# Patient Record
Sex: Male | Born: 1945 | Race: Black or African American | Hispanic: No
Health system: Southern US, Community
[De-identification: ages and names within clinical notes are randomized; demographics above are authoritative.]

## PROBLEM LIST (undated history)

## (undated) DIAGNOSIS — I10 Essential (primary) hypertension: Secondary | ICD-10-CM

## (undated) DIAGNOSIS — E119 Type 2 diabetes mellitus without complications: Secondary | ICD-10-CM

## (undated) DIAGNOSIS — E785 Hyperlipidemia, unspecified: Secondary | ICD-10-CM

## (undated) DIAGNOSIS — K219 Gastro-esophageal reflux disease without esophagitis: Secondary | ICD-10-CM

## (undated) DIAGNOSIS — M1990 Unspecified osteoarthritis, unspecified site: Secondary | ICD-10-CM

## (undated) HISTORY — PX: REPLACEMENT TOTAL KNEE BILATERAL: SUR1225

## (undated) HISTORY — PX: HERNIA REPAIR: SHX51

## (undated) HISTORY — PX: TOTAL KNEE ARTHROPLASTY: SHX125

---

## 1979-08-19 HISTORY — PX: BACK SURGERY: SHX140

## 1999-03-04 ENCOUNTER — Ambulatory Visit (HOSPITAL_BASED_OUTPATIENT_CLINIC_OR_DEPARTMENT_OTHER): Admission: RE | Admit: 1999-03-04 | Discharge: 1999-03-04 | Payer: Self-pay | Admitting: *Deleted

## 2001-08-28 ENCOUNTER — Encounter: Admission: RE | Admit: 2001-08-28 | Discharge: 2001-11-26 | Payer: Self-pay | Admitting: Family Medicine

## 2002-12-24 ENCOUNTER — Encounter: Payer: Self-pay | Admitting: Orthopedic Surgery

## 2002-12-30 ENCOUNTER — Inpatient Hospital Stay (HOSPITAL_COMMUNITY): Admission: RE | Admit: 2002-12-30 | Discharge: 2003-01-05 | Payer: Self-pay | Admitting: Orthopedic Surgery

## 2002-12-30 ENCOUNTER — Encounter: Payer: Self-pay | Admitting: Orthopedic Surgery

## 2010-12-14 ENCOUNTER — Ambulatory Visit (HOSPITAL_COMMUNITY)
Admission: RE | Admit: 2010-12-14 | Discharge: 2010-12-14 | Payer: Self-pay | Source: Home / Self Care | Attending: Family Medicine | Admitting: Family Medicine

## 2010-12-14 ENCOUNTER — Ambulatory Visit (HOSPITAL_COMMUNITY): Admission: RE | Admit: 2010-12-14 | Payer: Self-pay | Source: Home / Self Care | Admitting: Family Medicine

## 2011-01-17 ENCOUNTER — Encounter
Admission: RE | Admit: 2011-01-17 | Discharge: 2011-01-17 | Payer: Self-pay | Source: Home / Self Care | Attending: Family Medicine | Admitting: Family Medicine

## 2011-02-24 ENCOUNTER — Ambulatory Visit (HOSPITAL_COMMUNITY)
Admission: RE | Admit: 2011-02-24 | Discharge: 2011-02-24 | Disposition: A | Discharge status: Home / Self Care | Payer: Medicare Other | Source: Ambulatory Visit | Attending: Orthopedic Surgery | Admitting: Orthopedic Surgery

## 2011-02-24 ENCOUNTER — Other Ambulatory Visit: Payer: Self-pay | Admitting: Orthopedic Surgery

## 2011-02-24 ENCOUNTER — Encounter (HOSPITAL_COMMUNITY)
Admission: RE | Admit: 2011-02-24 | Discharge: 2011-02-24 | Disposition: A | Discharge status: Home / Self Care | Payer: Medicare Other | Source: Ambulatory Visit | Attending: Orthopedic Surgery | Admitting: Orthopedic Surgery

## 2011-02-24 DIAGNOSIS — M1712 Unilateral primary osteoarthritis, left knee: Secondary | ICD-10-CM

## 2011-02-24 DIAGNOSIS — Z01812 Encounter for preprocedural laboratory examination: Secondary | ICD-10-CM | POA: Insufficient documentation

## 2011-02-24 DIAGNOSIS — Z01818 Encounter for other preprocedural examination: Secondary | ICD-10-CM | POA: Insufficient documentation

## 2011-02-24 DIAGNOSIS — I10 Essential (primary) hypertension: Secondary | ICD-10-CM | POA: Insufficient documentation

## 2011-02-24 DIAGNOSIS — M1710 Unilateral primary osteoarthritis, unspecified knee: Secondary | ICD-10-CM | POA: Insufficient documentation

## 2011-02-24 DIAGNOSIS — IMO0002 Reserved for concepts with insufficient information to code with codable children: Secondary | ICD-10-CM | POA: Insufficient documentation

## 2011-02-24 LAB — URINALYSIS, ROUTINE W REFLEX MICROSCOPIC
Glucose, UA: 250 mg/dL — AB
Ketones, ur: NEGATIVE mg/dL
Nitrite: NEGATIVE
Specific Gravity, Urine: 1.014 (ref 1.005–1.030)
pH: 5.5 (ref 5.0–8.0)

## 2011-02-24 LAB — SURGICAL PCR SCREEN
MRSA, PCR: NEGATIVE
Staphylococcus aureus: NEGATIVE

## 2011-02-24 LAB — DIFFERENTIAL
Basophils Absolute: 0.1 10*3/uL (ref 0.0–0.1)
Basophils Relative: 1 % (ref 0–1)
Eosinophils Absolute: 0.4 10*3/uL (ref 0.0–0.7)
Eosinophils Relative: 6 % — ABNORMAL HIGH (ref 0–5)
Lymphocytes Relative: 24 % (ref 12–46)
Lymphs Abs: 1.6 10*3/uL (ref 0.7–4.0)
Monocytes Absolute: 0.5 10*3/uL (ref 0.1–1.0)
Monocytes Relative: 8 % (ref 3–12)
Neutro Abs: 4.1 10*3/uL (ref 1.7–7.7)
Neutrophils Relative %: 61 % (ref 43–77)

## 2011-02-24 LAB — TYPE AND SCREEN
ABO/RH(D): O POS
Antibody Screen: NEGATIVE

## 2011-02-24 LAB — BASIC METABOLIC PANEL
BUN: 14 mg/dL (ref 6–23)
Chloride: 99 mEq/L (ref 96–112)
GFR calc non Af Amer: >60 mL/min (ref >60)
Potassium: 3.6 mEq/L (ref 3.5–5.1)
Sodium: 134 mEq/L — ABNORMAL LOW (ref 135–145)

## 2011-02-24 LAB — CBC
HCT: 45 % (ref 39.0–52.0)
Hemoglobin: 15.6 g/dL (ref 13.0–17.0)
MCHC: 34.7 g/dL (ref 30.0–36.0)
MCV: 88.6 fL (ref 78.0–100.0)
RDW: 13.3 % (ref 11.5–15.5)

## 2011-02-25 LAB — URINE CULTURE: Culture: NO GROWTH

## 2011-02-28 ENCOUNTER — Inpatient Hospital Stay (HOSPITAL_COMMUNITY)
Admission: RE | Admit: 2011-02-28 | Discharge: 2011-03-06 | DRG: 470 | Disposition: A | Discharge status: Home Health | Payer: Medicare Other | Source: Ambulatory Visit | Attending: Orthopedic Surgery | Admitting: Orthopedic Surgery

## 2011-02-28 ENCOUNTER — Inpatient Hospital Stay (HOSPITAL_COMMUNITY): Payer: Medicare Other

## 2011-02-28 DIAGNOSIS — E669 Obesity, unspecified: Secondary | ICD-10-CM | POA: Diagnosis present

## 2011-02-28 DIAGNOSIS — E119 Type 2 diabetes mellitus without complications: Secondary | ICD-10-CM | POA: Diagnosis present

## 2011-02-28 DIAGNOSIS — I10 Essential (primary) hypertension: Secondary | ICD-10-CM | POA: Diagnosis present

## 2011-02-28 DIAGNOSIS — M1710 Unilateral primary osteoarthritis, unspecified knee: Principal | ICD-10-CM | POA: Diagnosis present

## 2011-02-28 DIAGNOSIS — F17200 Nicotine dependence, unspecified, uncomplicated: Secondary | ICD-10-CM | POA: Diagnosis present

## 2011-02-28 LAB — GLUCOSE, CAPILLARY
Glucose-Capillary: 196 mg/dL — ABNORMAL HIGH (ref 70–99)
Glucose-Capillary: 291 mg/dL — ABNORMAL HIGH (ref 70–99)
Glucose-Capillary: 239 mg/dL — ABNORMAL HIGH (ref 70–99)
Glucose-Capillary: 235 mg/dL — ABNORMAL HIGH (ref 70–99)

## 2011-03-01 LAB — BASIC METABOLIC PANEL
BUN: 10 mg/dL (ref 6–23)
CO2: 26 mEq/L (ref 19–32)
Chloride: 100 mEq/L (ref 96–112)
Creatinine, Ser: 0.86 mg/dL (ref 0.4–1.5)
Potassium: 3.9 mEq/L (ref 3.5–5.1)

## 2011-03-01 LAB — CBC
HCT: 36.2 % — ABNORMAL LOW (ref 39.0–52.0)
Hemoglobin: 12.5 g/dL — ABNORMAL LOW (ref 13.0–17.0)
MCH: 31.3 pg (ref 26.0–34.0)
MCV: 90.7 fL (ref 78.0–100.0)
Platelets: 216 10*3/uL (ref 150–400)
RBC: 3.99 MIL/uL — ABNORMAL LOW (ref 4.22–5.81)
WBC: 8.7 10*3/uL (ref 4.0–10.5)

## 2011-03-01 LAB — GLUCOSE, CAPILLARY
Glucose-Capillary: 240 mg/dL — ABNORMAL HIGH (ref 70–99)
Glucose-Capillary: 254 mg/dL — ABNORMAL HIGH (ref 70–99)
Glucose-Capillary: 222 mg/dL — ABNORMAL HIGH (ref 70–99)

## 2011-03-01 LAB — PROTIME-INR: Prothrombin Time: 14.2 seconds (ref 11.6–15.2)

## 2011-03-02 LAB — CBC
MCH: 31.1 pg (ref 26.0–34.0)
MCHC: 34.1 g/dL (ref 30.0–36.0)
Platelets: 199 10*3/uL (ref 150–400)
RBC: 3.8 MIL/uL — ABNORMAL LOW (ref 4.22–5.81)

## 2011-03-02 LAB — BASIC METABOLIC PANEL
CO2: 28 mEq/L (ref 19–32)
Calcium: 8.5 mg/dL (ref 8.4–10.5)
Creatinine, Ser: 0.75 mg/dL (ref 0.4–1.5)
GFR calc Af Amer: >60 mL/min (ref >60)
GFR calc non Af Amer: >60 mL/min (ref >60)
Glucose, Bld: 171 mg/dL — ABNORMAL HIGH (ref 70–99)

## 2011-03-02 LAB — GLUCOSE, CAPILLARY: Glucose-Capillary: 147 mg/dL — ABNORMAL HIGH (ref 70–99)

## 2011-03-02 LAB — PROTIME-INR
INR: 1.32 (ref 0.00–1.49)
Prothrombin Time: 16.6 seconds — ABNORMAL HIGH (ref 11.6–15.2)

## 2011-03-03 LAB — PROTIME-INR: INR: 1.94 — ABNORMAL HIGH (ref 0.00–1.49)

## 2011-03-03 LAB — GLUCOSE, CAPILLARY
Glucose-Capillary: 98 mg/dL (ref 70–99)
Glucose-Capillary: 87 mg/dL (ref 70–99)

## 2011-03-03 LAB — CBC
HCT: 32.1 % — ABNORMAL LOW (ref 39.0–52.0)
MCHC: 34.9 g/dL (ref 30.0–36.0)
Platelets: 200 10*3/uL (ref 150–400)
RDW: 13.5 % (ref 11.5–15.5)

## 2011-03-04 LAB — GLUCOSE, CAPILLARY
Glucose-Capillary: 124 mg/dL — ABNORMAL HIGH (ref 70–99)
Glucose-Capillary: 112 mg/dL — ABNORMAL HIGH (ref 70–99)
Glucose-Capillary: 108 mg/dL — ABNORMAL HIGH (ref 70–99)

## 2011-03-05 LAB — GLUCOSE, CAPILLARY: Glucose-Capillary: 123 mg/dL — ABNORMAL HIGH (ref 70–99)

## 2011-03-05 LAB — PROTIME-INR: Prothrombin Time: 25.6 seconds — ABNORMAL HIGH (ref 11.6–15.2)

## 2011-03-06 LAB — PROTIME-INR
INR: 2.25 — ABNORMAL HIGH (ref 0.00–1.49)
Prothrombin Time: 25 seconds — ABNORMAL HIGH (ref 11.6–15.2)

## 2011-03-16 NOTE — Op Note (Signed)
NAME:  Bobby Maxwell, Bobby Maxwell NO.:  [id number]  MEDICAL RECORD NO.:  [id number]           PATIENT TYPE:  I  LOCATION:  5016                         FACILITY:  [facility]  PHYSICIAN:  Burnard Bunting, M.D.    DATE OF BIRTH:  1946-04-14  DATE OF PROCEDURE:  02/28/2011 DATE OF DISCHARGE:                              OPERATIVE REPORT   PREOPERATIVE DIAGNOSIS:  Left knee arthritis.  POSTOPERATIVE DIAGNOSIS:  Left knee arthritis.  PROCEDURE:  Left total knee replacement.  SURGEON:  Burnard Bunting, MD  ASSISTANT:  Wende Neighbors, PA  ANESTHESIA:  General endotracheal.  ESTIMATED BLOOD LOSS:  100.  TOURNIQUET TIME:  One hour and 57 minutes at 300 mmHg.  INDICATIONS:  Bobby Maxwell is a 65 year old patient with left knee pain, presents for operative management after failure of conservative management and explanation of risks and benefits.  COMPONENTS UTILIZED:  DePuy computer-assisted PCL sacrificing 5 tibia, 4narrow femur, 12.5 poly, 38 patella.  PROCEDURE IN DETAIL:  The patient brought to operating room where general endotracheal anesthesia was induced.  Preoperative antibiotics were administered.  Time-out was called.  Left knee area was prescrubbed with alcohol and Betadine, allowed to air dry, then prepped with DuraPrep solution, draped in a sterile manner.  [name] was used to cover the operative field.  Leg was elevated and examined with the Esmarch. Tourniquet was inflated to 300 mmHg.  Anterior approach to the knee was used.  Skin and subcutaneous tissues were sharply divided.  Median parapatellar approach was made.  Precise location was marked with #1 Vicryl suture.  Patella was everted with some difficulty due to tightness in the lateral retinacular structures.  The lateral patellofemoral ligament was released.  Fat pad was partially excised. The superficial MCL sleeve of tissue on the proximal medial tibia was released because of the patient's preop varus  alignment.  Soft tissue from the anterior distal femur while preserving the periosteum was also resected.  ACL and PCL were released.  Osteophytes were removed.  Two pins were placed in the distal medial femur and proximal medial tibia. Registration points were achieved including the hips in rotation, bimalleolar axis in multiple points around the knee.  The patient had preoperatively about 8 degrees of varus and a 6-degree flexion contracture.  With the collaterals and posterior structures protected, the tibial cut was made perpendicular on the mechanical axis. Tensioning device was placed in both flexion and extension.  Distal femoral cut was then made along with the chamfer cuts and box cuts. Tibia was keel punched.  Patella was then prepared freehand.  The patella was 24 mm thick, 10-mm resection was made, and a 38 patella button was placed.  With the trial components in position, the patient had excellent stability and excellent alignment.  With the 12.5 spacer, he achieved about 100 degree and half of extension with neutral alignment.  Patellar tracking was off because of the tight lateral retinacular structures.  This required a lateral release.  With the lateral release, the patellar tracked well with no thumbs technique. Trial components were thus removed.  Irrigation with 3 L of irrigating  solution was performed.  True components were then cemented into position, 5 tibia, 4 narrow femur, 12.5 poly, 38 patella.  Stability and range of motion parameters were maintained.  Excess cement was removed. Tourniquet was released.  Bleeding points were encountered and controlled with electrocautery.  Hemovac drain was placed.  Thorough irrigation was again performed.  Median parapatellar approach was then closed using #1 Vicryl suture.  Initial closure was performed over a bolster with the precise reapproximation of the extensor mechanism. Skin was closed using interrupted inverted 0  Vicryl sutures and skin staples.  The nylons were used to close the incisions for the pins.  At this time, a solution of Marcaine, morphine, clonidine was injected into the knee.  The patient was placed into a knee immobilizer after bulky wrap was placed.  The patient had good perfusion of the foot following surgery.  The patient tolerated the procedure well without immediate complication.  [name]'s assistance was required at all times during the case for opening, closing, retraction of neurovascular structures, sawing, excess cement removal.  Her assistance was a medical necessity throughout the case.     Burnard Bunting, M.D.     GSD/MEDQ  D:  02/28/2011  T:  03/01/2011  Job:  [id number]  Electronically Signed by Reece Agar.  [name] M.D. on 03/16/2011 08:58:09 AM

## 2011-04-25 NOTE — Discharge Summary (Signed)
NAME:  Bobby Maxwell, Bobby Maxwell NO.:  [id number]  MEDICAL RECORD NO.:  [id number]           PATIENT TYPE:  I  LOCATION:  5016                         FACILITY:  [facility]  PHYSICIAN:  Burnard Bunting, M.D.    DATE OF BIRTH:  Oct 18, 1946  DATE OF ADMISSION:  02/28/2011 DATE OF DISCHARGE:  03/06/2011                              DISCHARGE SUMMARY   ADMISSION DIAGNOSES: 1. Left knee osteoarthritis. 2. Diabetes mellitus. 3. Hypertension.  DISCHARGE DIAGNOSES: 1. Left knee osteoarthritis. 2. Diabetes mellitus. 3. Hypertension.  PROCEDURE:  On February 28, 2011, the patient underwent left total knee replacement performed by Dr. August Saucer, assisted by Maud Deed, PA-C, under general anesthesia.  CONSULTATIONS:  None.  BRIEF HISTORY:  The patient is a 65 year old male with left knee pain, which has failed conservative management.  It was felt he would benefit from surgical intervention and was admitted for the procedure as stated above.  BRIEF HOSPITAL COURSE:  The patient tolerated the procedure under general anesthesia without complications.  Postoperatively, neurovascular motor function of the lower extremities was noted to be intact.  The patient's dressing change was done daily and his wound was healing without signs of infection.  The patient was slow to progress with his physical therapy, however, did not feel he desired inpatient rehabilitation.  Physical Therapy continued to work with him until he reached independent state and could be discharged to his home.  The patient had-had had previous surgery and was able to be discharged to his home.  The patient was placed on Coumadin for DVT prophylaxis. Adjustments in Coumadin made according to daily pro-times.  The patient did have several episodes of nausea and vomiting, which were relieved with Zofran.  Pain was controlled with p.o. analgesics following IV analgesics initially.  On March 06, 2011, the patient was stable  for discharge to his home.  At that time, he was ambulating with physical therapy as much as 100 feet.  He had participated with occupational therapy and was independent with ADLs.  PERTINENT LABORATORY VALUES:  Admission labs show hemoglobin 12.5, hematocrit 36.2, hemoglobin and hematocrit dropped to 11.2 and 32.1. INR at discharge 2.25.  Chemistry studies on admission with sodium 133, glucose 244.  Repeat on March 02, 2011, sodium 133, glucose 171.  PLAN:  The patient was discharged to his home.  Arrangements were made for durable medical equipment.  [facility] for home health physical therapy and Coumadin management.  The patient will continue on home medications as taken prior to admission and medication reconciliation form was given to the patient.  Prescriptions include Percocet 10/325 one every 3-4 hours as needed for pain, Robaxin 500 mg one every 8 hours as needed for spasm, Coumadin 5 mg daily, senna one p.o. b.i.d. p.r.n. constipation.  The patient was encouraged to remain on a diabetic diet.  He will continue ambulating, weightbearing as tolerated.  He will keep his wound dry and covered.  He will follow up in the office on March 29 with Dr. August Saucer.  The patient will call the office if there are questions or concerns prior to his return office  visit.  CONDITION ON DISCHARGE:  Stable.     Wende Neighbors, P.A.   ______________________________ Reece Agar. Dorene Grebe, M.D.    SMV/MEDQ  D:  04/04/2011  T:  04/05/2011  Job:  [id number]  Electronically Signed by Maud Deed P.A. on 04/05/2011 09:41:39 AM Electronically Signed by Reece Agar.  DEAN M.D. on 04/25/2011 01:28:04 PM

## 2011-05-05 NOTE — Op Note (Signed)
NAME:  FIELDS, OROS NO.:  [id number]   MEDICAL RECORD NO.:  [id number]                   PATIENT TYPE:  INP   LOCATION:  5034                                 FACILITY:  [facility]   PHYSICIAN:  Burnard Bunting, M.D.                 DATE OF BIRTH:  1946/11/18   DATE OF PROCEDURE:  12/30/2001  DATE OF DISCHARGE:                                 OPERATIVE REPORT   PREOPERATIVE DIAGNOSIS:  Right knee arthritis.   POSTOPERATIVE DIAGNOSIS:  Right knee arthritis.   PROCEDURE:  Right total knee arthroplasty.   SURGEON:  Eric L. August Saucer, M.D.   ANESTHESIA:  General endotracheal plus preoperative femoral nerve block.   ESTIMATED BLOOD LOSS:  150 cubic centimeters.   DRAINS:  Hemovac x1.   COMPONENTS:  Cemented 9 femur, 9 tibia, 10 mm insert, size 7 patella,  resurfaced.   TOURNIQUET TIME:  Approximately two hours at 300 mmHg.   DESCRIPTION OF PROCEDURE:  The patient was brought to the operating room,  where general endotracheal anesthesia was induced, preoperative IV  antibiotics were administered.  The right leg was prepped with Duraprep  solution and draped in a sterile manner.  [name] was used to cover the  operative field.  The leg was elevated and exsanguinated with the Esmarch  wrap.  The tourniquet was inflated.  With the knee slightly flexed under a  bolster, anterior incision was made.  The skin and subcutaneous tissue were  sharply divided.  A median parapatellar arthrotomy was made and a 0 Vicryl  suture was used to mark the anatomic location of the split of the extensor  mechanism slightly superior and medial to the patella.  The lateral  patellofemoral ligament was released, osteophytes were released, medial  collateral ligament was superficially released around to the semimembranosus  bursa.  Medial osteophytes were removed, the patella was everted, and the  knee was flexed.  ACL and PCL were released at this time.  The rongeur was  used to  find the entry point of the femoral canal.  This was followed by a  drill and placement of the distal femoral cutting block.  A 12 mm resection  was made.  It was made with the oscillating saw with the collateral  ligaments well-protected.  The sizing guide was then placed and the best  size for the components was a size 9.  Rotational alignment was made by  rotating the implant parallel to the transepicondylar axis.  Size 9 chamfer  cutting guide was then placed and with the collateral ligaments protected,  the anterior, posterior, and chamfer cuts were performed.  At this time a  leveling cut on the tibia was performed, a size 9 base plate was found to  fit well over the tibia.  Intramedullary alignment was utilized.  An  appropriate cut was made resecting 4 mm from the most affected medial  side.  The 10 mm resection was made from the least-affected lateral side.  Flexion  and extension gaps were checked at this time and found to be symmetric.  At  this time the box cut on the femur was made.  Trial components were placed,  and the stability was found to be excellent in extension and mid-flexion and  9 degrees of flexion.  Patellar tracking was excellent with no-thumbs  technique.  Collateral ligaments were stable.  No lift-off was noted.  Flex  inserts were utilized.  At this time the keel punch was performed on the  tibia.  Osteophytes were carefully removed from the posterior medial and  posterolateral femoral condyles.  The patella was then free-hand cut.  Caliper measurement was 24 mm.  A 10 mm resection was made.  Size 7 trial  fit nicely with good bony support circumferentially.  At this time trial  reduction was again performed, and the leg was found to come out to full  extension, full flexion, with no lift-off and good patellar tracking with no-  thumbs technique.  At this time trial components were removed.  The incision  was irrigated thoroughly using pulsatile lavage.   Components were then  cemented into position, a size 9 femur, size 9 tibia, with a 10 mm spacer  and a size 7 patella.  Excess cement was removed.  Cement was allowed to  harden.  The knee was again taken through a range of motion and found to  have full extension, full flexion, with good patellar tracking and stable  ligaments in extension, mid-flexion, and full flexion.  The tourniquet was  released at this time.  Bleeding points encountered were controlled using  electrocautery.  A Hemovac drain was placed.  The arthrotomy was closed  using a combination of 0 Vicryl and #1 Vicryl sutures.  Skin was closed  using interrupted, inverted 2-0 Vicryl and skin staples.  The knee was then  injected with 1.1 cubic centimeters of Duracon, 8 mg of morphine, and 20 mg  of 0.25% Marcaine without epinephrine.  A bulky dressing and knee  immobilizer was placed.  The patient tolerated the procedure well without  any immediate complications.                                                Burnard Bunting, M.D.    GSD/MEDQ  D:  01/01/2003  T:  01/01/2003  Job:  [id number]

## 2011-05-05 NOTE — Discharge Summary (Signed)
   NAME:  Bobby Maxwell, Bobby Maxwell NO.:  [id number]   MEDICAL RECORD NO.:  [id number]                   PATIENT TYPE:  INP   LOCATION:  1610                                 FACILITY:  [facility]   PHYSICIAN:  Burnard Bunting, M.D.                 DATE OF BIRTH:  11-05-1946   DATE OF ADMISSION:  12/30/2002  DATE OF DISCHARGE:  01/05/2003                                 DISCHARGE SUMMARY   DISCHARGE DIAGNOSIS:  Right knee arthritis.   OPERATIONS AND PROCEDURES:  Right total knee replacement.   HISTORY OF PRESENT ILLNESS:  Mr. Bobby Maxwell is a 65 year old white male  with right knee arthritis who presents for right total knee arthroplasty  after failing conservative measures.   HOSPITAL COURSE:  The patient was admitted to the orthopedic service on  12/30/02.  He underwent right total knee arthroplasty at that time.  The  patient tolerated the procedure well without any complications, he was  transferred to the recovery room in stable condition.  His right foot was  sensate, perfused and mobile on postoperative day number one.  Hematocrit  was 33 on postoperative day number two.  The patient was started on Coumadin  for DVT prophylaxis and started on CPM and physical therapy for range of  motion and strengthening.  His incision was intact on postoperative day  number three.  He was mobilizing well at the time of discharge.  He was  afebrile with an intact incision on 01/05/03.  At that time he was  discharged.   DISCHARGE MEDICATIONS:  Coumadin, Percocet and Robaxin for pain as well as  his previous medications.   FOLLOW UP:  He will follow up with me in one week to check on the incision.                                               Burnard Bunting, M.D.    GSD/MEDQ  D:  02/17/2003  T:  02/18/2003  Job:  [id number]

## 2014-06-14 ENCOUNTER — Encounter (HOSPITAL_COMMUNITY): Payer: Self-pay | Admitting: Emergency Medicine

## 2014-06-14 ENCOUNTER — Emergency Department (HOSPITAL_COMMUNITY)
Admission: EM | Admit: 2014-06-14 | Discharge: 2014-06-14 | Disposition: A | Discharge status: Home / Self Care | Payer: Worker's Compensation | Attending: Emergency Medicine | Admitting: Emergency Medicine

## 2014-06-14 ENCOUNTER — Emergency Department (HOSPITAL_COMMUNITY): Payer: Worker's Compensation

## 2014-06-14 DIAGNOSIS — H538 Other visual disturbances: Secondary | ICD-10-CM | POA: Diagnosis not present

## 2014-06-14 DIAGNOSIS — Z23 Encounter for immunization: Secondary | ICD-10-CM | POA: Insufficient documentation

## 2014-06-14 DIAGNOSIS — Y929 Unspecified place or not applicable: Secondary | ICD-10-CM | POA: Insufficient documentation

## 2014-06-14 DIAGNOSIS — Z79899 Other long term (current) drug therapy: Secondary | ICD-10-CM | POA: Insufficient documentation

## 2014-06-14 DIAGNOSIS — S199XXA Unspecified injury of neck, initial encounter: Secondary | ICD-10-CM | POA: Diagnosis present

## 2014-06-14 DIAGNOSIS — Z7982 Long term (current) use of aspirin: Secondary | ICD-10-CM | POA: Insufficient documentation

## 2014-06-14 DIAGNOSIS — IMO0002 Reserved for concepts with insufficient information to code with codable children: Secondary | ICD-10-CM | POA: Insufficient documentation

## 2014-06-14 DIAGNOSIS — E119 Type 2 diabetes mellitus without complications: Secondary | ICD-10-CM | POA: Diagnosis not present

## 2014-06-14 DIAGNOSIS — Y9389 Activity, other specified: Secondary | ICD-10-CM | POA: Insufficient documentation

## 2014-06-14 DIAGNOSIS — S0990XA Unspecified injury of head, initial encounter: Secondary | ICD-10-CM

## 2014-06-14 DIAGNOSIS — S0993XA Unspecified injury of face, initial encounter: Secondary | ICD-10-CM | POA: Diagnosis present

## 2014-06-14 HISTORY — DX: Type 2 diabetes mellitus without complications: E11.9

## 2014-06-14 LAB — COMPREHENSIVE METABOLIC PANEL
ALT: 28 U/L (ref 0–53)
AST: 23 U/L (ref 0–37)
Albumin: 3.3 g/dL — ABNORMAL LOW (ref 3.5–5.2)
Alkaline Phosphatase: 59 U/L (ref 39–117)
BUN: 14 mg/dL (ref 6–23)
CALCIUM: 9.2 mg/dL (ref 8.4–10.5)
CO2: 24 meq/L (ref 19–32)
Chloride: 99 mEq/L (ref 96–112)
Creatinine, Ser: 0.76 mg/dL (ref 0.50–1.35)
GLUCOSE: 182 mg/dL — AB (ref 70–99)
Potassium: 3.5 mEq/L — ABNORMAL LOW (ref 3.7–5.3)
Sodium: 136 mEq/L — ABNORMAL LOW (ref 137–147)
TOTAL PROTEIN: 6.3 g/dL (ref 6.0–8.3)
Total Bilirubin: <0.2 mg/dL — ABNORMAL LOW (ref 0.3–1.2)

## 2014-06-14 LAB — CBC WITH DIFFERENTIAL/PLATELET
Basophils Absolute: 0 10*3/uL (ref 0.0–0.1)
Basophils Relative: 1 % (ref 0–1)
EOS ABS: 0.4 10*3/uL (ref 0.0–0.7)
EOS PCT: 6 % — AB (ref 0–5)
HEMATOCRIT: 40.1 % (ref 39.0–52.0)
HEMOGLOBIN: 14 g/dL (ref 13.0–17.0)
LYMPHS ABS: 1.8 10*3/uL (ref 0.7–4.0)
LYMPHS PCT: 27 % (ref 12–46)
MCH: 32 pg (ref 26.0–34.0)
MCHC: 34.9 g/dL (ref 30.0–36.0)
MCV: 91.6 fL (ref 78.0–100.0)
MONO ABS: 0.5 10*3/uL (ref 0.1–1.0)
MONOS PCT: 8 % (ref 3–12)
Neutro Abs: 3.8 10*3/uL (ref 1.7–7.7)
Neutrophils Relative %: 58 % (ref 43–77)
Platelets: 193 10*3/uL (ref 150–400)
RBC: 4.38 MIL/uL (ref 4.22–5.81)
RDW: 13.4 % (ref 11.5–15.5)
WBC: 6.5 10*3/uL (ref 4.0–10.5)

## 2014-06-14 LAB — PROTIME-INR
INR: 1.01 (ref 0.00–1.49)
PROTHROMBIN TIME: 13.3 s (ref 11.6–15.2)

## 2014-06-14 LAB — URINALYSIS, ROUTINE W REFLEX MICROSCOPIC
Bilirubin Urine: NEGATIVE
GLUCOSE, UA: NEGATIVE mg/dL
Hgb urine dipstick: NEGATIVE
KETONES UR: NEGATIVE mg/dL
Leukocytes, UA: NEGATIVE
NITRITE: NEGATIVE
PROTEIN: NEGATIVE mg/dL
Specific Gravity, Urine: 1.017 (ref 1.005–1.030)
Urobilinogen, UA: 0.2 mg/dL (ref 0.0–1.0)
pH: 5.5 (ref 5.0–8.0)

## 2014-06-14 LAB — I-STAT TROPONIN, ED: Troponin i, poc: 0.03 ng/mL (ref 0.00–0.08)

## 2014-06-14 LAB — APTT: APTT: 27 s (ref 24–37)

## 2014-06-14 MED ORDER — TETANUS-DIPHTH-ACELL PERTUSSIS 5-2.5-18.5 LF-MCG/0.5 IM SUSP
0.5000 mL | Freq: Once | INTRAMUSCULAR | Status: AC
  Administered 2014-06-14: 0.5 mL via INTRAMUSCULAR
  Filled 2014-06-14: qty 0.5
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
## 2014-06-14 NOTE — ED Notes (Signed)
Per nurse she will draw labs

## 2014-06-14 NOTE — ED Provider Notes (Signed)
CSN: [id number]     Arrival date & time 06/14/14  1826 History   First MD Initiated Contact with Patient 06/14/14 1935     Chief Complaint  Patient presents with  . Facial Injury  . Blurred Vision     (Consider location/radiation/quality/duration/timing/severity/associated sxs/prior Treatment) The history is provided by the patient and medical records.   This is a 68 year old male with PMH significant for DM, presenting to the ED for headache and blurred vision of right eye x4 days.  Pt states at work on Thursday working on a 2 ton machine when he accidentally got hit in the head with a handle of the machine on his right forehead.  No LOC.  Pt states he was evaluated at the medical clinic afterwards, had fluorescein stain of his right eye and sent home.  Pt states hematoma on right forehead has significantly decreased in size, however he continues to have an intermittent headache along the right side of his head and bruising around his right eye has gotten worse.  He states he occasionally has blurred vision in his right eye with some photophobia.  Pt wears reading glasses PRN.  Did have some episodes of lightheadedness yesterday associated with "feeling off balance" but this has since resolved.  He denies any falls secondary to this.  Pt has no prior hx of TIA or stroke.  Not currently on any anti-coagulants.  Headache rated at 3/10 on arrival, VS stable.  Past Medical History  Diagnosis Date  . Diabetes mellitus without complication    Past Surgical History  Procedure Laterality Date  . Total knee arthroplasty     History reviewed. No pertinent family history. History  Substance Use Topics  . Smoking status: Never Smoker   . Smokeless tobacco: Current User  . Alcohol Use: Yes    Review of Systems  Neurological: Positive for headaches.  All other systems reviewed and are negative.     Allergies  Ivp dye  Home Medications   Prior to Admission medications   Medication Sig  Start Date End Date Taking? Authorizing Provider  acetaminophen (TYLENOL) 650 MG CR tablet Take 650 mg by mouth every 8 (eight) hours as needed for pain.   Yes Historical Provider, MD  aspirin EC 81 MG tablet Take 81 mg by mouth daily.   Yes Historical Provider, MD  atorvastatin (LIPITOR) 10 MG tablet Take 10 mg by mouth daily.   Yes Historical Provider, MD  B Complex-C (B-COMPLEX WITH VITAMIN C) tablet Take 1 tablet by mouth daily.   Yes Historical Provider, MD  glipiZIDE (GLUCOTROL XL) 10 MG 24 hr tablet Take 10 mg by mouth daily with breakfast.   Yes Historical Provider, MD  lisinopril-hydrochlorothiazide (PRINZIDE,ZESTORETIC) 20-25 MG per tablet Take 1 tablet by mouth daily.   Yes Historical Provider, MD  loratadine (CLARITIN) 10 MG tablet Take 10 mg by mouth daily as needed for allergies.   Yes Historical Provider, MD  meloxicam (MOBIC) 7.5 MG tablet Take 7.5 mg by mouth daily as needed for pain.   Yes Historical Provider, MD  metFORMIN (GLUCOPHAGE) 500 MG tablet Take 1,000 mg by mouth 2 (two) times daily with a meal.   Yes Historical Provider, MD  Multiple Vitamin (MULTIVITAMIN WITH MINERALS) TABS tablet Take 1 tablet by mouth daily.   Yes Historical Provider, MD  Potassium 99 MG TABS Take 1 tablet by mouth daily.   Yes Historical Provider, MD   BP 139/89  Pulse 76  Temp(Src) 98.3 F (36.8 C) (  Oral)  Resp 20  SpO2 98%  Physical Exam  Nursing note and vitals reviewed. Constitutional: He is oriented to person, place, and time. He appears well-developed and well-nourished. No distress.  HENT:  Head: Normocephalic and atraumatic.  Mouth/Throat: Oropharynx is clear and moist.  Small abrasion to right forehead; locally TTP without bruising or deformity; no significant hematoma noted  Eyes: Conjunctivae, EOM and lids are normal. Pupils are equal, round, and reactive to light. Right conjunctiva is not injected. Right conjunctiva has no hemorrhage.  No noted lid edema; some tenderness of  right superior orbital rim without bony deformities; bruising surrounding right eye; EOM intact; no hemorrhage or conjunctiva injection; EOM fully intact  Neck: Normal range of motion. Neck supple.  Cardiovascular: Normal rate, regular rhythm and normal heart sounds.   Pulmonary/Chest: Effort normal and breath sounds normal. No respiratory distress. He has no wheezes.  Abdominal: Soft. Bowel sounds are normal. There is no tenderness. There is no guarding.  Musculoskeletal: Normal range of motion.  Neurological: He is alert and oriented to person, place, and time. He has normal strength. He displays no tremor. No cranial nerve deficit or sensory deficit. He displays no seizure activity.  AAOx3, answering questions and following commands appropriately; equal strength UE and LE bilaterally; CN grossly intact; moves all extremities appropriately without ataxia; normal coordination; no dysmetria with finger to nose bilaterally; normal rapid alternating movements bilaterally; no focal neuro deficits or facial asymmetry appreciated; normal gait without ataxia  Skin: Skin is warm and dry. He is not diaphoretic.  Psychiatric: He has a normal mood and affect.    ED Course  Procedures (including critical care time) Labs Review Labs Reviewed  CBC WITH DIFFERENTIAL - Abnormal; Notable for the following:    Eosinophils Relative 6 (*)    All other components within normal limits  COMPREHENSIVE METABOLIC PANEL - Abnormal; Notable for the following:    Sodium 136 (*)    Potassium 3.5 (*)    Glucose, Bld 182 (*)    Albumin 3.3 (*)    Total Bilirubin <0.2 (*)    All other components within normal limits  PROTIME-INR  APTT  URINALYSIS, ROUTINE W REFLEX MICROSCOPIC  I-STAT TROPOININ, ED    Imaging Review Ct Head Wo Contrast  06/14/2014   CLINICAL DATA:  Head injury 3 days ago.  EXAM: CT HEAD WITHOUT CONTRAST  CT MAXILLOFACIAL WITHOUT CONTRAST  TECHNIQUE: Multidetector CT imaging of the head and  maxillofacial structures were performed using the standard protocol without intravenous contrast. Multiplanar CT image reconstructions of the maxillofacial structures were also generated.  COMPARISON:  None.  FINDINGS: CT HEAD FINDINGS  No acute cortical infarct, hemorrhage, or mass lesion ispresent. Ventricles are of normal size. No significant extra-axial fluid collection is present. The paranasal sinuses andmastoid air cells are clear. The osseous skull is intact.  CT MAXILLOFACIAL FINDINGS  Mild pan sinus mucosal thickening noted. No fluid levels identified. The mastoid air cells are also clear. The mandible appears located. The orbits are intact. No blow-out fracture.  IMPRESSION: 1. No acute intracranial abnormalities. 2. No evidence for orbital blowout fracture.   Electronically Signed   By: Kerby Moors M.D.   On: 06/14/2014 21:23   Ct Maxillofacial Wo Cm  06/14/2014   CLINICAL DATA:  Head injury 3 days ago.  EXAM: CT HEAD WITHOUT CONTRAST  CT MAXILLOFACIAL WITHOUT CONTRAST  TECHNIQUE: Multidetector CT imaging of the head and maxillofacial structures were performed using the standard protocol without intravenous contrast.  Multiplanar CT image reconstructions of the maxillofacial structures were also generated.  COMPARISON:  None.  FINDINGS: CT HEAD FINDINGS  No acute cortical infarct, hemorrhage, or mass lesion ispresent. Ventricles are of normal size. No significant extra-axial fluid collection is present. The paranasal sinuses andmastoid air cells are clear. The osseous skull is intact.  CT MAXILLOFACIAL FINDINGS  Mild pan sinus mucosal thickening noted. No fluid levels identified. The mastoid air cells are also clear. The mandible appears located. The orbits are intact. No blow-out fracture.  IMPRESSION: 1. No acute intracranial abnormalities. 2. No evidence for orbital blowout fracture.   Electronically Signed   By: Kerby Moors M.D.   On: 06/14/2014 21:23     EKG Interpretation None       MDM   Final diagnoses:  Head injury, initial encounter   68 year old male with head injury at work 5 days ago. No loss of consciousness reported.  Here with intermittent headache and episode of lightheadedness yesterday.  On exam, pt is alert and baseline oriented.  Neuro exam intact without focal deficits.  Visual acuity 20/40 bilaterally.  Will plan for CT head and max/face, labs, u/a.  Pt requests tetanus booster which was given.  CT head and max/face negative for acute findings.  Labs reassuring.  Patient remains baseline oriented without focal deficits.  He has ambulated in the ED without evidence of ataxia.  Low suspicion for TIA, stroke, ICH, SAH, or other acute intracranial injury at this time.  He will be discharged home with close PCP FU.  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  Discussed plan with patient, he/she acknowledged understanding and agreed with plan of care.  Return precautions given for new or worsening symptoms.  Discussed with Dr. Venora Maples who personally evaluated pt and agrees with assessment and plan of care.  Larene Pickett, PA-C 06/15/14 0020

## 2014-06-14 NOTE — ED Notes (Signed)
EKG given to EDP,Jacobuwitz,MD. For review.

## 2014-06-15 NOTE — ED Provider Notes (Signed)
Medical screening examination/treatment/procedure(s) were conducted as a shared visit with non-physician practitioner(s) and myself.  I personally evaluated the patient during the encounter.   EKG Interpretation None      Concussion-like symptoms.  Head CT another imaging negative today.  Close followup with primary care physician.  I've asked patient to followup with his employment health office regarding return to work safely timeline.   Ct Head Wo Contrast  06/14/2014   CLINICAL DATA:  Head injury 3 days ago.  EXAM: CT HEAD WITHOUT CONTRAST  CT MAXILLOFACIAL WITHOUT CONTRAST  TECHNIQUE: Multidetector CT imaging of the head and maxillofacial structures were performed using the standard protocol without intravenous contrast. Multiplanar CT image reconstructions of the maxillofacial structures were also generated.  COMPARISON:  None.  FINDINGS: CT HEAD FINDINGS  No acute cortical infarct, hemorrhage, or mass lesion ispresent. Ventricles are of normal size. No significant extra-axial fluid collection is present. The paranasal sinuses andmastoid air cells are clear. The osseous skull is intact.  CT MAXILLOFACIAL FINDINGS  Mild pan sinus mucosal thickening noted. No fluid levels identified. The mastoid air cells are also clear. The mandible appears located. The orbits are intact. No blow-out fracture.  IMPRESSION: 1. No acute intracranial abnormalities. 2. No evidence for orbital blowout fracture.   Electronically Signed   By: Kerby Moors M.D.   On: 06/14/2014 21:23   Ct Maxillofacial Wo Cm  06/14/2014   CLINICAL DATA:  Head injury 3 days ago.  EXAM: CT HEAD WITHOUT CONTRAST  CT MAXILLOFACIAL WITHOUT CONTRAST  TECHNIQUE: Multidetector CT imaging of the head and maxillofacial structures were performed using the standard protocol without intravenous contrast. Multiplanar CT image reconstructions of the maxillofacial structures were also generated.  COMPARISON:  None.  FINDINGS: CT HEAD FINDINGS  No acute  cortical infarct, hemorrhage, or mass lesion ispresent. Ventricles are of normal size. No significant extra-axial fluid collection is present. The paranasal sinuses andmastoid air cells are clear. The osseous skull is intact.  CT MAXILLOFACIAL FINDINGS  Mild pan sinus mucosal thickening noted. No fluid levels identified. The mastoid air cells are also clear. The mandible appears located. The orbits are intact. No blow-out fracture.  IMPRESSION: 1. No acute intracranial abnormalities. 2. No evidence for orbital blowout fracture.   Electronically Signed   By: Kerby Moors M.D.   On: 06/14/2014 21:23  I personally reviewed the imaging tests through PACS system I reviewed available ER/hospitalization records through the [facility], MD 06/15/14 [phone]

## 2014-06-19 ENCOUNTER — Encounter (HOSPITAL_COMMUNITY): Payer: Self-pay | Admitting: Pharmacy Technician

## 2014-06-29 ENCOUNTER — Encounter (HOSPITAL_COMMUNITY): Payer: Self-pay | Admitting: *Deleted

## 2014-07-03 ENCOUNTER — Other Ambulatory Visit: Payer: Self-pay | Admitting: Gastroenterology

## 2014-07-06 NOTE — Addendum Note (Signed)
Addended by: Wilford Corner on: 07/06/2014 06:43 AM   Modules accepted: Orders

## 2014-07-09 ENCOUNTER — Encounter (HOSPITAL_COMMUNITY)
Admission: RE | Disposition: A | Discharge status: Home / Self Care | Payer: Self-pay | Source: Ambulatory Visit | Attending: Gastroenterology

## 2014-07-09 ENCOUNTER — Encounter (HOSPITAL_COMMUNITY): Payer: Self-pay | Admitting: Anesthesiology

## 2014-07-09 ENCOUNTER — Ambulatory Visit (HOSPITAL_COMMUNITY)
Admission: RE | Admit: 2014-07-09 | Discharge: 2014-07-09 | Disposition: A | Discharge status: Home / Self Care | Payer: Medicare Other | Source: Ambulatory Visit | Attending: Gastroenterology | Admitting: Gastroenterology

## 2014-07-09 ENCOUNTER — Encounter (HOSPITAL_COMMUNITY): Payer: Medicare Other | Admitting: Anesthesiology

## 2014-07-09 ENCOUNTER — Ambulatory Visit (HOSPITAL_COMMUNITY): Payer: Medicare Other | Admitting: Anesthesiology

## 2014-07-09 DIAGNOSIS — K5730 Diverticulosis of large intestine without perforation or abscess without bleeding: Secondary | ICD-10-CM | POA: Diagnosis not present

## 2014-07-09 DIAGNOSIS — K648 Other hemorrhoids: Secondary | ICD-10-CM | POA: Insufficient documentation

## 2014-07-09 DIAGNOSIS — D126 Benign neoplasm of colon, unspecified: Secondary | ICD-10-CM | POA: Insufficient documentation

## 2014-07-09 DIAGNOSIS — I10 Essential (primary) hypertension: Secondary | ICD-10-CM | POA: Insufficient documentation

## 2014-07-09 DIAGNOSIS — K620 Anal polyp: Secondary | ICD-10-CM | POA: Insufficient documentation

## 2014-07-09 DIAGNOSIS — K621 Rectal polyp: Secondary | ICD-10-CM

## 2014-07-09 DIAGNOSIS — E119 Type 2 diabetes mellitus without complications: Secondary | ICD-10-CM | POA: Insufficient documentation

## 2014-07-09 HISTORY — PX: HOT HEMOSTASIS: SHX5433

## 2014-07-09 HISTORY — DX: Essential (primary) hypertension: I10

## 2014-07-09 HISTORY — PX: COLONOSCOPY: SHX5424

## 2014-07-09 LAB — GLUCOSE, CAPILLARY: Glucose-Capillary: 142 mg/dL — ABNORMAL HIGH (ref 70–99)

## 2014-07-09 SURGERY — COLONOSCOPY
Anesthesia: Monitor Anesthesia Care

## 2014-07-09 MED ORDER — MIDAZOLAM HCL 5 MG/5ML IJ SOLN
INTRAMUSCULAR | Status: DC | PRN
  Administered 2014-07-09 (×2): 1 mg via INTRAVENOUS

## 2014-07-09 MED ORDER — PROPOFOL INFUSION 10 MG/ML OPTIME
INTRAVENOUS | Status: DC | PRN
  Administered 2014-07-09: 140 ug/kg/min via INTRAVENOUS

## 2014-07-09 MED ORDER — PROPOFOL 10 MG/ML IV BOLUS
INTRAVENOUS | Status: AC
  Filled 2014-07-09: qty 20

## 2014-07-09 MED ORDER — LIDOCAINE HCL (CARDIAC) 20 MG/ML IV SOLN
INTRAVENOUS | Status: DC | PRN
  Administered 2014-07-09: 100 mg via INTRAVENOUS

## 2014-07-09 MED ORDER — LACTATED RINGERS IV SOLN
INTRAVENOUS | Status: DC | PRN
  Administered 2014-07-09: 08:00:00 via INTRAVENOUS

## 2014-07-09 MED ORDER — ONDANSETRON HCL 4 MG/2ML IJ SOLN
INTRAMUSCULAR | Status: AC
  Filled 2014-07-09: qty 2

## 2014-07-09 MED ORDER — LIDOCAINE HCL (CARDIAC) 20 MG/ML IV SOLN
INTRAVENOUS | Status: AC
  Filled 2014-07-09: qty 5

## 2014-07-09 MED ORDER — MIDAZOLAM HCL 2 MG/2ML IJ SOLN
INTRAMUSCULAR | Status: AC
  Filled 2014-07-09: qty 2

## 2014-07-09 MED ORDER — SODIUM CHLORIDE 0.9 % IV SOLN: INTRAVENOUS | Status: DC

## 2014-07-09 MED ORDER — ONDANSETRON HCL 4 MG/2ML IJ SOLN
INTRAMUSCULAR | Status: DC | PRN
  Administered 2014-07-09: 4 mg via INTRAVENOUS

## 2014-07-09 MED ORDER — KETAMINE HCL 10 MG/ML IJ SOLN
INTRAMUSCULAR | Status: DC | PRN
  Administered 2014-07-09: 20 mg via INTRAVENOUS

## 2014-07-09 NOTE — Discharge Instructions (Signed)
No aspirin products for 2 weeks. Colonoscopy A colonoscopy is an exam to look at your colon. This exam can help find lumps (tumors), growths (polyps), bleeding, and redness and puffiness (inflammation) in your colon.  BEFORE THE PROCEDURE  Ask your doctor about changing or stopping your regular medicines.  You may need to drink a large amount of a special liquid (oral bowel prep). You start drinking this the day before your procedure. It will cause you to have watery poop (stool). This cleans out your colon.  Do not eat or drink anything else once you have started the bowel prep, unless your doctor tells you it is safe to do so.  Make plans for someone to drive you home after the procedure. PROCEDURE  You will be given medicine to help you relax (sedative).  You will lie on your side with your knees bent.  A tube with a camera on the end is put in the opening of your butt (anus) and into your colon. Pictures are sent to a computer screen. Your doctor will look for anything that is not normal.  Your doctor may take a tissue sample (biopsy) from your colon to be looked at more closely.  The exam is finished when your doctor has viewed all of the colon. AFTER THE PROCEDURE  Do not drive for 24 hours after the exam.  You may have a small amount of blood in your poop. This is normal.  You may pass gas and have belly (abdominal) cramps. This is normal.  Ask when your test results will be ready. Make sure you get your test results. Document Released: 01/06/2011 Document Revised: 12/09/2013 Document Reviewed: 08/11/2013 [facility] Patient Information 2015 [place], [place]. This information is not intended to replace advice given to you by your health care provider. Make sure you discuss any questions you have with your health care provider.

## 2014-07-09 NOTE — Op Note (Signed)
[facility] [place], [address]   COLONOSCOPY PROCEDURE REPORT  PATIENT: Bobby, Maxwell  MR#: [id number] BIRTHDATE: 1946/08/09 , 34  yrs. old GENDER: Male ENDOSCOPIST: Wilford Corner, MD REFERRED BY: PROCEDURE DATE:  07/09/2014 PROCEDURE:   Colonoscopy with snare polypectomy ASA CLASS:   Class III INDICATIONS:history of adenomatous colon polyps. MEDICATIONS: See Anesthesia Report.  DESCRIPTION OF PROCEDURE:   After the risks benefits and alternatives of the procedure were thoroughly explained, informed consent was obtained.  The Pentax Ped Colon [id number]  endoscope was introduced through the anus and advanced to the cecum, which was identified by both the appendix and ileocecal valve , limited by No adverse events experienced.   The quality of the prep was fair. . The instrument was then slowly withdrawn as the colon was fully examined.     FINDINGS:  Rectal exam unremarkable.  Pediatric colonoscope inserted into the colon and advanced to the cecum, where the appendiceal orifice and ileocecal valve were identified. During insertion the previous tattooed site was seen and a 1 cm residual polyp was noted.   On careful withdrawal of the colonoscope the residual polyp was again seen and the polyp was removed with snare cautery. 4 sessile polyps (5-65mm) were removed in the sigmoid colon and 2 could not be retrieved following snare cautery. Sigmoid diverticulosis was noted. A 6 mm rectal polyp was removed with snare cautery.   Retroflexion revealed small internal hemorrhoids.  COMPLICATIONS: None  IMPRESSION:     Colon polyps X 6 -see above Sigmoid diverticulosis Internal hemorrhoids  RECOMMENDATIONS: F/U on path; No aspirin products for 2 weeks; High fiber diet    ______________________________ eSignedWilford Corner, MD 07/09/2014 9:45 AM   CC:

## 2014-07-09 NOTE — H&P (Signed)
  Date of Initial H&P: 07/03/14  History reviewed, patient examined, no change in status, stable for surgery.

## 2014-07-09 NOTE — Interval H&P Note (Signed)
History and Physical Interval Note:  07/09/2014 8:44 AM  Bobby Maxwell  has presented today for surgery, with the diagnosis of colon polyp  The various methods of treatment have been discussed with the patient and family. After consideration of risks, benefits and other options for treatment, the patient has consented to  Procedure(s): COLONOSCOPY (N/A) HOT HEMOSTASIS (ARGON PLASMA COAGULATION/BICAP) (N/A) as a surgical intervention .  The patient's history has been reviewed, patient examined, no change in status, stable for surgery.  I have reviewed the patient's chart and labs.  Questions were answered to the patient's satisfaction.     Kimberly C.

## 2014-07-09 NOTE — Transfer of Care (Signed)
Immediate Anesthesia Transfer of Care Note  Patient: Bobby Maxwell  Procedure(s) Performed: Procedure(s): COLONOSCOPY (N/A) HOT HEMOSTASIS (ARGON PLASMA COAGULATION/BICAP) (N/A)  Patient Location: PACU and Endoscopy Unit  Anesthesia Type:MAC  Level of Consciousness: awake, alert  and oriented  Airway & Oxygen Therapy: Patient Spontanous Breathing and Patient connected to face mask oxygen  Post-op Assessment: Report given to PACU RN and Post -op Vital signs reviewed and stable  Post vital signs: Reviewed and stable  Complications: No apparent anesthesia complications

## 2014-07-09 NOTE — Anesthesia Preprocedure Evaluation (Signed)
Anesthesia Evaluation  Patient identified by MRN, date of birth, ID band Patient awake    Reviewed: Allergy & Precautions, H&P , NPO status , Patient's Chart, lab work & pertinent test results  Airway Mallampati: II TM Distance: >3 FB Neck ROM: Full    Dental no notable dental hx.    Pulmonary neg pulmonary ROS,  breath sounds clear to auscultation  Pulmonary exam normal       Cardiovascular hypertension, Pt. on medications Rhythm:Regular Rate:Normal     Neuro/Psych negative neurological ROS  negative psych ROS   GI/Hepatic negative GI ROS, Neg liver ROS,   Endo/Other  diabetes, Type 2, Oral Hypoglycemic Agents  Renal/GU negative Renal ROS  negative genitourinary   Musculoskeletal negative musculoskeletal ROS (+)   Abdominal   Peds negative pediatric ROS (+)  Hematology negative hematology ROS (+)   Anesthesia Other Findings   Reproductive/Obstetrics negative OB ROS                           Anesthesia Physical Anesthesia Plan  ASA: III  Anesthesia Plan: MAC   Post-op Pain Management:    Induction: Intravenous  Airway Management Planned:   Additional Equipment:   Intra-op Plan:   Post-operative Plan:   Informed Consent: I have reviewed the patients History and Physical, chart, labs and discussed the procedure including the risks, benefits and alternatives for the proposed anesthesia with the patient or authorized representative who has indicated his/her understanding and acceptance.   Dental advisory given  Plan Discussed with: CRNA  Anesthesia Plan Comments:         Anesthesia Quick Evaluation

## 2014-07-09 NOTE — Anesthesia Postprocedure Evaluation (Signed)
  Anesthesia Post-op Note  Patient: Bobby Maxwell  Procedure(s) Performed: Procedure(s) (LRB): COLONOSCOPY (N/A) HOT HEMOSTASIS (ARGON PLASMA COAGULATION/BICAP) (N/A)  Patient Location: PACU  Anesthesia Type: MAC  Level of Consciousness: awake and alert   Airway and Oxygen Therapy: Patient Spontanous Breathing  Post-op Pain: mild  Post-op Assessment: Post-op Vital signs reviewed, Patient's Cardiovascular Status Stable, Respiratory Function Stable, Patent Airway and No signs of Nausea or vomiting  Last Vitals:  Filed Vitals:   07/09/14 1000  BP: 126/72  Pulse: 60  Temp:   Resp: 16    Post-op Vital Signs: stable   Complications: No apparent anesthesia complications

## 2014-07-10 ENCOUNTER — Encounter (HOSPITAL_COMMUNITY): Payer: Self-pay | Admitting: Gastroenterology

## 2014-09-08 ENCOUNTER — Other Ambulatory Visit (HOSPITAL_COMMUNITY): Payer: Self-pay | Admitting: Orthopedic Surgery

## 2014-09-08 DIAGNOSIS — M25569 Pain in unspecified knee: Secondary | ICD-10-CM

## 2014-09-15 ENCOUNTER — Ambulatory Visit (HOSPITAL_COMMUNITY)
Admission: RE | Admit: 2014-09-15 | Discharge: 2014-09-15 | Disposition: A | Discharge status: Home / Self Care | Payer: Medicare Other | Source: Ambulatory Visit | Attending: Orthopedic Surgery | Admitting: Orthopedic Surgery

## 2014-09-15 ENCOUNTER — Encounter (HOSPITAL_COMMUNITY)
Admission: RE | Admit: 2014-09-15 | Discharge: 2014-09-15 | Disposition: A | Discharge status: Home / Self Care | Payer: Medicare Other | Source: Ambulatory Visit | Attending: Orthopedic Surgery | Admitting: Orthopedic Surgery

## 2014-09-15 DIAGNOSIS — M25569 Pain in unspecified knee: Secondary | ICD-10-CM | POA: Diagnosis not present

## 2014-09-15 DIAGNOSIS — Z96659 Presence of unspecified artificial knee joint: Secondary | ICD-10-CM | POA: Insufficient documentation

## 2014-09-15 MED ORDER — TECHNETIUM TC 99M MEDRONATE IV KIT
26.5000 | PACK | Freq: Once | INTRAVENOUS | Status: AC | PRN
  Administered 2014-09-15: 26.5 via INTRAVENOUS

## 2015-01-01 DIAGNOSIS — M71572 Other bursitis, not elsewhere classified, left ankle and foot: Secondary | ICD-10-CM | POA: Diagnosis not present

## 2015-01-01 DIAGNOSIS — M76822 Posterior tibial tendinitis, left leg: Secondary | ICD-10-CM | POA: Diagnosis not present

## 2015-01-01 DIAGNOSIS — M722 Plantar fascial fibromatosis: Secondary | ICD-10-CM | POA: Diagnosis not present

## 2015-01-01 DIAGNOSIS — M7732 Calcaneal spur, left foot: Secondary | ICD-10-CM | POA: Diagnosis not present

## 2015-04-19 DIAGNOSIS — M79673 Pain in unspecified foot: Secondary | ICD-10-CM | POA: Diagnosis not present

## 2015-05-06 DIAGNOSIS — M7731 Calcaneal spur, right foot: Secondary | ICD-10-CM | POA: Diagnosis not present

## 2015-05-06 DIAGNOSIS — H6121 Impacted cerumen, right ear: Secondary | ICD-10-CM | POA: Diagnosis not present

## 2015-05-06 DIAGNOSIS — M71571 Other bursitis, not elsewhere classified, right ankle and foot: Secondary | ICD-10-CM | POA: Diagnosis not present

## 2015-05-06 DIAGNOSIS — M722 Plantar fascial fibromatosis: Secondary | ICD-10-CM | POA: Diagnosis not present

## 2015-05-18 DIAGNOSIS — M722 Plantar fascial fibromatosis: Secondary | ICD-10-CM | POA: Diagnosis not present

## 2015-05-18 DIAGNOSIS — M71571 Other bursitis, not elsewhere classified, right ankle and foot: Secondary | ICD-10-CM | POA: Diagnosis not present

## 2015-05-19 DIAGNOSIS — Z0000 Encounter for general adult medical examination without abnormal findings: Secondary | ICD-10-CM | POA: Diagnosis not present

## 2015-05-19 DIAGNOSIS — E780 Pure hypercholesterolemia: Secondary | ICD-10-CM | POA: Diagnosis not present

## 2015-05-19 DIAGNOSIS — E119 Type 2 diabetes mellitus without complications: Secondary | ICD-10-CM | POA: Diagnosis not present

## 2015-05-19 DIAGNOSIS — I10 Essential (primary) hypertension: Secondary | ICD-10-CM | POA: Diagnosis not present

## 2015-05-26 DIAGNOSIS — M71571 Other bursitis, not elsewhere classified, right ankle and foot: Secondary | ICD-10-CM | POA: Diagnosis not present

## 2015-05-26 DIAGNOSIS — M722 Plantar fascial fibromatosis: Secondary | ICD-10-CM | POA: Diagnosis not present

## 2015-07-08 DIAGNOSIS — G603 Idiopathic progressive neuropathy: Secondary | ICD-10-CM | POA: Diagnosis not present

## 2015-07-08 DIAGNOSIS — E559 Vitamin D deficiency, unspecified: Secondary | ICD-10-CM | POA: Diagnosis not present

## 2015-07-08 DIAGNOSIS — G5601 Carpal tunnel syndrome, right upper limb: Secondary | ICD-10-CM | POA: Diagnosis not present

## 2015-07-08 DIAGNOSIS — M5417 Radiculopathy, lumbosacral region: Secondary | ICD-10-CM | POA: Diagnosis not present

## 2015-07-08 DIAGNOSIS — G609 Hereditary and idiopathic neuropathy, unspecified: Secondary | ICD-10-CM | POA: Diagnosis not present

## 2015-07-08 DIAGNOSIS — R202 Paresthesia of skin: Secondary | ICD-10-CM | POA: Diagnosis not present

## 2015-07-08 DIAGNOSIS — R634 Abnormal weight loss: Secondary | ICD-10-CM | POA: Diagnosis not present

## 2015-07-16 ENCOUNTER — Ambulatory Visit (INDEPENDENT_AMBULATORY_CARE_PROVIDER_SITE_OTHER): Payer: Self-pay | Admitting: Physician Assistant

## 2015-07-16 VITALS — BP 122/83 | HR 102 | Temp 98.7°F | Resp 16 | Ht 67.0 in | Wt 220.0 lb

## 2015-07-16 DIAGNOSIS — Z021 Encounter for pre-employment examination: Secondary | ICD-10-CM

## 2015-07-16 DIAGNOSIS — Z0289 Encounter for other administrative examinations: Secondary | ICD-10-CM

## 2015-07-16 NOTE — Progress Notes (Signed)
Commercial Driver Medical Examination   Bobby Maxwell is a 69 y.o. male who presents today for a commercial driver fitness determination physical exam. The patient reports no problems. The following portions of the patient's history were reviewed and updated as appropriate: allergies, current medications, past family history, past medical history, past social history, past surgical history and problem list.  DM type 2 - on glipizide and metformin. No hypoglycemic episodes. Checks FGB 3x a week and ranges 110-140. No paresthesias. PCP is Dr. Justin Mend - sees her 1-2x a year. Last A1C 6.5.  HTN stable on meds.  HLD stable on meds.  No history OSA, daytime somnolence, MI, CVA.  Review of Systems Pertinent items are noted in HPI.   Objective:    Vision/hearing:  Visual Acuity Screening   Right eye Left eye Both eyes  Without correction: 20/30 20/25 20/25   With correction:     Comments: Color: 6/6 Peripheral: 5 bilateral  Hearing Screening Comments: [place]; 9 ft  Applicant can recognize and distinguish among traffic control signals and devices showing standard red, green, and amber colors.  Corrective lenses required: No  Monocular Vision?: No  Hearing aid requirement: No  Physical Exam  Constitutional: He is oriented to person, place, and time. He appears well-developed and well-nourished.  HENT:  Head: Normocephalic and atraumatic.  Right Ear: Hearing, tympanic membrane, external ear and ear canal normal.  Left Ear: Hearing, tympanic membrane, external ear and ear canal normal.  Nose: Nose normal.  Mouth/Throat: Uvula is midline, oropharynx is clear and moist and mucous membranes are normal.  Eyes: Conjunctivae, EOM and lids are normal. Pupils are equal, round, and reactive to light. Right eye exhibits no discharge. Left eye exhibits no discharge. No scleral icterus.  Neck: Trachea normal and normal range of motion. Neck supple. Carotid bruit is not present.  Cardiovascular:  Normal rate, regular rhythm, normal heart sounds, intact distal pulses and normal pulses.   No murmur heard. Pulmonary/Chest: Effort normal and breath sounds normal. No respiratory distress. He has no wheezes. He has no rhonchi. He has no rales.  Abdominal: Soft. Normal appearance. There is no tenderness. No hernia.  Obese   Musculoskeletal: Normal range of motion. He exhibits no edema or tenderness.  Surgical scars over bilateral knees. FROM.  Lymphadenopathy:       Head (right side): No submental, no submandibular, no tonsillar, no preauricular, no posterior auricular and no occipital adenopathy present.       Head (left side): No submental, no submandibular, no tonsillar, no preauricular, no posterior auricular and no occipital adenopathy present.    He has no cervical adenopathy.  Neurological: He is alert and oriented to person, place, and time. He has normal strength and normal reflexes. No cranial nerve deficit or sensory deficit. Coordination and gait normal.  Sensation intact to bilateral feet with monofilament testing.  Skin: Skin is warm, dry and intact. No lesion and no rash noted.  Psychiatric: He has a normal mood and affect. His speech is normal and behavior is normal. Judgment and thought content normal.   BP 122/83 mmHg  Pulse 102  Temp(Src) 98.7 F (37.1 C)  Resp 16  Ht 5\' 7"  (1.702 m)  Wt 220 lb (99.791 kg)  BMI 34.45 kg/m2  Labs:   SP GR 1.030 BLOOD neg PRO neg  GLU neg  Assessment:    Healthy male exam.  Meets standards, but periodic monitoring required due to HTN, DM type 2.  Financial planner only for 1  year.    Plan:    Medical examiners certificate completed and printed. Return as needed.  All conditions stable with meds. Follow up with PCP, Dr. Justin Mend.

## 2015-08-07 IMAGING — CT CT HEAD W/O CM
2 of 3 series · 16 of 30 positions shown, 19 images · non-contrast
Comparison: None.

CLINICAL DATA: Head injury 3 days ago.

EXAM:
CT HEAD WITHOUT CONTRAST
CT MAXILLOFACIAL WITHOUT CONTRAST
TECHNIQUE: Multidetector CT imaging of the head and maxillofacial structures
were performed using the standard protocol without intravenous
contrast. Multiplanar CT image reconstructions of the maxillofacial
structures were also generated.

[Series 3: facial st · axial · 0.31mm/px · z∈[-53,+77]mm · 12 of 77 slices shown, 15 images]
[im 6/77  brain]
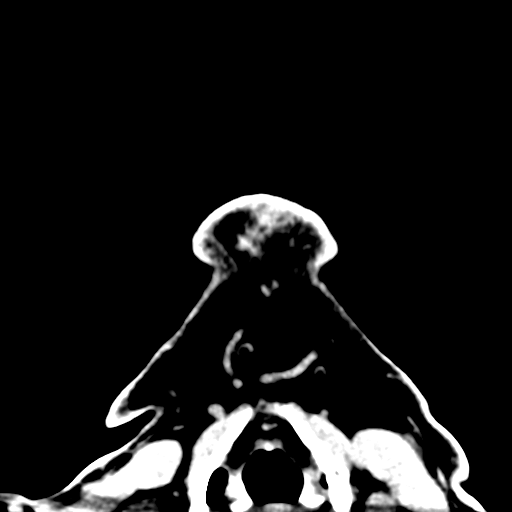
[im 6/77  bone]
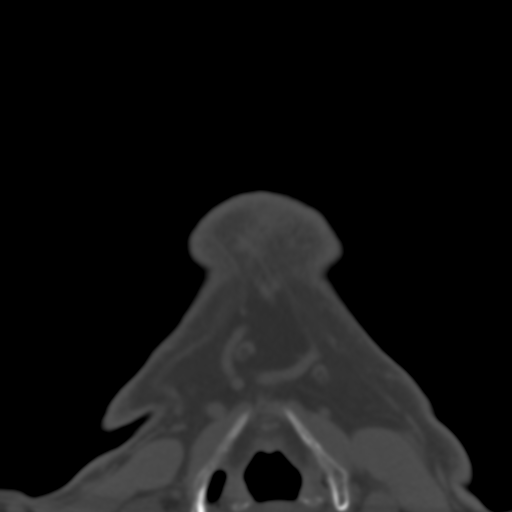
[im 12/77  brain]
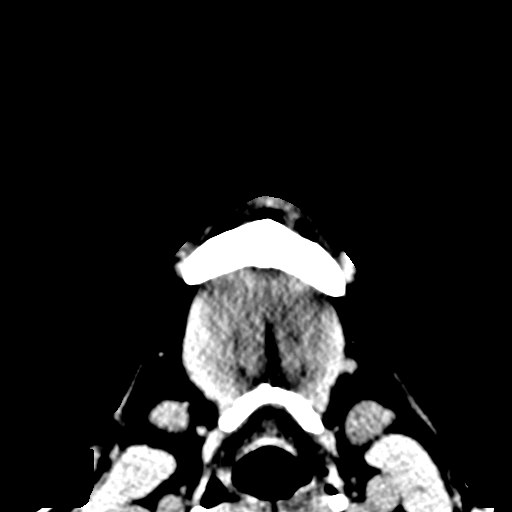
[im 18/77  brain]
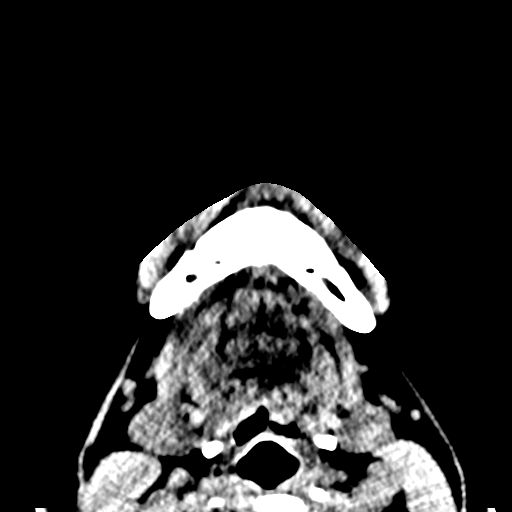
[im 24/77  brain]
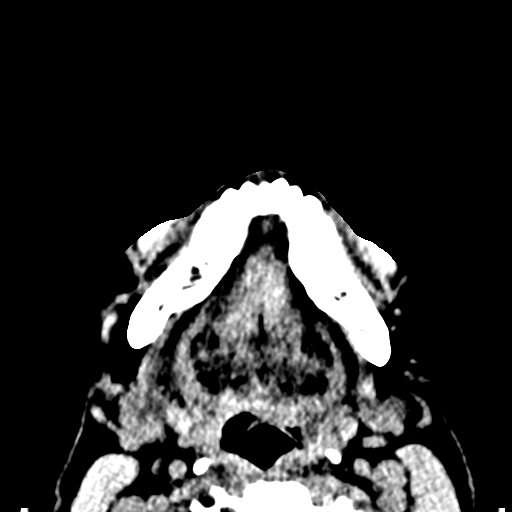
[im 30/77  brain]
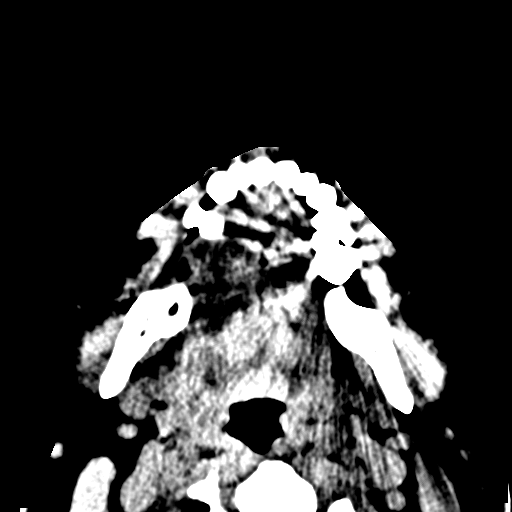
[im 30/77  bone]
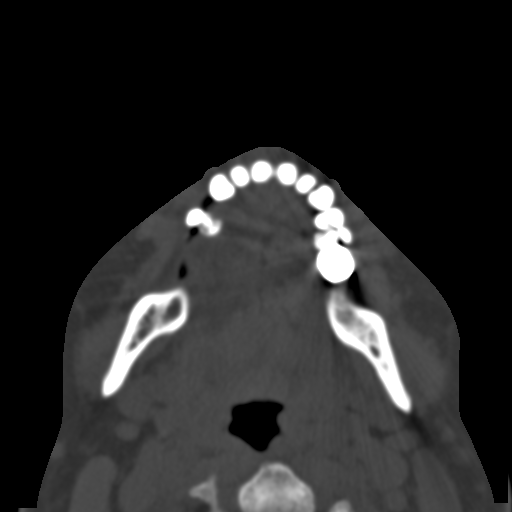
[im 36/77  brain]
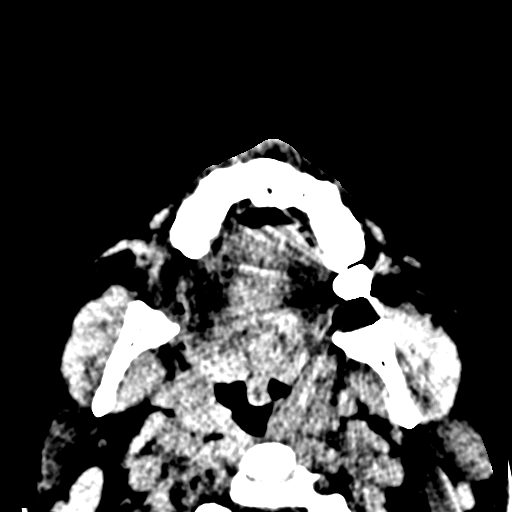
[im 41/77  brain]
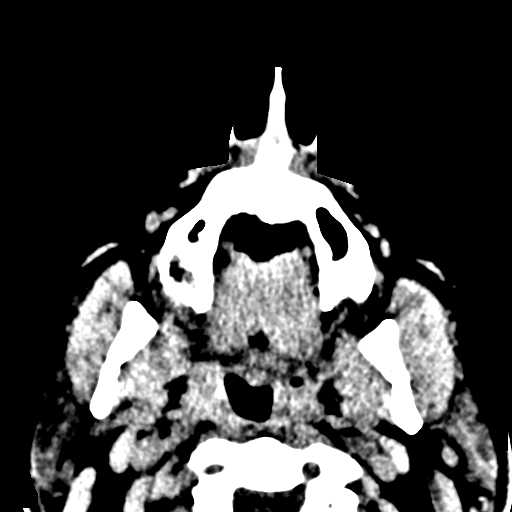
[im 47/77  brain]
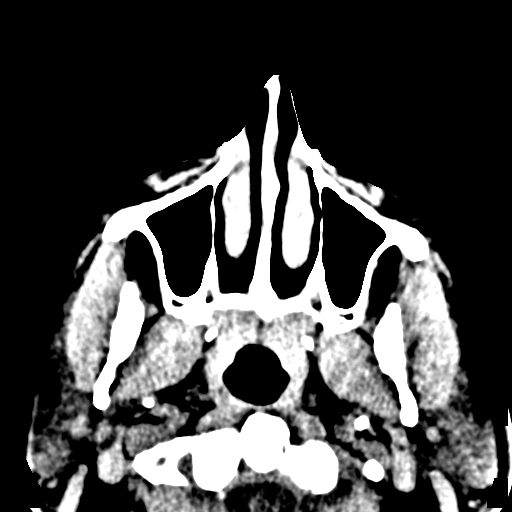
[im 53/77  brain]
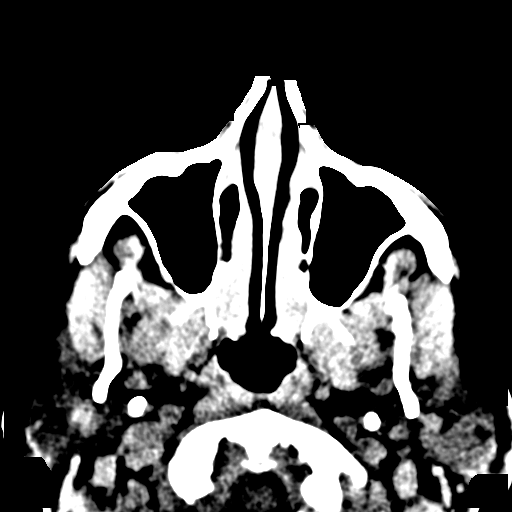
[im 53/77  bone]
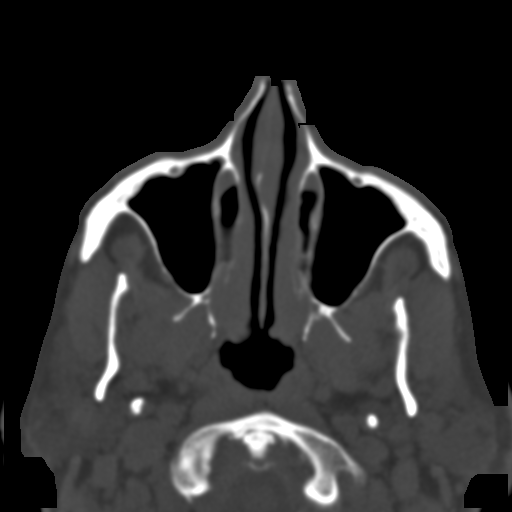
[im 59/77  brain]
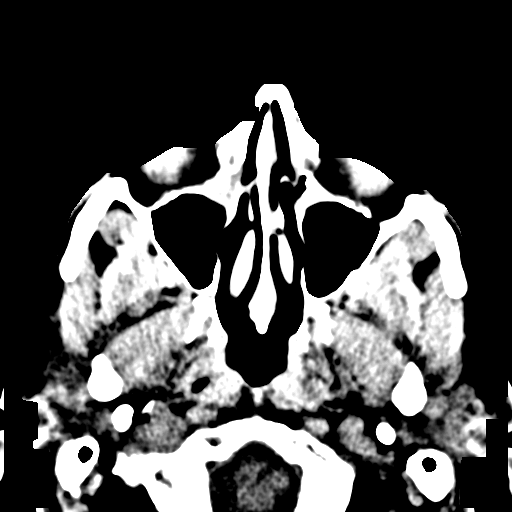
[im 65/77  brain]
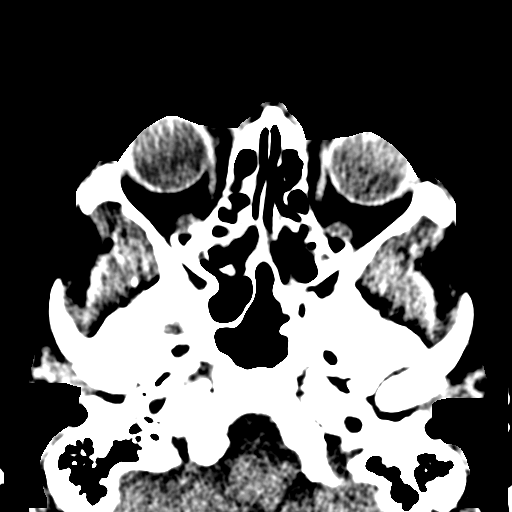
[im 71/77  brain]
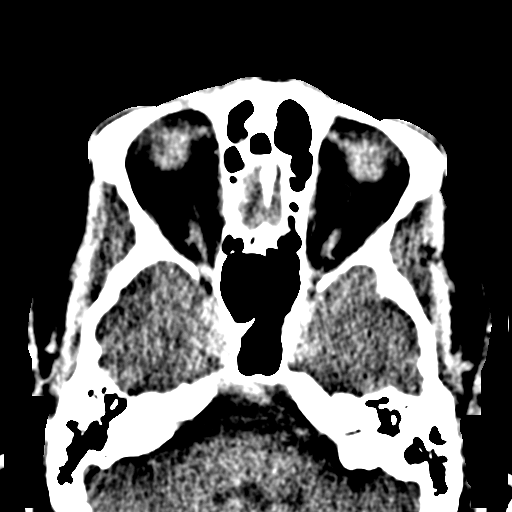

[Series 11: head w/o · axial · non-contrast · 0.43mm/px · z∈[+79,+169]mm · 4 of 30 slices shown]
[im 6/30  brain]
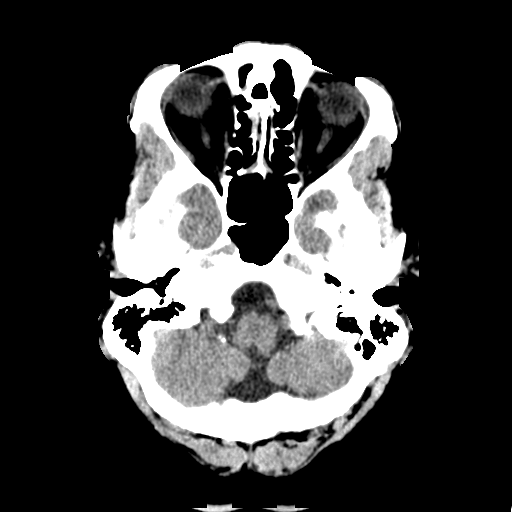
[im 12/30  brain]
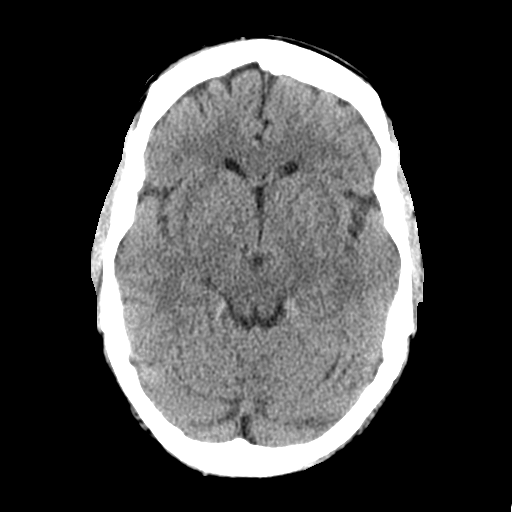
[im 18/30  brain]
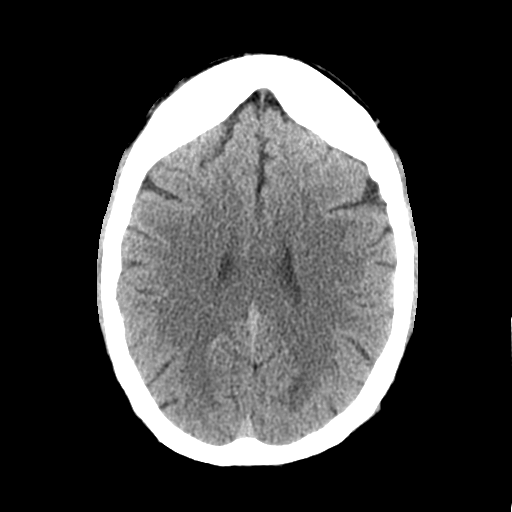
[im 24/30  brain]
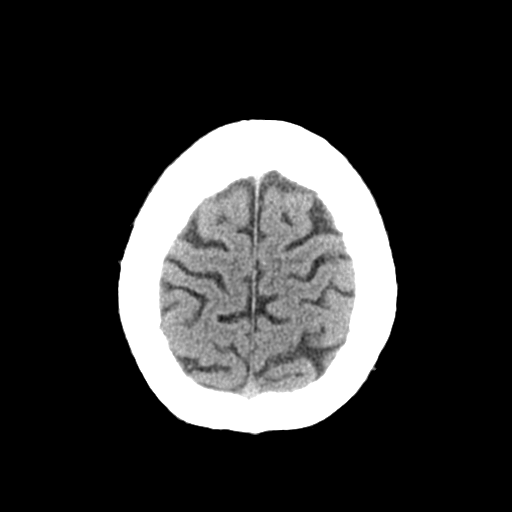

[16 of 30 positions shown; findings below may reference images not displayed]

FINDINGS: CT HEAD FINDINGS

No acute cortical infarct, hemorrhage, or mass lesion ispresent.
Ventricles are of normal size. No significant extra-axial fluid
collection is present. The paranasal sinuses andmastoid air cells
are clear. The osseous skull is intact.

CT MAXILLOFACIAL FINDINGS

Mild pan sinus mucosal thickening noted. No fluid levels identified.
The mastoid air cells are also clear. The mandible appears located.
The orbits are intact. No blow-out fracture.
IMPRESSION: 1. No acute intracranial abnormalities.
2. No evidence for orbital blowout fracture.

## 2015-08-13 DIAGNOSIS — E119 Type 2 diabetes mellitus without complications: Secondary | ICD-10-CM | POA: Diagnosis not present

## 2016-02-01 DIAGNOSIS — H04322 Acute dacryocystitis of left lacrimal passage: Secondary | ICD-10-CM | POA: Diagnosis not present

## 2016-02-18 DIAGNOSIS — H04321 Acute dacryocystitis of right lacrimal passage: Secondary | ICD-10-CM | POA: Diagnosis not present

## 2016-02-18 DIAGNOSIS — H04222 Epiphora due to insufficient drainage, left lacrimal gland: Secondary | ICD-10-CM | POA: Diagnosis not present

## 2016-02-18 DIAGNOSIS — H04221 Epiphora due to insufficient drainage, right lacrimal gland: Secondary | ICD-10-CM | POA: Diagnosis not present

## 2016-02-18 DIAGNOSIS — H04223 Epiphora due to insufficient drainage, bilateral lacrimal glands: Secondary | ICD-10-CM | POA: Diagnosis not present

## 2016-02-18 DIAGNOSIS — H04322 Acute dacryocystitis of left lacrimal passage: Secondary | ICD-10-CM | POA: Diagnosis not present

## 2016-05-23 DIAGNOSIS — G8929 Other chronic pain: Secondary | ICD-10-CM | POA: Diagnosis not present

## 2016-05-23 DIAGNOSIS — M25511 Pain in right shoulder: Secondary | ICD-10-CM | POA: Diagnosis not present

## 2016-06-09 DIAGNOSIS — D225 Melanocytic nevi of trunk: Secondary | ICD-10-CM | POA: Diagnosis not present

## 2016-06-09 DIAGNOSIS — L821 Other seborrheic keratosis: Secondary | ICD-10-CM | POA: Diagnosis not present

## 2016-06-09 DIAGNOSIS — L814 Other melanin hyperpigmentation: Secondary | ICD-10-CM | POA: Diagnosis not present

## 2016-06-09 DIAGNOSIS — D1801 Hemangioma of skin and subcutaneous tissue: Secondary | ICD-10-CM | POA: Diagnosis not present

## 2016-06-28 DIAGNOSIS — M25512 Pain in left shoulder: Secondary | ICD-10-CM | POA: Diagnosis not present

## 2016-06-28 DIAGNOSIS — M25511 Pain in right shoulder: Secondary | ICD-10-CM | POA: Diagnosis not present

## 2016-06-30 DIAGNOSIS — Z1211 Encounter for screening for malignant neoplasm of colon: Secondary | ICD-10-CM | POA: Diagnosis not present

## 2016-06-30 DIAGNOSIS — Z0000 Encounter for general adult medical examination without abnormal findings: Secondary | ICD-10-CM | POA: Diagnosis not present

## 2016-06-30 DIAGNOSIS — E119 Type 2 diabetes mellitus without complications: Secondary | ICD-10-CM | POA: Diagnosis not present

## 2016-06-30 DIAGNOSIS — E7800 Pure hypercholesterolemia, unspecified: Secondary | ICD-10-CM | POA: Diagnosis not present

## 2016-06-30 DIAGNOSIS — Z7984 Long term (current) use of oral hypoglycemic drugs: Secondary | ICD-10-CM | POA: Diagnosis not present

## 2016-08-25 DIAGNOSIS — E119 Type 2 diabetes mellitus without complications: Secondary | ICD-10-CM | POA: Diagnosis not present

## 2016-08-25 DIAGNOSIS — H2513 Age-related nuclear cataract, bilateral: Secondary | ICD-10-CM | POA: Diagnosis not present

## 2016-10-06 DIAGNOSIS — Z23 Encounter for immunization: Secondary | ICD-10-CM | POA: Diagnosis not present

## 2016-10-06 DIAGNOSIS — E119 Type 2 diabetes mellitus without complications: Secondary | ICD-10-CM | POA: Diagnosis not present

## 2016-10-11 ENCOUNTER — Other Ambulatory Visit (INDEPENDENT_AMBULATORY_CARE_PROVIDER_SITE_OTHER): Payer: Self-pay | Admitting: Radiology

## 2016-10-11 DIAGNOSIS — M25511 Pain in right shoulder: Principal | ICD-10-CM

## 2016-10-11 DIAGNOSIS — G8929 Other chronic pain: Secondary | ICD-10-CM

## 2016-10-11 MED ORDER — TRAMADOL HCL 50 MG PO TABS: 50.0000 mg | ORAL_TABLET | Freq: Every day | ORAL | 0 refills | Status: AC

## 2016-10-11 NOTE — Telephone Encounter (Signed)
Dr. Marlou Sa authorized this rx refill by signing the paper fax that was received.

## 2017-01-23 ENCOUNTER — Encounter (INDEPENDENT_AMBULATORY_CARE_PROVIDER_SITE_OTHER): Payer: Self-pay | Admitting: Orthopedic Surgery

## 2017-01-23 ENCOUNTER — Ambulatory Visit (INDEPENDENT_AMBULATORY_CARE_PROVIDER_SITE_OTHER): Payer: Worker's Compensation | Admitting: Orthopedic Surgery

## 2017-01-23 DIAGNOSIS — M533 Sacrococcygeal disorders, not elsewhere classified: Secondary | ICD-10-CM

## 2017-01-23 DIAGNOSIS — M25531 Pain in right wrist: Secondary | ICD-10-CM | POA: Diagnosis not present

## 2017-01-23 MED ORDER — TRAMADOL HCL 50 MG PO TABS: 50.0000 mg | ORAL_TABLET | Freq: Four times a day (QID) | ORAL | 0 refills | Status: DC | PRN

## 2017-01-23 MED ORDER — METHOCARBAMOL 500 MG PO TABS
500.0000 mg | ORAL_TABLET | Freq: Three times a day (TID) | ORAL | 0 refills | Status: DC | PRN
Start: 2017-01-23 — End: 2018-10-15

## 2017-01-23 NOTE — Progress Notes (Signed)
Office Visit Note   Patient: Bobby Maxwell           Date of Birth: 1946/06/05           MRN: [id number] Visit Date: 01/23/2017 Requested by: Bobby Austin, MD [place], [place] [address] PCP: Marjorie Smolder, MD  Subjective: Chief Complaint  Patient presents with  . Lower Back - Pain, Injury  . Right Wrist - Pain, Injury    HPI Bobby Maxwell is a 71 year old patient who injured his wrist and low back 01/05/2017 while he was at work.  He started his truck to warm up and then resume walking and fell on his buttocks and right wrist.  He is right-hand-dominant.  He can't sit for long.  He does have a history of back surgery "a long time ago".  Right wrist is been splinted.  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  Wrist had no definite fracture but some radiolucent lines at the base of the thumb which may be an ossicle and was also a radiolucent line near the radial styloid.  No significant displaced fracture was noted.  Patient's been out of work.              Review of Systems All systems reviewed are negative as they relate to the chief complaint within the history of present illness.  Patient denies  fevers or chills.    Assessment & Plan: Visit Diagnoses:  1. Coccyodynia   2. Pain in right wrist     Plan: Impression is right wrist pain after fall on outstretched hand.  He's having pain there the base of the thumb and there is a radiographic abnormality in that area.  I can't say for sure if it's of fracture.  Does not have snuffbox tenderness.  Does not look like a scaphoid fracture.  I like to get a CT scan just to rule out occult fracture there on the radial side of the wrist and the base of the thumb.  He can be out of work until we get that scan done.  In regards to the  back of think is aggravated some existing arthritis and that should improve.  No real nerve retention signs today.  His hand actually has been hurting him worse over the past 3 days and also like to rule out occult fracture  in order to clear him for work  Follow-Up Instructions: No Follow-up on file.   Orders:  No orders of the defined types were placed in this encounter.  No orders of the defined types were placed in this encounter.     Procedures: No procedures performed   Clinical Data: No additional findings.  Objective: Vital Signs: There were no vitals taken for this visit.  Physical Exam   Constitutional: Patient appears well-developed HEENT:  Head: Normocephalic Eyes:EOM are normal Neck: Normal range of motion Cardiovascular: Normal rate Pulmonary/chest: Effort normal Neurologic: Patient is alert Skin: Skin is warm Psychiatric: Patient has normal mood and affect    Ortho Exam orthopedic exam demonstrates a little bit of pain with wrist flexion but not with wrist extension.  Does have pain at the base of the thumb and a little bit of course grinding at the [facility] joint which is not present on the left-hand side.  EPL FPL interosseous strength is intact.  Radial pulses intact but is tender in this area.  FCR tendon is palpable and functional.  Elbow range of motion is full.  Examination of his back demonstrates well-healed surgical incision with some pain around at L5-S1 area.  Does have pain with forward lateral bending.  No bruising noted in the back or wrist area.  His coccyx is nontender to palpation.  Most of his pain is much more proximal.  No nerve root tension signs and no real motor sensory dysfunction in the legs.  Specialty Comments:  No specialty comments available.  Imaging: No results found.   PMFS History: Patient Active Problem List   Diagnosis Date Noted  . 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 Pain in right wrist 01/23/2017  . Benign neoplasm of colon  07/09/2014   Past Medical History:  Diagnosis Date  . Diabetes mellitus without complication ([place])   . Hypertension     Family History  Problem Relation Age of Onset  . Diabetes Mother   . Cancer Brother     Past Surgical History:  Procedure Laterality Date  . BACK SURGERY  1980's   lower  . COLONOSCOPY N/A 07/09/2014   Procedure: COLONOSCOPY;  Surgeon: Lear Ng, MD;  Location: [facility];  Service: Endoscopy;  Laterality: N/A;  . HERNIA REPAIR  40 yrs ago  . HOT HEMOSTASIS N/A 07/09/2014   Procedure: HOT HEMOSTASIS (ARGON PLASMA COAGULATION/BICAP);  Surgeon: Lear Ng, MD;  Location: [facility];  Service: Endoscopy;  Laterality: N/A;  . REPLACEMENT TOTAL KNEE BILATERAL    . TOTAL KNEE ARTHROPLASTY Bilateral    Social History   Occupational History  . Not on file.   Social History Main Topics  . Smoking status: Never Smoker  . Smokeless tobacco: Current User    Types: Snuff  . Alcohol use Yes     Comment: rare use  . Drug use: No  . Sexual activity: Yes

## 2017-01-24 ENCOUNTER — Telehealth (INDEPENDENT_AMBULATORY_CARE_PROVIDER_SITE_OTHER): Payer: Self-pay | Admitting: Orthopedic Surgery

## 2017-01-24 NOTE — Telephone Encounter (Signed)
Can you send pls?

## 2017-01-24 NOTE — Telephone Encounter (Signed)
Barachel from [facility] called and requested that the [facility] order be faxed to fax#[phone].  Her Cb#[phone].  Thank you

## 2017-01-24 NOTE — Telephone Encounter (Signed)
faxed

## 2017-02-02 ENCOUNTER — Ambulatory Visit (INDEPENDENT_AMBULATORY_CARE_PROVIDER_SITE_OTHER): Payer: Self-pay | Admitting: Orthopedic Surgery

## 2017-02-07 DIAGNOSIS — J209 Acute bronchitis, unspecified: Secondary | ICD-10-CM | POA: Diagnosis not present

## 2017-02-09 ENCOUNTER — Ambulatory Visit (INDEPENDENT_AMBULATORY_CARE_PROVIDER_SITE_OTHER): Payer: Worker's Compensation | Admitting: Orthopedic Surgery

## 2017-02-09 ENCOUNTER — Telehealth (INDEPENDENT_AMBULATORY_CARE_PROVIDER_SITE_OTHER): Payer: Self-pay | Admitting: Radiology

## 2017-02-09 ENCOUNTER — Encounter (INDEPENDENT_AMBULATORY_CARE_PROVIDER_SITE_OTHER): Payer: Self-pay | Admitting: Orthopedic Surgery

## 2017-02-09 DIAGNOSIS — M533 Sacrococcygeal disorders, not elsewhere classified: Secondary | ICD-10-CM | POA: Diagnosis not present

## 2017-02-09 DIAGNOSIS — M25531 Pain in right wrist: Secondary | ICD-10-CM | POA: Diagnosis not present

## 2017-02-09 NOTE — Telephone Encounter (Signed)
Faxed todays office note and work note to adj [employer] [phone]

## 2017-02-09 NOTE — Progress Notes (Signed)
Office Visit Note   Patient: Bobby Maxwell           Date of Birth: [birth year]           MRN: [id number] Visit Date: 02/09/2017 Requested by: Darcus Austin, MD [address] [place], [place] [address] PCP: Marjorie Smolder, MD  Subjective: Chief Complaint  Patient presents with  . Lower Back - Pain  . Right Wrist - Pain    HPI [name] is a 71 year old patient who injured himself about a month ago.  He's been having some sacral type pain as well as right wrist pain.  Since of sandals had a CT scan of the right wrist.  That scan is reviewed and it shows significant CMC arthritis but no fracture.  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  In general he's doing well but better with the wrist and the back.  Takes tramadol with slight relief.  Has not been back to work it.  At does have to pull himself up into the truck as well as the very vigorous opening of the doors.              Review of Systems All systems reviewed are negative as they relate to the chief complaint within the history of present illness.  Patient denies  fevers or chills.    Assessment & Plan: Visit Diagnoses:  1. Coccyodynia   2. Pain in right wrist     Plan: Impression is slow improvement in right wrist and sacral pain.  Plan I think he needs about 2 more weeks before he goes back to work doing very physical type of work that he is doing.  His wrist is improving.  Scaphoid is intact.  There is no fracture of the wrist.  He's been actually start working next week in terms of getting up to and into his calves.  We will let him return to work 2 weeks from this Monday.  Should be able to resume regular duty at that time.  If not I instructed him to come back so we can reevaluate him.  Continue his tramadol during the day.  Follow-Up Instructions: Return if symptoms worsen or fail to improve.   Orders:  No orders of the defined types were  placed in this encounter.  No orders of the defined types were placed in this encounter.     Procedures: No procedures performed   Clinical Data: No additional findings.  Objective: Vital Signs: There were no vitals taken for this visit.  Physical Exam   Constitutional: Patient appears well-developed HEENT:  Head: Normocephalic Eyes:EOM are normal Neck: Normal range of motion Cardiovascular: Normal rate Pulmonary/chest: Effort normal Neurologic: Patient is alert Skin: Skin is warm Psychiatric: Patient has normal mood and affect    Ortho Exam orthopedic exam demonstrates no nerve retention signs pretty normal gait alignment does have some pain with forward bending.  Right wrist demonstrates improving grip strength positive grind test good range of motion no scaphoid snuffbox tenderness palpable radial pulses  Specialty Comments:  No specialty comments available.  Imaging: No results found.   PMFS History: Patient Active Problem List   Diagnosis Date Noted  . 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 Pain in right wrist 01/23/2017  . 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 Diabetes mellitus without complication ([place])   . Hypertension     Family History  Problem Relation Age of Onset  . Diabetes Mother   . Cancer Brother     Past Surgical History:  Procedure Laterality Date  . BACK SURGERY  1980's   lower  . COLONOSCOPY N/A 07/09/2014   Procedure: COLONOSCOPY;  Surgeon: Lear Ng, MD;  Location: [facility];  Service: Endoscopy;  Laterality: N/A;  . HERNIA REPAIR  40 yrs ago  . HOT HEMOSTASIS N/A 07/09/2014   Procedure: HOT HEMOSTASIS (ARGON PLASMA COAGULATION/BICAP);  Surgeon: Lear Ng, MD;  Location: [facility];  Service: Endoscopy;  Laterality: N/A;  . REPLACEMENT TOTAL KNEE BILATERAL    . TOTAL KNEE ARTHROPLASTY Bilateral    Social History   Occupational History  . Not on file.   Social History  Main Topics  . Smoking status: Never Smoker  . Smokeless tobacco: Current User    Types: Snuff  . Alcohol use Yes     Comment: rare use  . Drug use: No  . Sexual activity: Yes

## 2017-02-09 NOTE — Telephone Encounter (Signed)
Patient would like out of work note faxed to [employer]. Can you please take care of this? I could not find the info in the patient's chart. Thanks.

## 2017-02-20 ENCOUNTER — Encounter (INDEPENDENT_AMBULATORY_CARE_PROVIDER_SITE_OTHER): Payer: Self-pay | Admitting: Radiology

## 2017-02-20 ENCOUNTER — Telehealth (INDEPENDENT_AMBULATORY_CARE_PROVIDER_SITE_OTHER): Payer: Self-pay | Admitting: *Deleted

## 2017-02-20 NOTE — Telephone Encounter (Signed)
Pt needs letter stating he can return back to work on 3/12. [place]

## 2017-02-20 NOTE — Telephone Encounter (Signed)
IC patient and advised letter done, LMVM that he can pickup copy at front desk.

## 2017-03-07 ENCOUNTER — Ambulatory Visit (INDEPENDENT_AMBULATORY_CARE_PROVIDER_SITE_OTHER): Payer: Self-pay | Admitting: Orthopedic Surgery

## 2017-06-06 DIAGNOSIS — R110 Nausea: Secondary | ICD-10-CM | POA: Diagnosis not present

## 2017-06-06 DIAGNOSIS — G8929 Other chronic pain: Secondary | ICD-10-CM | POA: Diagnosis not present

## 2017-06-06 DIAGNOSIS — M25511 Pain in right shoulder: Secondary | ICD-10-CM | POA: Diagnosis not present

## 2017-06-06 DIAGNOSIS — M25512 Pain in left shoulder: Secondary | ICD-10-CM | POA: Diagnosis not present

## 2017-06-06 DIAGNOSIS — Z720 Tobacco use: Secondary | ICD-10-CM | POA: Diagnosis not present

## 2017-06-06 DIAGNOSIS — H6123 Impacted cerumen, bilateral: Secondary | ICD-10-CM | POA: Diagnosis not present

## 2017-06-21 DIAGNOSIS — D239 Other benign neoplasm of skin, unspecified: Secondary | ICD-10-CM | POA: Diagnosis not present

## 2017-06-21 DIAGNOSIS — D1801 Hemangioma of skin and subcutaneous tissue: Secondary | ICD-10-CM | POA: Diagnosis not present

## 2017-06-21 DIAGNOSIS — L821 Other seborrheic keratosis: Secondary | ICD-10-CM | POA: Diagnosis not present

## 2017-06-21 DIAGNOSIS — L814 Other melanin hyperpigmentation: Secondary | ICD-10-CM | POA: Diagnosis not present

## 2017-07-20 ENCOUNTER — Ambulatory Visit: Payer: Self-pay | Admitting: Family Medicine

## 2017-08-31 DIAGNOSIS — E119 Type 2 diabetes mellitus without complications: Secondary | ICD-10-CM | POA: Diagnosis not present

## 2017-09-14 DIAGNOSIS — R1032 Left lower quadrant pain: Secondary | ICD-10-CM | POA: Diagnosis not present

## 2017-09-14 DIAGNOSIS — R300 Dysuria: Secondary | ICD-10-CM | POA: Diagnosis not present

## 2017-09-16 DIAGNOSIS — R399 Unspecified symptoms and signs involving the genitourinary system: Secondary | ICD-10-CM | POA: Diagnosis not present

## 2017-09-16 DIAGNOSIS — N390 Urinary tract infection, site not specified: Secondary | ICD-10-CM | POA: Diagnosis not present

## 2017-09-16 DIAGNOSIS — R319 Hematuria, unspecified: Secondary | ICD-10-CM | POA: Diagnosis not present

## 2017-09-16 DIAGNOSIS — R369 Urethral discharge, unspecified: Secondary | ICD-10-CM | POA: Diagnosis not present

## 2017-09-24 DIAGNOSIS — Z125 Encounter for screening for malignant neoplasm of prostate: Secondary | ICD-10-CM | POA: Diagnosis not present

## 2017-09-24 DIAGNOSIS — Z0000 Encounter for general adult medical examination without abnormal findings: Secondary | ICD-10-CM | POA: Diagnosis not present

## 2017-09-24 DIAGNOSIS — N3001 Acute cystitis with hematuria: Secondary | ICD-10-CM | POA: Diagnosis not present

## 2017-09-24 DIAGNOSIS — R3914 Feeling of incomplete bladder emptying: Secondary | ICD-10-CM | POA: Diagnosis not present

## 2017-09-24 DIAGNOSIS — Z23 Encounter for immunization: Secondary | ICD-10-CM | POA: Diagnosis not present

## 2017-09-24 DIAGNOSIS — I10 Essential (primary) hypertension: Secondary | ICD-10-CM | POA: Diagnosis not present

## 2017-09-24 DIAGNOSIS — E7800 Pure hypercholesterolemia, unspecified: Secondary | ICD-10-CM | POA: Diagnosis not present

## 2017-09-24 DIAGNOSIS — N401 Enlarged prostate with lower urinary tract symptoms: Secondary | ICD-10-CM | POA: Diagnosis not present

## 2017-09-24 DIAGNOSIS — E119 Type 2 diabetes mellitus without complications: Secondary | ICD-10-CM | POA: Diagnosis not present

## 2017-12-05 DIAGNOSIS — J329 Chronic sinusitis, unspecified: Secondary | ICD-10-CM | POA: Diagnosis not present

## 2017-12-28 DIAGNOSIS — J309 Allergic rhinitis, unspecified: Secondary | ICD-10-CM | POA: Diagnosis not present

## 2017-12-28 DIAGNOSIS — N400 Enlarged prostate without lower urinary tract symptoms: Secondary | ICD-10-CM | POA: Diagnosis not present

## 2017-12-28 DIAGNOSIS — H6121 Impacted cerumen, right ear: Secondary | ICD-10-CM | POA: Diagnosis not present

## 2018-01-18 ENCOUNTER — Encounter (INDEPENDENT_AMBULATORY_CARE_PROVIDER_SITE_OTHER): Payer: Self-pay | Admitting: Orthopedic Surgery

## 2018-01-18 ENCOUNTER — Ambulatory Visit (INDEPENDENT_AMBULATORY_CARE_PROVIDER_SITE_OTHER): Payer: Medicare HMO | Admitting: Orthopedic Surgery

## 2018-01-18 ENCOUNTER — Ambulatory Visit (INDEPENDENT_AMBULATORY_CARE_PROVIDER_SITE_OTHER): Payer: Medicare HMO

## 2018-01-18 DIAGNOSIS — M79642 Pain in left hand: Secondary | ICD-10-CM

## 2018-01-18 NOTE — Progress Notes (Signed)
 Office Visit Note   Patient: Bobby Maxwell           Date of Birth: 04/27/1946           MRN: [id number] Visit Date: 01/18/2018 Requested by: Gates, Donna, MD [address] [place], [place] [address] PCP: Gates, Donna, MD  Subjective: Chief Complaint  Patient presents with  . Left Hand - Pain    HPI: Bobby Maxwell is a patient with left hand and thumb pain.  He states his been hurting him for 2 weeks.  Denies any history of injury.  It developed a little bit of redness 2 weeks ago as well.  He denies any fevers or chills.  No family or personal history of gout or pseudogout.  He is right-hand dominant.  He is still functional with the left hand              ROS: All systems reviewed are negative as they relate to the chief complaint within the history of present illness.  Patient denies  fevers or chills.   Assessment & Plan: Visit Diagnoses:  1. Pain in left hand     Plan: Impression is severe CMC arthritis with new erythema and swelling around that CMC joint.  This does not really look like a closed space infection.  No history of trauma or injury.  Could be gout or pseudogout.  We need to do some lab work CBC differential sed rate C-reactive protein and uric acid as well as MRI with contrast to evaluate possible left thumb infection.  Again the erythema does disappear when we elevate his hand above his heart.  It has been there for 2 weeks which I would expect to have been more fulminant if this were an infection.  Follow-Up Instructions: Return for after MRI.   Orders:  Orders Placed This Encounter  Procedures  . XR Hand Complete Left  . MR HAND LEFT W CONTRAST  . CBC with Differential  . Sed Rate (ESR)  . CRP High sensitivity  . Uric acid   No orders of the defined types were placed in this encounter.     Procedures: No procedures performed   Clinical Data: No additional findings.  Objective: Vital Signs: There were no vitals taken for this  visit.  Physical Exam:   Constitutional: Patient appears well-developed HEENT:  Head: Normocephalic Eyes:EOM are normal Neck: Normal range of motion Cardiovascular: Normal rate Pulmonary/chest: Effort normal Neurologic: Patient is alert Skin: Skin is warm Psychiatric: Patient has normal mood and affect    Ortho Exam: Orthopedic exam demonstrates some erythema around that CMC joint extending about 3 cm.  No discrete areas of fluctuance around the thumb region.  EPL FPL intact.  Grind test shows mild pain but not severe.  No tenderness over the first dorsal compartment.  Abductor pollicis brevis strength is intact and there is no significant tenderness to palpation in the thenar eminence  Specialty Comments:  No specialty comments available.  Imaging: Xr Hand Complete Left  Result Date: 01/18/2018 AP lateral oblique left hand reviewed.  Severe CMC arthritis is present.  Degenerative changes also present around the carpal and MCP joints.  No fracture in the wrist.    PMFS History: Patient Active Problem List   Diagnosis Date Noted  . 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 Pain in right wrist 01/23/2017  . 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 Diabetes mellitus without complication ([facility])   .   Hypertension     Family History  Problem Relation Age of Onset  . Diabetes Mother   . Cancer Brother     Past Surgical History:  Procedure Laterality Date  . BACK SURGERY  1980's   lower  . COLONOSCOPY N/A 07/09/2014   Procedure: COLONOSCOPY;  Surgeon: Vincent C. Schooler, MD;  Location: [facility];  Service: Endoscopy;  Laterality: N/A;  . HERNIA REPAIR  40 yrs ago  . HOT HEMOSTASIS N/A 07/09/2014   Procedure: HOT HEMOSTASIS (ARGON PLASMA COAGULATION/BICAP);  Surgeon: Vincent C. Schooler, MD;  Location: [facility];  Service: Endoscopy;  Laterality: N/A;  . REPLACEMENT TOTAL KNEE BILATERAL    . TOTAL KNEE ARTHROPLASTY Bilateral    Social History    Occupational History  . Not on file  Tobacco Use  . Smoking status: Never Smoker  . Smokeless tobacco: Current User    Types: Snuff  Substance and Sexual Activity  . Alcohol use: Yes    Comment: rare use  . Drug use: No  . Sexual activity: Yes       

## 2018-01-20 ENCOUNTER — Ambulatory Visit
Admission: RE | Admit: 2018-01-20 | Discharge: 2018-01-20 | Disposition: A | Discharge status: Home / Self Care | Payer: Medicare HMO | Source: Ambulatory Visit | Attending: Orthopedic Surgery | Admitting: Orthopedic Surgery

## 2018-01-20 DIAGNOSIS — M79642 Pain in left hand: Secondary | ICD-10-CM

## 2018-01-20 DIAGNOSIS — M189 Osteoarthritis of first carpometacarpal joint, unspecified: Secondary | ICD-10-CM | POA: Diagnosis not present

## 2018-01-20 MED ORDER — GADOBENATE DIMEGLUMINE 529 MG/ML IV SOLN
19.0000 mL | Freq: Once | INTRAVENOUS | Status: AC | PRN
  Administered 2018-01-20: 19 mL via INTRAVENOUS

## 2018-01-21 LAB — CBC WITH DIFFERENTIAL/PLATELET
BASOS PCT: 0.9 %
Basophils Absolute: 59 cells/uL (ref 0–200)
Eosinophils Absolute: 271 cells/uL (ref 15–500)
Eosinophils Relative: 4.1 %
HCT: 41.7 % (ref 38.5–50.0)
Hemoglobin: 15 g/dL (ref 13.2–17.1)
LYMPHS ABS: 1346 {cells}/uL (ref 850–3900)
MCH: 31.7 pg (ref 27.0–33.0)
MCHC: 36 g/dL (ref 32.0–36.0)
MCV: 88.2 fL (ref 80.0–100.0)
MPV: 10.9 fL (ref 7.5–12.5)
Monocytes Relative: 9.1 %
Neutro Abs: 4323 cells/uL (ref 1500–7800)
Neutrophils Relative %: 65.5 %
PLATELETS: 271 10*3/uL (ref 140–400)
RBC: 4.73 10*6/uL (ref 4.20–5.80)
RDW: 13 % (ref 11.0–15.0)
Total Lymphocyte: 20.4 %
WBC: 6.6 10*3/uL (ref 3.8–10.8)
WBCMIX: 601 {cells}/uL (ref 200–950)

## 2018-01-21 LAB — URIC ACID: URIC ACID, SERUM: 5.5 mg/dL (ref 4.0–8.0)

## 2018-01-21 LAB — SEDIMENTATION RATE: SED RATE: 28 mm/h — AB (ref 0–20)

## 2018-01-21 LAB — HIGH SENSITIVITY CRP: hs-CRP: 12.5 mg/L — ABNORMAL HIGH

## 2018-01-22 ENCOUNTER — Telehealth (INDEPENDENT_AMBULATORY_CARE_PROVIDER_SITE_OTHER): Payer: Self-pay | Admitting: Orthopedic Surgery

## 2018-01-22 ENCOUNTER — Other Ambulatory Visit (INDEPENDENT_AMBULATORY_CARE_PROVIDER_SITE_OTHER): Payer: Self-pay

## 2018-01-22 ENCOUNTER — Telehealth (INDEPENDENT_AMBULATORY_CARE_PROVIDER_SITE_OTHER): Payer: Self-pay

## 2018-01-22 MED ORDER — CEPHALEXIN 500 MG PO CAPS: 500.0000 mg | ORAL_CAPSULE | Freq: Three times a day (TID) | ORAL | 0 refills | Status: DC

## 2018-01-22 NOTE — Telephone Encounter (Signed)
Per Dr Marlou Sa patient needs Keflex 500mg  x 10 days

## 2018-01-22 NOTE — Telephone Encounter (Signed)
Patient called stating pharmacy had not received his prescription for keflex. I resubmitted to pharmacy.

## 2018-01-22 NOTE — Telephone Encounter (Signed)
Dr Marlou Sa is in surgery. I have sent him a message to advise. Will call patient once I know something from Dr Marlou Sa.

## 2018-01-22 NOTE — Telephone Encounter (Signed)
Rx submitted to pharmacy for patient to take TID per Dr Marlou Sa. IC discussed results with patient per Dr Marlou Sa. He verbalized understanding. Also, scheduled him to come in and see Dr Marlou Sa on Thursday morning per Dr Forbes Cellar request.

## 2018-01-22 NOTE — Telephone Encounter (Signed)
Patient called trying to schedule MRI Review with Dr. Marlou Sa, told him we did not have anything available until next Wednesday. He was unhappy about this, so I told him I would see if we could work him in sooner and let him know. Please advise [phone]

## 2018-01-24 ENCOUNTER — Encounter (INDEPENDENT_AMBULATORY_CARE_PROVIDER_SITE_OTHER): Payer: Self-pay | Admitting: Orthopedic Surgery

## 2018-01-24 ENCOUNTER — Ambulatory Visit (INDEPENDENT_AMBULATORY_CARE_PROVIDER_SITE_OTHER): Payer: Medicare HMO | Admitting: Orthopedic Surgery

## 2018-01-24 DIAGNOSIS — M79642 Pain in left hand: Secondary | ICD-10-CM

## 2018-01-26 ENCOUNTER — Encounter (INDEPENDENT_AMBULATORY_CARE_PROVIDER_SITE_OTHER): Payer: Self-pay | Admitting: Orthopedic Surgery

## 2018-01-26 NOTE — Progress Notes (Signed)
Office Visit Note   Patient: Bobby Maxwell           Date of Birth: 1946-09-01           MRN: [id number] Visit Date: 01/24/2018 Requested by: Bobby Austin, MD [place],  [address] PCP: Bobby Austin, MD  Subjective: Chief Complaint  Patient presents with  . Left Hand - Follow-up    HPI: Bobby Maxwell presents for follow-up of his left thumb.  Since I have seen him he has had an MRI scan.  He started Keflex yesterday.  In general he states he is feeling better.  His movement is better.  MRI scan shows significant arthritis in the [facility] joint and also cellulitis.              ROS: All systems reviewed are negative as they relate to the chief complaint within the history of present illness.  Patient denies  fevers or chills.   Assessment & Plan: Visit Diagnoses:  1. Pain in left hand     Plan: Impression is nontraumatic cellulitis in that left thumb.  He is better on Keflex.  Finished that 10-day course and I will see him back in 2 weeks.  That will be 4 days off antibiotics.  He has made a very good recovery with just several days of antibiotics orally.  Follow-Up Instructions: Return in about 2 weeks (around 02/07/2018).   Orders:  No orders of the defined types were placed in this encounter.  No orders of the defined types were placed in this encounter.     Procedures: No procedures performed   Clinical Data: No additional findings.  Objective: Vital Signs: There were no vitals taken for this visit.  Physical Exam:   Constitutional: Patient appears well-developed HEENT:  Head: Normocephalic Eyes:EOM are normal Neck: Normal range of motion Cardiovascular: Normal rate Pulmonary/chest: Effort normal Neurologic: Patient is alert Skin: Skin is warm Psychiatric: Patient has normal mood and affect    Ortho Exam: Orthopedic exam demonstrates less redness and erythema around the base of the thumb.  His movement is better with flexion  and extension.  No canal pulse signs present.  Radial pulses intact.  Specialty Comments:  No specialty comments available.  Imaging: No results found.   PMFS History: Patient Active Problem List   Diagnosis Date Noted  . 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 Pain in right wrist 01/23/2017  . 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 Diabetes mellitus without complication ([place])   . Hypertension     Family History  Problem Relation Age of Onset  . Diabetes Mother   . Cancer Brother     Past Surgical History:  Procedure Laterality Date  . BACK SURGERY  1980's   lower  . COLONOSCOPY N/A 07/09/2014   Procedure: COLONOSCOPY;  Surgeon: Lear Ng, MD;  Location: [facility];  Service: Endoscopy;  Laterality: N/A;  . HERNIA REPAIR  40 yrs ago  . HOT HEMOSTASIS N/A 07/09/2014   Procedure: HOT HEMOSTASIS (ARGON PLASMA COAGULATION/BICAP);  Surgeon: Lear Ng, MD;  Location: [facility];  Service: Endoscopy;  Laterality: N/A;  . REPLACEMENT TOTAL KNEE BILATERAL    . TOTAL KNEE ARTHROPLASTY Bilateral    Social History   Occupational History  . Not on file  Tobacco Use  . Smoking status: Never Smoker  . Smokeless tobacco: Current User    Types: Snuff  Substance and Sexual Activity  .  Alcohol use: Yes    Comment: rare use  . Drug use: No  . Sexual activity: Yes

## 2018-02-08 ENCOUNTER — Encounter (INDEPENDENT_AMBULATORY_CARE_PROVIDER_SITE_OTHER): Payer: Self-pay | Admitting: Orthopedic Surgery

## 2018-02-08 ENCOUNTER — Ambulatory Visit (INDEPENDENT_AMBULATORY_CARE_PROVIDER_SITE_OTHER): Payer: Medicare HMO | Admitting: Orthopedic Surgery

## 2018-02-08 DIAGNOSIS — M79642 Pain in left hand: Secondary | ICD-10-CM

## 2018-02-08 NOTE — Progress Notes (Signed)
   Office Visit Note   Patient: Bobby Maxwell           Date of Birth: [birth year]           MRN: [id number] Visit Date: 02/08/2018 Requested by: Bobby Austin, MD [place], [place] [address] PCP: Bobby Austin, MD  Subjective: Chief Complaint  Patient presents with  . follow up left thumb    HPI: Bobby Maxwell is a patient with left thumb cellulitis.  He is doing better.  Finished antibiotics 7 days ago.  Describes decreased pain swelling and redness.              ROS: All systems reviewed are negative as they relate to the chief complaint within the history of present illness.  Patient denies  fevers or chills.   Assessment & Plan: Visit Diagnoses:  1. Pain in left hand     Plan: Impression is improvement in left thumb region cellulitis but with persistence of left thumb CMC arthritis.  I would not do an injection into that [facility] joint with his history of cellulitis.  Follow-up as needed  Follow-Up Instructions: Return if symptoms worsen or fail to improve.   Orders:  No orders of the defined types were placed in this encounter.  No orders of the defined types were placed in this encounter.     Procedures: No procedures performed   Clinical Data: No additional findings.  Objective: Vital Signs: There were no vitals taken for this visit.  Physical Exam:   Constitutional: Patient appears well-developed HEENT:  Head: Normocephalic Eyes:EOM are normal Neck: Normal range of motion Cardiovascular: Normal rate Pulmonary/chest: Effort normal Neurologic: Patient is alert Skin: Skin is warm Psychiatric: Patient has normal mood and affect    Ortho Exam: Orthopedic exam demonstrates CMC arthritis in the left thumb.  No redness warmth erythema noted in the thumb region.  EPL FPL function intact.  Grind test positive.  Specialty Comments:  No specialty comments available.  Imaging: No results found.   PMFS History: Patient Active Problem  List   Diagnosis Date Noted  . 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 Pain in right wrist 01/23/2017  . 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 Diabetes mellitus without complication ([place])   . Hypertension     Family History  Problem Relation Age of Onset  . Diabetes Mother   . Cancer Brother     Past Surgical History:  Procedure Laterality Date  . BACK SURGERY  1980's   lower  . COLONOSCOPY N/A 07/09/2014   Procedure: COLONOSCOPY;  Surgeon: Bobby Ng, MD;  Location: [facility];  Service: Endoscopy;  Laterality: N/A;  . HERNIA REPAIR  40 yrs ago  . HOT HEMOSTASIS N/A 07/09/2014   Procedure: HOT HEMOSTASIS (ARGON PLASMA COAGULATION/BICAP);  Surgeon: Bobby Ng, MD;  Location: [facility];  Service: Endoscopy;  Laterality: N/A;  . REPLACEMENT TOTAL KNEE BILATERAL    . TOTAL KNEE ARTHROPLASTY Bilateral    Social History   Occupational History  . Not on file  Tobacco Use  . Smoking status: Never Smoker  . Smokeless tobacco: Current User    Types: Snuff  Substance and Sexual Activity  . Alcohol use: Yes    Comment: rare use  . Drug use: No  . Sexual activity: Yes

## 2018-02-25 DIAGNOSIS — G8929 Other chronic pain: Secondary | ICD-10-CM | POA: Diagnosis not present

## 2018-02-25 DIAGNOSIS — J309 Allergic rhinitis, unspecified: Secondary | ICD-10-CM | POA: Diagnosis not present

## 2018-02-25 DIAGNOSIS — R32 Unspecified urinary incontinence: Secondary | ICD-10-CM | POA: Diagnosis not present

## 2018-02-25 DIAGNOSIS — I10 Essential (primary) hypertension: Secondary | ICD-10-CM | POA: Diagnosis not present

## 2018-02-25 DIAGNOSIS — E119 Type 2 diabetes mellitus without complications: Secondary | ICD-10-CM | POA: Diagnosis not present

## 2018-02-25 DIAGNOSIS — N400 Enlarged prostate without lower urinary tract symptoms: Secondary | ICD-10-CM | POA: Diagnosis not present

## 2018-02-25 DIAGNOSIS — R69 Illness, unspecified: Secondary | ICD-10-CM | POA: Diagnosis not present

## 2018-02-25 DIAGNOSIS — E559 Vitamin D deficiency, unspecified: Secondary | ICD-10-CM | POA: Diagnosis not present

## 2018-02-25 DIAGNOSIS — E669 Obesity, unspecified: Secondary | ICD-10-CM | POA: Diagnosis not present

## 2018-02-25 DIAGNOSIS — Z6834 Body mass index (BMI) 34.0-34.9, adult: Secondary | ICD-10-CM | POA: Diagnosis not present

## 2018-05-10 DIAGNOSIS — D225 Melanocytic nevi of trunk: Secondary | ICD-10-CM | POA: Diagnosis not present

## 2018-05-10 DIAGNOSIS — D1801 Hemangioma of skin and subcutaneous tissue: Secondary | ICD-10-CM | POA: Diagnosis not present

## 2018-05-10 DIAGNOSIS — L814 Other melanin hyperpigmentation: Secondary | ICD-10-CM | POA: Diagnosis not present

## 2018-05-10 DIAGNOSIS — B351 Tinea unguium: Secondary | ICD-10-CM | POA: Diagnosis not present

## 2018-05-10 DIAGNOSIS — L821 Other seborrheic keratosis: Secondary | ICD-10-CM | POA: Diagnosis not present

## 2018-05-10 DIAGNOSIS — L820 Inflamed seborrheic keratosis: Secondary | ICD-10-CM | POA: Diagnosis not present

## 2018-07-31 ENCOUNTER — Ambulatory Visit (INDEPENDENT_AMBULATORY_CARE_PROVIDER_SITE_OTHER): Payer: Medicare HMO | Admitting: Orthopedic Surgery

## 2018-07-31 ENCOUNTER — Encounter (INDEPENDENT_AMBULATORY_CARE_PROVIDER_SITE_OTHER): Payer: Self-pay | Admitting: Orthopedic Surgery

## 2018-07-31 ENCOUNTER — Ambulatory Visit (INDEPENDENT_AMBULATORY_CARE_PROVIDER_SITE_OTHER): Payer: Medicare HMO

## 2018-07-31 DIAGNOSIS — M79602 Pain in left arm: Secondary | ICD-10-CM

## 2018-07-31 DIAGNOSIS — M19012 Primary osteoarthritis, left shoulder: Secondary | ICD-10-CM | POA: Diagnosis not present

## 2018-07-31 MED ORDER — TRAMADOL HCL 50 MG PO TABS: ORAL_TABLET | ORAL | 0 refills | Status: DC

## 2018-08-04 ENCOUNTER — Encounter (INDEPENDENT_AMBULATORY_CARE_PROVIDER_SITE_OTHER): Payer: Self-pay | Admitting: Orthopedic Surgery

## 2018-08-04 NOTE — Progress Notes (Signed)
Office Visit Note   Patient: Bobby Maxwell           Date of Birth: May 21, 1946           MRN: [id number] Visit Date: 07/31/2018 Requested by: Darcus Austin, MD [address], [place] [id number] PCP: Darcus Austin, MD  Subjective: Chief Complaint  Patient presents with  . Left Shoulder - Pain    HPI: Changes a patient with left shoulder pain.  Injured his left shoulder 07/26/2018 when he was moving containers.  He believes he may have aggravated his left shoulder when he was trying to close 1 of these containers.  He is right-hand dominant.  He does describe pain radiating down the entire arm but no numbness and tingling.  He also reports some neck pain.  He is tried Tylenol which has not helped.  It is hard for him to sleep.              ROS: All systems reviewed are negative as they relate to the chief complaint within the history of present illness.  Patient denies  fevers or chills.   Assessment & Plan: Visit Diagnoses:  1. Left arm pain   2. Primary osteoarthritis of left shoulder     Plan: Impression is left shoulder glenohumeral arthritis likely aggravated with this injury on 07/26/2018.  Patient also has some degree of cervical spine degenerative disc disease ongoing.  I think that could be accounting for some of his whole arm symptoms.  Plan is one-time prescription for tramadol and injection into the glenohumeral joint performed today.  We will see if that helps.  Follow-up with me as needed.  Follow-Up Instructions: Return if symptoms worsen or fail to improve.   Orders:  Orders Placed This Encounter  Procedures  . XR Shoulder Left  . XR Cervical Spine 2 or 3 views   Meds ordered this encounter  Medications  . traMADol (ULTRAM) 50 MG tablet    Sig: 1 po q 12hrs prn pain    Dispense:  30 tablet    Refill:  0      Procedures: No procedures performed   Clinical Data: No additional findings.  Objective: Vital Signs: There were no  vitals taken for this visit.  Physical Exam:   Constitutional: Patient appears well-developed HEENT:  Head: Normocephalic Eyes:EOM are normal Neck: Normal range of motion Cardiovascular: Normal rate Pulmonary/chest: Effort normal Neurologic: Patient is alert Skin: Skin is warm Psychiatric: Patient has normal mood and affect    Ortho Exam: Ortho exam demonstrates pretty reasonable cervical spine range of motion with flexion but he does have some limited extension to about 30 degrees.  Rotation is 50 degrees bilaterally.  No masses lymphadenopathy or skin changes noted in the cervical spine region.  Shoulder exam demonstrates good rotator cuff strength but with some limitation of motion at the end of external rotation and forward flexion.  He does have forward flexion and abduction both above 90 degrees on that left-hand side.  No AC joint tenderness is present.  Cuff strength is good on the left to infraspinatus supraspinatus and subscap muscle testing.  Specialty Comments:  No specialty comments available.  Imaging: No results found.   PMFS History: Patient Active Problem List   Diagnosis Date Noted  . 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 Pain in right wrist 01/23/2017  . 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 Diabetes mellitus without complication ([facility])   .  Hypertension     Family History  Problem Relation Age of Onset  . Diabetes Mother   . Cancer Brother     Past Surgical History:  Procedure Laterality Date  . BACK SURGERY  1980's   lower  . COLONOSCOPY N/A 07/09/2014   Procedure: COLONOSCOPY;  Surgeon: Lear Ng, MD;  Location: [facility];  Service: Endoscopy;  Laterality: N/A;  . HERNIA REPAIR  40 yrs ago  . HOT HEMOSTASIS N/A 07/09/2014   Procedure: HOT HEMOSTASIS (ARGON PLASMA COAGULATION/BICAP);  Surgeon: Lear Ng, MD;  Location: [facility];  Service: Endoscopy;  Laterality: N/A;  . REPLACEMENT TOTAL KNEE  BILATERAL    . TOTAL KNEE ARTHROPLASTY Bilateral    Social History   Occupational History  . Not on file  Tobacco Use  . Smoking status: Never Smoker  . Smokeless tobacco: Current User    Types: Snuff  Substance and Sexual Activity  . Alcohol use: Yes    Comment: rare use  . Drug use: No  . Sexual activity: Yes

## 2018-08-12 DIAGNOSIS — M67912 Unspecified disorder of synovium and tendon, left shoulder: Secondary | ICD-10-CM | POA: Diagnosis not present

## 2018-08-12 DIAGNOSIS — R110 Nausea: Secondary | ICD-10-CM | POA: Diagnosis not present

## 2018-08-12 DIAGNOSIS — M5090 Cervical disc disorder, unspecified, unspecified cervical region: Secondary | ICD-10-CM | POA: Diagnosis not present

## 2018-08-12 DIAGNOSIS — M19012 Primary osteoarthritis, left shoulder: Secondary | ICD-10-CM | POA: Diagnosis not present

## 2018-08-21 DIAGNOSIS — B029 Zoster without complications: Secondary | ICD-10-CM | POA: Diagnosis not present

## 2018-08-27 ENCOUNTER — Other Ambulatory Visit: Payer: Self-pay | Admitting: Gastroenterology

## 2018-09-10 DIAGNOSIS — E113293 Type 2 diabetes mellitus with mild nonproliferative diabetic retinopathy without macular edema, bilateral: Secondary | ICD-10-CM | POA: Diagnosis not present

## 2018-10-11 ENCOUNTER — Encounter (HOSPITAL_COMMUNITY): Payer: Self-pay | Admitting: *Deleted

## 2018-10-11 ENCOUNTER — Other Ambulatory Visit: Payer: Self-pay | Admitting: Gastroenterology

## 2018-10-11 ENCOUNTER — Other Ambulatory Visit: Payer: Self-pay

## 2018-10-14 DIAGNOSIS — I10 Essential (primary) hypertension: Secondary | ICD-10-CM | POA: Diagnosis not present

## 2018-10-14 DIAGNOSIS — R69 Illness, unspecified: Secondary | ICD-10-CM | POA: Diagnosis not present

## 2018-10-14 DIAGNOSIS — E119 Type 2 diabetes mellitus without complications: Secondary | ICD-10-CM | POA: Diagnosis not present

## 2018-10-14 DIAGNOSIS — E785 Hyperlipidemia, unspecified: Secondary | ICD-10-CM | POA: Diagnosis not present

## 2018-10-14 DIAGNOSIS — Z0000 Encounter for general adult medical examination without abnormal findings: Secondary | ICD-10-CM | POA: Diagnosis not present

## 2018-10-14 DIAGNOSIS — Z125 Encounter for screening for malignant neoplasm of prostate: Secondary | ICD-10-CM | POA: Diagnosis not present

## 2018-10-14 DIAGNOSIS — Z23 Encounter for immunization: Secondary | ICD-10-CM | POA: Diagnosis not present

## 2018-10-15 ENCOUNTER — Encounter (HOSPITAL_COMMUNITY)
Admission: RE | Disposition: A | Discharge status: Home / Self Care | Payer: Self-pay | Source: Ambulatory Visit | Attending: Gastroenterology

## 2018-10-15 ENCOUNTER — Ambulatory Visit (HOSPITAL_COMMUNITY): Payer: Medicare HMO | Admitting: Anesthesiology

## 2018-10-15 ENCOUNTER — Encounter (HOSPITAL_COMMUNITY): Payer: Self-pay | Admitting: *Deleted

## 2018-10-15 ENCOUNTER — Ambulatory Visit (HOSPITAL_COMMUNITY)
Admission: RE | Admit: 2018-10-15 | Discharge: 2018-10-15 | Disposition: A | Discharge status: Home / Self Care | Payer: Medicare HMO | Source: Ambulatory Visit | Attending: Gastroenterology | Admitting: Gastroenterology

## 2018-10-15 ENCOUNTER — Other Ambulatory Visit: Payer: Self-pay

## 2018-10-15 DIAGNOSIS — D122 Benign neoplasm of ascending colon: Secondary | ICD-10-CM | POA: Insufficient documentation

## 2018-10-15 DIAGNOSIS — Z860100 Personal history of colon polyps, unspecified: Secondary | ICD-10-CM

## 2018-10-15 DIAGNOSIS — K640 First degree hemorrhoids: Secondary | ICD-10-CM | POA: Insufficient documentation

## 2018-10-15 DIAGNOSIS — Z1211 Encounter for screening for malignant neoplasm of colon: Secondary | ICD-10-CM | POA: Insufficient documentation

## 2018-10-15 DIAGNOSIS — K621 Rectal polyp: Secondary | ICD-10-CM | POA: Diagnosis not present

## 2018-10-15 DIAGNOSIS — Z87891 Personal history of nicotine dependence: Secondary | ICD-10-CM | POA: Diagnosis not present

## 2018-10-15 DIAGNOSIS — D128 Benign neoplasm of rectum: Secondary | ICD-10-CM | POA: Diagnosis not present

## 2018-10-15 DIAGNOSIS — I10 Essential (primary) hypertension: Secondary | ICD-10-CM | POA: Diagnosis not present

## 2018-10-15 DIAGNOSIS — E119 Type 2 diabetes mellitus without complications: Secondary | ICD-10-CM | POA: Diagnosis not present

## 2018-10-15 DIAGNOSIS — Z7984 Long term (current) use of oral hypoglycemic drugs: Secondary | ICD-10-CM | POA: Insufficient documentation

## 2018-10-15 DIAGNOSIS — K5730 Diverticulosis of large intestine without perforation or abscess without bleeding: Secondary | ICD-10-CM | POA: Diagnosis not present

## 2018-10-15 DIAGNOSIS — Z79899 Other long term (current) drug therapy: Secondary | ICD-10-CM | POA: Diagnosis not present

## 2018-10-15 HISTORY — DX: Gastro-esophageal reflux disease without esophagitis: K21.9

## 2018-10-15 HISTORY — PX: COLONOSCOPY WITH PROPOFOL: SHX5780

## 2018-10-15 HISTORY — PX: POLYPECTOMY: SHX5525

## 2018-10-15 HISTORY — DX: Unspecified osteoarthritis, unspecified site: M19.90

## 2018-10-15 LAB — GLUCOSE, CAPILLARY: Glucose-Capillary: 161 mg/dL — ABNORMAL HIGH (ref 70–99)

## 2018-10-15 SURGERY — COLONOSCOPY WITH PROPOFOL
Anesthesia: Monitor Anesthesia Care

## 2018-10-15 MED ORDER — PROPOFOL 500 MG/50ML IV EMUL
INTRAVENOUS | Status: DC | PRN
  Administered 2018-10-15: 40 mg via INTRAVENOUS

## 2018-10-15 MED ORDER — ONDANSETRON HCL 4 MG/2ML IJ SOLN
INTRAMUSCULAR | Status: DC | PRN
  Administered 2018-10-15: 4 mg via INTRAVENOUS

## 2018-10-15 MED ORDER — PROPOFOL 10 MG/ML IV BOLUS
INTRAVENOUS | Status: AC
  Filled 2018-10-15: qty 20

## 2018-10-15 MED ORDER — PROPOFOL 500 MG/50ML IV EMUL
INTRAVENOUS | Status: DC | PRN
  Administered 2018-10-15: 150 ug/kg/min via INTRAVENOUS

## 2018-10-15 MED ORDER — SODIUM CHLORIDE 0.9 % IV SOLN: INTRAVENOUS | Status: DC

## 2018-10-15 MED ORDER — PROPOFOL 10 MG/ML IV BOLUS
INTRAVENOUS | Status: AC
  Filled 2018-10-15: qty 40

## 2018-10-15 MED ORDER — LACTATED RINGERS IV SOLN
INTRAVENOUS | Status: DC
  Administered 2018-10-15: 1000 mL via INTRAVENOUS

## 2018-10-15 SURGICAL SUPPLY — 21 items

## 2018-10-15 NOTE — Discharge Instructions (Signed)

## 2018-10-15 NOTE — Transfer of Care (Signed)
Immediate Anesthesia Transfer of Care Note  Patient: Bobby Maxwell.  Procedure(s) Performed: COLONOSCOPY WITH PROPOFOL (N/A ) HOT HEMOSTASIS (ARGON PLASMA COAGULATION/BICAP) (N/A ) POLYPECTOMY  Patient Location: PACU  Anesthesia Type:MAC  Level of Consciousness: awake, alert  and oriented  Airway & Oxygen Therapy: Patient Spontanous Breathing and Patient connected to face mask oxygen  Post-op Assessment: Report given to RN and Post -op Vital signs reviewed and stable  Post vital signs: Reviewed and stable  Last Vitals:  Vitals Value Taken Time  BP    Temp    Pulse 60 10/15/2018 10:19 AM  Resp 18 10/15/2018 10:19 AM  SpO2 99 % 10/15/2018 10:19 AM  Vitals shown include unvalidated device data.  Last Pain:  Vitals:   10/15/18 0837  TempSrc: Oral  PainSc: 0-No pain         Complications: No apparent anesthesia complications

## 2018-10-15 NOTE — Anesthesia Postprocedure Evaluation (Signed)
Anesthesia Post Note  Patient: Bobby Maxwell.  Procedure(s) Performed: COLONOSCOPY WITH PROPOFOL (N/A ) HOT HEMOSTASIS (ARGON PLASMA COAGULATION/BICAP) (N/A ) POLYPECTOMY     Patient location during evaluation: Endoscopy Anesthesia Type: MAC Level of consciousness: awake and alert Pain management: pain level controlled Vital Signs Assessment: post-procedure vital signs reviewed and stable Respiratory status: spontaneous breathing, nonlabored ventilation and respiratory function stable Cardiovascular status: stable and blood pressure returned to baseline Postop Assessment: no apparent nausea or vomiting Anesthetic complications: no    Last Vitals:  Vitals:   10/15/18 1025 10/15/18 1030  BP:  125/85  Pulse: 60 66  Resp: 19 20  Temp:    SpO2: 99% 100%    Last Pain:  Vitals:   10/15/18 1030  TempSrc:   PainSc: 0-No pain                 Lynda Rainwater

## 2018-10-15 NOTE — Op Note (Signed)
[facility] Patient Name: Bobby Maxwell Procedure Date: 10/15/2018 MRN: [id number] Attending MD: Lear Ng , MD Date of Birth: Sep 06, 1946 CSN: [id number] Age: 72 Admit Type: Outpatient Procedure:                Colonoscopy Indications:              High risk colon cancer surveillance: Personal                            history of colonic polyps, Last colonoscopy: July                            2015 Providers:                Lear Ng, MD, Vista Lawman, RN, Elspeth Cho Tech., Technician, Rosario Adie, CRNA Referring MD:             Maurice Small Medicines:                Propofol per Anesthesia, Monitored Anesthesia Care Complications:            No immediate complications. Estimated Blood Loss:     Estimated blood loss was minimal. Procedure:                Pre-Anesthesia Assessment:                           - Prior to the procedure, a History and Physical                            was performed, and patient medications and                            allergies were reviewed. The patient's tolerance of                            previous anesthesia was also reviewed. The risks                            and benefits of the procedure and the sedation                            options and risks were discussed with the patient.                            All questions were answered, and informed consent                            was obtained. Prior Anticoagulants: The patient has                            taken no previous anticoagulant or antiplatelet  agents. ASA Grade Assessment: III - A patient with                            severe systemic disease. After reviewing the risks                            and benefits, the patient was deemed in                            satisfactory condition to undergo the procedure.                           After obtaining informed consent, the  colonoscope                            was passed under direct vision. Throughout the                            procedure, the patient's blood pressure, pulse, and                            oxygen saturations were monitored continuously. The                            PCF-H190DL ([id number]) Olympus peds colonoscope was                            introduced through the anus and advanced to the the                            cecum, identified by appendiceal orifice and                            ileocecal valve. The colonoscopy was performed with                            difficulty due to inadequate bowel prep. Successful                            completion of the procedure was aided by                            straightening and shortening the scope to obtain                            bowel loop reduction and lavage. The patient                            tolerated the procedure well. The quality of the                            bowel preparation was inadequate. The terminal  ileum, ileocecal valve, appendiceal orifice, and                            rectum were photographed. Scope In: 9:39:04 AM Scope Out: 10:11:23 AM Scope Withdrawal Time: 0 hours 29 minutes 46 seconds  Total Procedure Duration: 0 hours 32 minutes 19 seconds  Findings:      The perianal and digital rectal examinations were normal.      A 4 mm polyp was found in the ascending colon. The polyp was       semi-sessile. The polyp was removed with a cold snare. Resection and       retrieval were complete. Estimated blood loss was minimal.      A 5 mm polyp was found in the rectum. The polyp was sessile. The polyp       was removed with a hot snare. Resection and retrieval were complete.       Estimated blood loss: none.      A tattoo was seen in the transverse colon. The tattoo site appeared       normal.      Internal hemorrhoids were found during retroflexion. The hemorrhoids       were  small and Grade I (internal hemorrhoids that do not prolapse).      The terminal ileum appeared normal.      Multiple small and large-mouthed diverticula were found in the sigmoid       colon. Impression:               - Preparation of the colon was inadequate.                           - One 4 mm polyp in the ascending colon, removed                            with a cold snare. Resected and retrieved.                           - One 5 mm polyp in the rectum, removed with a hot                            snare. Resected and retrieved.                           - A tattoo was seen in the transverse colon. The                            tattoo site appeared normal.                           - Internal hemorrhoids.                           - The examined portion of the ileum was normal.                           - Diverticulosis in the sigmoid colon. Moderate Sedation:      N/A- Per Anesthesia Care Recommendation:           -  Patient has a contact number available for                            emergencies. The signs and symptoms of potential                            delayed complications were discussed with the                            patient. Return to normal activities tomorrow.                            Written discharge instructions were provided to the                            patient.                           - High fiber diet.                           - Await pathology results.                           - Repeat colonoscopy for surveillance based on                            pathology results. Procedure Code(s):        --- Professional ---                           [phone], Colonoscopy, flexible; with removal of                            tumor(s), polyp(s), or other lesion(s) by snare                            technique Diagnosis Code(s):        --- Professional ---                           Z86.010, Personal history of colonic polyps                           D12.2,  Benign neoplasm of ascending colon                           K62.1, Rectal polyp                           K64.0, First degree hemorrhoids                           K57.30, Diverticulosis of large intestine without                            perforation or abscess without bleeding CPT copyright 2018  American Medical Association. All rights reserved. The codes documented in this report are preliminary and upon coder review may  be revised to meet current compliance requirements. Lear Ng, MD 10/15/2018 10:22:44 AM This report has been signed electronically. Number of Addenda: 0

## 2018-10-15 NOTE — Anesthesia Preprocedure Evaluation (Signed)
Anesthesia Evaluation  Patient identified by MRN, date of birth, ID band Patient awake    Reviewed: Allergy & Precautions, H&P , NPO status , Patient's Chart, lab work & pertinent test results  Airway Mallampati: II  TM Distance: >3 FB Neck ROM: Full    Dental no notable dental hx.    Pulmonary neg pulmonary ROS, former smoker,    Pulmonary exam normal breath sounds clear to auscultation       Cardiovascular hypertension, Pt. on medications Normal cardiovascular exam Rhythm:Regular Rate:Normal     Neuro/Psych negative neurological ROS  negative psych ROS   GI/Hepatic negative GI ROS, Neg liver ROS,   Endo/Other  diabetes, Type 2, Oral Hypoglycemic Agents  Renal/GU negative Renal ROS  negative genitourinary   Musculoskeletal negative musculoskeletal ROS (+)   Abdominal   Peds negative pediatric ROS (+)  Hematology negative hematology ROS (+)   Anesthesia Other Findings   Reproductive/Obstetrics negative OB ROS                             Anesthesia Physical  Anesthesia Plan  ASA: III  Anesthesia Plan: MAC   Post-op Pain Management:    Induction: Intravenous  PONV Risk Score and Plan:   Airway Management Planned:   Additional Equipment:   Intra-op Plan:   Post-operative Plan:   Informed Consent: I have reviewed the patients History and Physical, chart, labs and discussed the procedure including the risks, benefits and alternatives for the proposed anesthesia with the patient or authorized representative who has indicated his/her understanding and acceptance.   Dental advisory given  Plan Discussed with: CRNA  Anesthesia Plan Comments:         Anesthesia Quick Evaluation

## 2018-10-15 NOTE — H&P (Signed)
Date of Initial H&P: 10/11/18  History reviewed, patient examined, no change in status, stable for surgery.

## 2018-10-15 NOTE — Interval H&P Note (Signed)
History and Physical Interval Note:  10/15/2018 9:26 AM  Bobby Maxwell.  has presented today for surgery, with the diagnosis of History of colon polyps  The various methods of treatment have been discussed with the patient and family. After consideration of risks, benefits and other options for treatment, the patient has consented to  Procedure(s): COLONOSCOPY WITH PROPOFOL (N/A) HOT HEMOSTASIS (ARGON PLASMA COAGULATION/BICAP) (N/A) as a surgical intervention .  The patient's history has been reviewed, patient examined, no change in status, stable for surgery.  I have reviewed the patient's chart and labs.  Questions were answered to the patient's satisfaction.     Lear Ng

## 2018-10-15 NOTE — Anesthesia Procedure Notes (Signed)
Date/Time: 10/15/2018 9:27 AM Performed by: Glory Buff, CRNA Oxygen Delivery Method: Simple face mask

## 2018-10-17 ENCOUNTER — Encounter (HOSPITAL_COMMUNITY): Payer: Self-pay | Admitting: Gastroenterology

## 2018-11-18 DIAGNOSIS — R69 Illness, unspecified: Secondary | ICD-10-CM | POA: Diagnosis not present

## 2018-12-14 DIAGNOSIS — R69 Illness, unspecified: Secondary | ICD-10-CM | POA: Diagnosis not present

## 2019-01-16 DIAGNOSIS — R69 Illness, unspecified: Secondary | ICD-10-CM | POA: Diagnosis not present

## 2019-01-29 DIAGNOSIS — R69 Illness, unspecified: Secondary | ICD-10-CM | POA: Diagnosis not present

## 2019-02-21 DIAGNOSIS — H938X3 Other specified disorders of ear, bilateral: Secondary | ICD-10-CM | POA: Diagnosis not present

## 2019-02-21 DIAGNOSIS — H6123 Impacted cerumen, bilateral: Secondary | ICD-10-CM | POA: Diagnosis not present

## 2019-02-26 DIAGNOSIS — R69 Illness, unspecified: Secondary | ICD-10-CM | POA: Diagnosis not present

## 2019-04-13 DIAGNOSIS — R69 Illness, unspecified: Secondary | ICD-10-CM | POA: Diagnosis not present

## 2019-10-03 DIAGNOSIS — E119 Type 2 diabetes mellitus without complications: Secondary | ICD-10-CM | POA: Insufficient documentation

## 2019-10-03 DIAGNOSIS — E1159 Type 2 diabetes mellitus with other circulatory complications: Secondary | ICD-10-CM | POA: Insufficient documentation

## 2019-10-03 DIAGNOSIS — N400 Enlarged prostate without lower urinary tract symptoms: Secondary | ICD-10-CM | POA: Insufficient documentation

## 2019-10-03 DIAGNOSIS — I152 Hypertension secondary to endocrine disorders: Secondary | ICD-10-CM | POA: Insufficient documentation

## 2019-10-09 DIAGNOSIS — E113293 Type 2 diabetes mellitus with mild nonproliferative diabetic retinopathy without macular edema, bilateral: Secondary | ICD-10-CM | POA: Insufficient documentation

## 2019-10-17 DIAGNOSIS — K5790 Diverticulosis of intestine, part unspecified, without perforation or abscess without bleeding: Secondary | ICD-10-CM | POA: Insufficient documentation

## 2019-12-19 DIAGNOSIS — Z87442 Personal history of urinary calculi: Secondary | ICD-10-CM

## 2019-12-19 HISTORY — DX: Personal history of urinary calculi: Z87.442

## 2020-06-29 ENCOUNTER — Encounter (HOSPITAL_COMMUNITY): Payer: Self-pay | Admitting: Neurological Surgery

## 2020-06-29 ENCOUNTER — Emergency Department (HOSPITAL_COMMUNITY): Payer: Medicare HMO

## 2020-06-29 ENCOUNTER — Inpatient Hospital Stay (HOSPITAL_COMMUNITY)
Admission: EM | Admit: 2020-06-29 | Discharge: 2020-07-28 | DRG: 004 | Disposition: A | Discharge status: Hospice | Payer: Medicare HMO | Attending: Internal Medicine | Admitting: Internal Medicine

## 2020-06-29 ENCOUNTER — Other Ambulatory Visit: Payer: Self-pay

## 2020-06-29 DIAGNOSIS — A4159 Other Gram-negative sepsis: Secondary | ICD-10-CM | POA: Diagnosis not present

## 2020-06-29 DIAGNOSIS — E870 Hyperosmolality and hypernatremia: Secondary | ICD-10-CM | POA: Diagnosis not present

## 2020-06-29 DIAGNOSIS — Z978 Presence of other specified devices: Secondary | ICD-10-CM

## 2020-06-29 DIAGNOSIS — S066XAA Traumatic subarachnoid hemorrhage with loss of consciousness status unknown, initial encounter: Secondary | ICD-10-CM | POA: Diagnosis present

## 2020-06-29 DIAGNOSIS — L89892 Pressure ulcer of other site, stage 2: Secondary | ICD-10-CM | POA: Diagnosis not present

## 2020-06-29 DIAGNOSIS — S066X1D Traumatic subarachnoid hemorrhage with loss of consciousness of 30 minutes or less, subsequent encounter: Secondary | ICD-10-CM | POA: Diagnosis not present

## 2020-06-29 DIAGNOSIS — J9601 Acute respiratory failure with hypoxia: Secondary | ICD-10-CM | POA: Diagnosis not present

## 2020-06-29 DIAGNOSIS — Z7189 Other specified counseling: Secondary | ICD-10-CM | POA: Diagnosis not present

## 2020-06-29 DIAGNOSIS — J150 Pneumonia due to Klebsiella pneumoniae: Secondary | ICD-10-CM | POA: Diagnosis not present

## 2020-06-29 DIAGNOSIS — Y92830 Public park as the place of occurrence of the external cause: Secondary | ICD-10-CM

## 2020-06-29 DIAGNOSIS — S066X9A Traumatic subarachnoid hemorrhage with loss of consciousness of unspecified duration, initial encounter: Secondary | ICD-10-CM | POA: Diagnosis present

## 2020-06-29 DIAGNOSIS — Z930 Tracheostomy status: Secondary | ICD-10-CM

## 2020-06-29 DIAGNOSIS — Z7982 Long term (current) use of aspirin: Secondary | ICD-10-CM

## 2020-06-29 DIAGNOSIS — E1165 Type 2 diabetes mellitus with hyperglycemia: Secondary | ICD-10-CM | POA: Diagnosis present

## 2020-06-29 DIAGNOSIS — J9690 Respiratory failure, unspecified, unspecified whether with hypoxia or hypercapnia: Secondary | ICD-10-CM

## 2020-06-29 DIAGNOSIS — S066X1A Traumatic subarachnoid hemorrhage with loss of consciousness of 30 minutes or less, initial encounter: Principal | ICD-10-CM | POA: Diagnosis present

## 2020-06-29 DIAGNOSIS — Z515 Encounter for palliative care: Secondary | ICD-10-CM | POA: Diagnosis not present

## 2020-06-29 DIAGNOSIS — J95851 Ventilator associated pneumonia: Secondary | ICD-10-CM | POA: Diagnosis not present

## 2020-06-29 DIAGNOSIS — Z01818 Encounter for other preprocedural examination: Secondary | ICD-10-CM

## 2020-06-29 DIAGNOSIS — E871 Hypo-osmolality and hyponatremia: Secondary | ICD-10-CM | POA: Diagnosis not present

## 2020-06-29 DIAGNOSIS — H9222 Otorrhagia, left ear: Secondary | ICD-10-CM | POA: Diagnosis present

## 2020-06-29 DIAGNOSIS — Z20822 Contact with and (suspected) exposure to covid-19: Secondary | ICD-10-CM | POA: Diagnosis present

## 2020-06-29 DIAGNOSIS — G936 Cerebral edema: Secondary | ICD-10-CM | POA: Diagnosis not present

## 2020-06-29 DIAGNOSIS — S06361A Traumatic hemorrhage of cerebrum, unspecified, with loss of consciousness of 30 minutes or less, initial encounter: Secondary | ICD-10-CM | POA: Diagnosis not present

## 2020-06-29 DIAGNOSIS — R402212 Coma scale, best verbal response, none, at arrival to emergency department: Secondary | ICD-10-CM | POA: Diagnosis present

## 2020-06-29 DIAGNOSIS — M7989 Other specified soft tissue disorders: Secondary | ICD-10-CM | POA: Diagnosis not present

## 2020-06-29 DIAGNOSIS — Y95 Nosocomial condition: Secondary | ICD-10-CM | POA: Diagnosis not present

## 2020-06-29 DIAGNOSIS — Y901 Blood alcohol level of 20-39 mg/100 ml: Secondary | ICD-10-CM | POA: Diagnosis present

## 2020-06-29 DIAGNOSIS — Z66 Do not resuscitate: Secondary | ICD-10-CM | POA: Diagnosis not present

## 2020-06-29 DIAGNOSIS — I10 Essential (primary) hypertension: Secondary | ICD-10-CM | POA: Diagnosis present

## 2020-06-29 DIAGNOSIS — Z4659 Encounter for fitting and adjustment of other gastrointestinal appliance and device: Secondary | ICD-10-CM

## 2020-06-29 DIAGNOSIS — G9340 Encephalopathy, unspecified: Secondary | ICD-10-CM | POA: Diagnosis not present

## 2020-06-29 DIAGNOSIS — Z7984 Long term (current) use of oral hypoglycemic drugs: Secondary | ICD-10-CM

## 2020-06-29 DIAGNOSIS — S065X1A Traumatic subdural hemorrhage with loss of consciousness of 30 minutes or less, initial encounter: Secondary | ICD-10-CM | POA: Diagnosis present

## 2020-06-29 DIAGNOSIS — R402352 Coma scale, best motor response, localizes pain, at arrival to emergency department: Secondary | ICD-10-CM | POA: Diagnosis present

## 2020-06-29 DIAGNOSIS — G9349 Other encephalopathy: Secondary | ICD-10-CM | POA: Diagnosis present

## 2020-06-29 DIAGNOSIS — W1830XA Fall on same level, unspecified, initial encounter: Secondary | ICD-10-CM | POA: Diagnosis present

## 2020-06-29 DIAGNOSIS — S0219XA Other fracture of base of skull, initial encounter for closed fracture: Secondary | ICD-10-CM | POA: Diagnosis present

## 2020-06-29 DIAGNOSIS — Y93K1 Activity, walking an animal: Secondary | ICD-10-CM

## 2020-06-29 DIAGNOSIS — Z1611 Resistance to penicillins: Secondary | ICD-10-CM | POA: Diagnosis not present

## 2020-06-29 DIAGNOSIS — L89812 Pressure ulcer of head, stage 2: Secondary | ICD-10-CM | POA: Diagnosis not present

## 2020-06-29 DIAGNOSIS — L8990 Pressure ulcer of unspecified site, unspecified stage: Secondary | ICD-10-CM | POA: Insufficient documentation

## 2020-06-29 DIAGNOSIS — R1310 Dysphagia, unspecified: Secondary | ICD-10-CM | POA: Diagnosis not present

## 2020-06-29 DIAGNOSIS — R569 Unspecified convulsions: Secondary | ICD-10-CM | POA: Diagnosis not present

## 2020-06-29 DIAGNOSIS — E876 Hypokalemia: Secondary | ICD-10-CM | POA: Diagnosis not present

## 2020-06-29 DIAGNOSIS — R000 Tachycardia, unspecified: Secondary | ICD-10-CM | POA: Diagnosis present

## 2020-06-29 DIAGNOSIS — S06361D Traumatic hemorrhage of cerebrum, unspecified, with loss of consciousness of 30 minutes or less, subsequent encounter: Secondary | ICD-10-CM | POA: Diagnosis not present

## 2020-06-29 DIAGNOSIS — R402132 Coma scale, eyes open, to sound, at arrival to emergency department: Secondary | ICD-10-CM | POA: Diagnosis present

## 2020-06-29 DIAGNOSIS — S066X0D Traumatic subarachnoid hemorrhage without loss of consciousness, subsequent encounter: Secondary | ICD-10-CM | POA: Diagnosis not present

## 2020-06-29 DIAGNOSIS — F10129 Alcohol abuse with intoxication, unspecified: Secondary | ICD-10-CM | POA: Diagnosis present

## 2020-06-29 DIAGNOSIS — J9600 Acute respiratory failure, unspecified whether with hypoxia or hypercapnia: Secondary | ICD-10-CM | POA: Diagnosis not present

## 2020-06-29 DIAGNOSIS — Z79899 Other long term (current) drug therapy: Secondary | ICD-10-CM

## 2020-06-29 DIAGNOSIS — R0682 Tachypnea, not elsewhere classified: Secondary | ICD-10-CM

## 2020-06-29 HISTORY — DX: Essential (primary) hypertension: I10

## 2020-06-29 HISTORY — DX: Type 2 diabetes mellitus without complications: E11.9

## 2020-06-29 LAB — ETHANOL: Alcohol, Ethyl (B): 26 mg/dL — ABNORMAL HIGH (ref <10)

## 2020-06-29 LAB — CBC WITH DIFFERENTIAL/PLATELET
Abs Immature Granulocytes: 0.07 10*3/uL (ref 0.00–0.07)
Basophils Absolute: 0 10*3/uL (ref 0.0–0.1)
Basophils Relative: 0 %
Eosinophils Absolute: 0.1 10*3/uL (ref 0.0–0.5)
Eosinophils Relative: 1 %
HCT: 44.4 % (ref 39.0–52.0)
Hemoglobin: 14.3 g/dL (ref 13.0–17.0)
Immature Granulocytes: 1 %
Lymphocytes Relative: 38 %
Lymphs Abs: 5.7 10*3/uL — ABNORMAL HIGH (ref 0.7–4.0)
MCH: 28.8 pg (ref 26.0–34.0)
MCHC: 32.2 g/dL (ref 30.0–36.0)
MCV: 89.3 fL (ref 80.0–100.0)
Monocytes Absolute: 1.2 10*3/uL — ABNORMAL HIGH (ref 0.1–1.0)
Monocytes Relative: 8 %
Neutro Abs: 8.1 10*3/uL — ABNORMAL HIGH (ref 1.7–7.7)
Neutrophils Relative %: 52 %
Platelets: 219 10*3/uL (ref 150–400)
RBC: 4.97 MIL/uL (ref 4.22–5.81)
RDW: 12.9 % (ref 11.5–15.5)
WBC: 15.1 10*3/uL — ABNORMAL HIGH (ref 4.0–10.5)
nRBC: 0 % (ref 0.0–0.2)

## 2020-06-29 LAB — BASIC METABOLIC PANEL
Anion gap: 17 — ABNORMAL HIGH (ref 5–15)
BUN: 11 mg/dL (ref 8–23)
CO2: 22 mmol/L (ref 22–32)
Calcium: 9.5 mg/dL (ref 8.9–10.3)
Chloride: 96 mmol/L — ABNORMAL LOW (ref 98–111)
Creatinine, Ser: 1.17 mg/dL (ref 0.61–1.24)
GFR calc Af Amer: >60 mL/min (ref >60)
GFR calc non Af Amer: >60 mL/min (ref >60)
Glucose, Bld: 206 mg/dL — ABNORMAL HIGH (ref 70–99)
Potassium: 3.9 mmol/L (ref 3.5–5.1)
Sodium: 135 mmol/L (ref 135–145)

## 2020-06-29 LAB — TYPE AND SCREEN
ABO/RH(D): AB POS
Antibody Screen: NEGATIVE

## 2020-06-29 LAB — SARS CORONAVIRUS 2 BY RT PCR (HOSPITAL ORDER, PERFORMED IN ~~LOC~~ HOSPITAL LAB): SARS Coronavirus 2: NEGATIVE

## 2020-06-29 LAB — ABO/RH: ABO/RH(D): AB POS

## 2020-06-29 LAB — CBG MONITORING, ED: Glucose-Capillary: 196 mg/dL — ABNORMAL HIGH (ref 70–99)

## 2020-06-29 MED ORDER — ACETAMINOPHEN 325 MG PO TABS
650.0000 mg | ORAL_TABLET | ORAL | Status: DC | PRN
  Filled 2020-06-29: qty 2

## 2020-06-29 MED ORDER — HYDROCODONE-ACETAMINOPHEN 5-325 MG PO TABS
1.0000 | ORAL_TABLET | ORAL | Status: DC | PRN
  Filled 2020-06-29 (×2): qty 1

## 2020-06-29 MED ORDER — LEVETIRACETAM 500 MG PO TABS: 500.0000 mg | ORAL_TABLET | Freq: Two times a day (BID) | ORAL | Status: DC

## 2020-06-29 MED ORDER — ONDANSETRON HCL 4 MG/2ML IJ SOLN: 4.0000 mg | Freq: Four times a day (QID) | INTRAMUSCULAR | Status: DC | PRN

## 2020-06-29 MED ORDER — ADULT MULTIVITAMIN W/MINERALS CH
1.0000 | ORAL_TABLET | Freq: Every day | ORAL | Status: DC
  Filled 2020-06-29: qty 1

## 2020-06-29 MED ORDER — INSULIN ASPART 100 UNIT/ML ~~LOC~~ SOLN
0.0000 [IU] | Freq: Three times a day (TID) | SUBCUTANEOUS | Status: DC
  Administered 2020-06-30: 7 [IU] via SUBCUTANEOUS
  Administered 2020-06-30: 3 [IU] via SUBCUTANEOUS
  Administered 2020-06-30: 9 [IU] via SUBCUTANEOUS
  Administered 2020-07-01: 5 [IU] via SUBCUTANEOUS

## 2020-06-29 MED ORDER — FOLIC ACID 1 MG PO TABS
1.0000 mg | ORAL_TABLET | Freq: Every day | ORAL | Status: DC
  Filled 2020-06-29: qty 1

## 2020-06-29 MED ORDER — SODIUM CHLORIDE 0.9 % IV SOLN: INTRAVENOUS | Status: DC

## 2020-06-29 MED ORDER — CLEVIDIPINE BUTYRATE 0.5 MG/ML IV EMUL
0.0000 mg/h | INTRAVENOUS | Status: DC
  Administered 2020-06-29: 2 mg/h via INTRAVENOUS
  Administered 2020-06-30: 15 mg/h via INTRAVENOUS
  Administered 2020-06-30: 21 mg/h via INTRAVENOUS
  Administered 2020-06-30 (×2): 20 mg/h via INTRAVENOUS
  Administered 2020-06-30 (×2): 18 mg/h via INTRAVENOUS
  Administered 2020-06-30 (×3): 21 mg/h via INTRAVENOUS
  Administered 2020-07-01: 10 mg/h via INTRAVENOUS
  Administered 2020-07-01: 21 mg/h via INTRAVENOUS
  Administered 2020-07-01: 20 mg/h via INTRAVENOUS
  Administered 2020-07-01: 21 mg/h via INTRAVENOUS
  Administered 2020-07-01: 20 mg/h via INTRAVENOUS
  Filled 2020-06-29 (×2): qty 50
  Filled 2020-06-29: qty 100
  Filled 2020-06-29 (×5): qty 50
  Filled 2020-06-29: qty 100
  Filled 2020-06-29 (×4): qty 50

## 2020-06-29 MED ORDER — SODIUM CHLORIDE 0.9% IV SOLUTION: Freq: Once | INTRAVENOUS | Status: AC

## 2020-06-29 MED ORDER — THIAMINE HCL 100 MG/ML IJ SOLN
100.0000 mg | Freq: Every day | INTRAMUSCULAR | Status: DC
  Administered 2020-06-30 – 2020-07-01 (×2): 100 mg via INTRAVENOUS
  Filled 2020-06-29 (×2): qty 2

## 2020-06-29 MED ORDER — DOCUSATE SODIUM 100 MG PO CAPS: 100.0000 mg | ORAL_CAPSULE | Freq: Two times a day (BID) | ORAL | Status: DC | PRN

## 2020-06-29 MED ORDER — LEVETIRACETAM IN NACL 1000 MG/100ML IV SOLN
1000.0000 mg | Freq: Once | INTRAVENOUS | Status: AC
  Administered 2020-06-29: 1000 mg via INTRAVENOUS
  Filled 2020-06-29: qty 100

## 2020-06-29 MED ORDER — POLYETHYLENE GLYCOL 3350 17 G PO PACK: 17.0000 g | PACK | Freq: Every day | ORAL | Status: DC | PRN

## 2020-06-29 MED ORDER — THIAMINE HCL 100 MG PO TABS: 100.0000 mg | ORAL_TABLET | Freq: Every day | ORAL | Status: DC

## 2020-06-29 MED ORDER — ONDANSETRON HCL 4 MG PO TABS: 4.0000 mg | ORAL_TABLET | Freq: Four times a day (QID) | ORAL | Status: DC | PRN

## 2020-06-29 MED ORDER — CLEVIDIPINE BUTYRATE 0.5 MG/ML IV EMUL: 0.0000 mg/h | INTRAVENOUS | Status: DC

## 2020-06-29 NOTE — Consult Note (Addendum)
NAME:  Harold Mckee, MRN:  [id number], DOB:  23-Jul-1946, LOS: 0 ADMISSION DATE:  06/29/2020, CONSULTATION DATE:  06/29/2020 REFERRING MD:  Dr. Jake Samples, CHIEF COMPLAINT:  Bilateral subarachnoid/subdural hematomas   Brief History   74 year old male seen after witnessed fall, patient was seen drinking alcohol prior to fall who was found to have left scull fracture and bilateral subarachnoid/subdural hematomas. PCCM consulted for assistance in admission.   History of present illness   Harold Mckee is a 74yo make with PMX significant for HTN and Diabetes who presented after ground level fall resulting in head injury while walking his dog in the park. Per EMS patient was seen drinking alcohol prior to fall.   On arrival patient was seen bleeding from his left ear. Vitals on arrival significant for mild tachypnea and hypertension. Labwork significant for hyperglycemia and mild leukocytosis. CT C-spin and head positive for  left scull fracture and bilateral subarachnoid/subdural hematomas. PCCM consulted for assistance in admission.   Past Medical History  HTN  Diabetes   Significant Hospital Events   Admitted 7/13  Consults:  Neurosurgery   Procedures:    Significant Diagnostic Tests:  CT C-spine and head 7/13 > 1. Nondisplaced fracture of the left parietal calvarium along the left lambdoid suture extending into the left occipital calvarium and left temporal bone with involvement of the left mastoid air cell and external auditory canal. Dedicated temporal bone CT is recommended for further evaluation. 2. Bilateral subdural and subarachnoid hemorrhages. No midline shift. 3. Focal hemorrhage with small pockets of pneumocephalus in the left posterior temporal lobe along the calvarial fracture. This likely represents combination of subarachnoid and intraparenchymal hemorrhage. 4. No acute/traumatic cervical spine pathology.  Micro Data:  COVID 7/13 > negative   Antimicrobials:       Interim history/subjective:  Seen lying in bed altered.   Objective   Blood pressure 134/76, pulse 94, temperature (!) 97.4 F (36.3 C), temperature source Axillary, resp. rate (!) 22, SpO2 98 %.       No intake or output data in the 24 hours ending 06/29/20 2341 There were no vitals filed for this visit.  Examination: General: Chronically ill appearing elderly thin male lying in bed, in NAD HEENT: [place]/AT, MM pink/moist, PERRL, dried blood seen in left ear canal  Neuro: Altered and unable to follow any commands  CV: s1s2 regular rate and rhythm, no murmur, rubs, or gallops,  PULM:  Clear to ascultation bilaterally  GI: soft, bowel sounds active in all 4 quadrants, non-tender, non-distended, tolerating TF Extremities: warm/dry, no edema  Skin: no rashes or lesions   Resolved Hospital Problem list     Assessment & Plan:  Mechanical fall resulting in Left scull fracture and bilateral subarachnoid/subdural hematomas.  P: Management per neurosurgery  Admit to neuro ICU  Frequent neuro checks  AEDs per neurosurgery  NPO  Hypertension  P: SBP goal less than 140 Cleviprex drip  Monitor in the ICU setting   Diabetes  -Does no appear to be on any maintance medications  P: SSI  CBG q4hrs  Obtain hemoglobin A1C  Mild leucocytosis  -WBC 15.1 on admission, likely reactive  P: No acute signs of an infection  Trend CBC and fever curve   Alcohol intoxication -ETOH level 26 on arrival, unsure if this is acute vs chronic P: CIWA q4 hrs Monitor in the ICU setting  May need initiation on benzodiazepines Supplement thiamin, folate, and vitamin B12   Best practice:  Diet: NPO Pain/Anxiety/Delirium protocol (  if indicated): PRN  VAP protocol (if indicated): N/A DVT prophylaxis: SCD GI prophylaxis: PPI Glucose control: SSI Mobility: Bedrest  Code Status: Full Family Communication: Per primary  Disposition: ICU  Labs   CBC: Recent Labs  Lab 06/29/20 1944  WBC  15.1*  NEUTROABS 8.1*  HGB 14.3  HCT 44.4  MCV 89.3  PLT 219    Basic Metabolic Panel: Recent Labs  Lab 06/29/20 1944  NA 135  K 3.9  CL 96*  CO2 22  GLUCOSE 206*  BUN 11  CREATININE 1.17  CALCIUM 9.5   GFR: CrCl cannot be calculated (Unknown ideal weight.). Recent Labs  Lab 06/29/20 1944  WBC 15.1*    Liver Function Tests: No results for input(s): AST, ALT, ALKPHOS, BILITOT, PROT, ALBUMIN in the last 168 hours. No results for input(s): LIPASE, AMYLASE in the last 168 hours. No results for input(s): AMMONIA in the last 168 hours.  ABG No results found for: PHART, PCO2ART, PO2ART, HCO3, TCO2, ACIDBASEDEF, O2SAT   Coagulation Profile: No results for input(s): INR, PROTIME in the last 168 hours.  Cardiac Enzymes: No results for input(s): CKTOTAL, CKMB, CKMBINDEX, TROPONINI in the last 168 hours.  HbA1C: No results found for: HGBA1C  CBG: Recent Labs  Lab 06/29/20 1943  GLUCAP 196*    Review of Systems:   Unable to gather due to encephalopathy   Past Medical History  He,  has a past medical history of Diabetes mellitus without complication ([facility]) and Hypertension.   Surgical History   History reviewed. No pertinent surgical history.   Social History      Family History   His family history is not on file.   Allergies Not on File   Home Medications  Prior to Admission medications   Not on File     Critical care time:   Performed by: [name]  Total critical care time: 45 minutes  Critical care time was exclusive of separately billable procedures and treating other patients.  Critical care was necessary to treat or prevent imminent or life-threatening deterioration.  Critical care was time spent personally by me on the following activities: development of treatment plan with patient and/or surrogate as well as nursing, discussions with consultants, evaluation of patient's response to treatment, examination of patient, obtaining  history from patient or surrogate, ordering and performing treatments and interventions, ordering and review of laboratory studies, ordering and review of radiographic studies, pulse oximetry and re-evaluation of patient's condition.   [name], NP-[facility] Contact / Pager information can be found on Amion  06/30/2020, 12:08 AM   Patient seen examined and chart reviewed.  Critical care planning discussed with NP Earlene Plater.  Agree with above assessment and plan.

## 2020-06-29 NOTE — ED Provider Notes (Signed)
[facility] EMERGENCY DEPARTMENT Provider Note   CSN: [id number] Arrival date & time: 06/29/20  1936     History Chief Complaint  Patient presents with  . Fall  . Altered Mental Status    Harold Mckee is a 74 y.o. male.  74 yo M with a chief complaints of a fall.  This was apparently witnessed.  Patient been drinking today and he was walking to the park and they think he passed out and collapsed to the ground.  They think he struck his head.  After which they felt that he was less responsive than normal.  Noted to have some bleeding from the left ear.  Brought to the ED for evaluation.  Level five caveat altered mental status.  The history is provided by the patient.  Fall Pertinent negatives include no chest pain, no abdominal pain, no headaches and no shortness of breath.  Altered Mental Status Presenting symptoms: no confusion   Associated symptoms: no abdominal pain, no fever, no headaches, no palpitations, no rash and no vomiting        Past Medical History:  Diagnosis Date  . Diabetes mellitus without complication (HCC)   . Hypertension     Patient Active Problem List   Diagnosis Date Noted  . ICH (intracerebral hemorrhage) (HCC) 06/29/2020  . Subarachnoid hemorrhage following injury (HCC) 06/29/2020    History reviewed. No pertinent surgical history.     No family history on file.  Social History   Tobacco Use  . Smoking status: Not on file  Substance Use Topics  . Alcohol use: Not on file  . Drug use: Not on file    Home Medications Prior to Admission medications   Not on File    Allergies    Patient has no allergy information on record.  Review of Systems   Review of Systems  Unable to perform ROS: Mental status change  Constitutional: Negative for chills and fever.  HENT: Negative for congestion and facial swelling.   Eyes: Negative for discharge and visual disturbance.  Respiratory: Negative for shortness of breath.    Cardiovascular: Negative for chest pain and palpitations.  Gastrointestinal: Negative for abdominal pain, diarrhea and vomiting.  Musculoskeletal: Negative for arthralgias and myalgias.  Skin: Negative for color change and rash.  Neurological: Negative for tremors, syncope and headaches.  Psychiatric/Behavioral: Negative for confusion and dysphoric mood.    Physical Exam Updated Vital Signs BP (!) 161/90   Pulse (!) 102   Temp 98.1 F (36.7 C) (Oral)   Resp (!) 23   SpO2 98%   Physical Exam Vitals and nursing note reviewed.  Constitutional:      Appearance: He is well-developed.     Comments: Awakes to voice  HENT:     Head: Normocephalic.      Ears:     Comments: Blood from the left ear canal.  Not seen behind tm.  Eyes:     Pupils: Pupils are equal, round, and reactive to light.  Neck:     Vascular: No JVD.  Cardiovascular:     Rate and Rhythm: Normal rate and regular rhythm.     Heart sounds: No murmur heard.  No friction rub. No gallop.   Pulmonary:     Effort: No respiratory distress.     Breath sounds: No wheezing.  Abdominal:     General: There is no distension.     Tenderness: There is no guarding or rebound.  Musculoskeletal:  General: Normal range of motion.     Cervical back: Normal range of motion and neck supple.  Skin:    Coloration: Skin is not pale.     Findings: No rash.  Neurological:     GCS: GCS eye subscore is 3. GCS verbal subscore is 1. GCS motor subscore is 5.     ED Results / Procedures / Treatments   Labs (all labs ordered are listed, but only abnormal results are displayed) Labs Reviewed  CBC WITH DIFFERENTIAL/PLATELET - Abnormal; Notable for the following components:      Result Value   WBC 15.1 (*)    Neutro Abs 8.1 (*)    Lymphs Abs 5.7 (*)    Monocytes Absolute 1.2 (*)    All other components within normal limits  BASIC METABOLIC PANEL - Abnormal; Notable for the following components:   Chloride 96 (*)    Glucose,  Bld 206 (*)    Anion gap 17 (*)    All other components within normal limits  ETHANOL - Abnormal; Notable for the following components:   Alcohol, Ethyl (B) 26 (*)    All other components within normal limits  CBG MONITORING, ED - Abnormal; Notable for the following components:   Glucose-Capillary 196 (*)    All other components within normal limits  SARS CORONAVIRUS 2 BY RT PCR (HOSPITAL ORDER, PERFORMED IN [facility])  PROTIME-INR  APTT  TYPE AND SCREEN  ABO/RH  PREPARE PLATELET PHERESIS    EKG None  Radiology CT Head Wo Contrast  Result Date: 06/29/2020 CLINICAL DATA:  74 year old male with head trauma. EXAM: CT HEAD WITHOUT CONTRAST CT CERVICAL SPINE WITHOUT CONTRAST TECHNIQUE: Multidetector CT imaging of the head and cervical spine was performed following the standard protocol without intravenous contrast. Multiplanar CT image reconstructions of the cervical spine were also generated. COMPARISON:  Head CT dated 06/14/2014. FINDINGS: Evaluation of this exam is limited due to motion artifact. CT HEAD FINDINGS Brain: There is moderate age-related atrophy and chronic microvascular ischemic changes. Bilateral frontoparietal subdural hemorrhages measuring up to 7 mm in thickness over the frontal lobes. There is extension of the subdural blood along the anterior falx. Left parietal subdural hemorrhage measures 6 mm in thickness. There is mild mass effect associated with this subdural hemorrhage on the adjacent brain parenchyma. Bifrontal subarachnoid hemorrhages as well as subarachnoid hemorrhage in the right parietal lobe. Diffuse left temporal subarachnoid hemorrhage. There is a focal area of hemorrhage measuring approximately 2.3 x 1.1 cm over the left posterior temporal lobe (17/1) which may represent combination of subarachnoid hemorrhage and intraparenchymal hemorrhage. Several small pockets of air associated with this hemorrhage consistent with overlying calvarial fracture.  No midline shift. Vascular: No hyperdense vessel or unexpected calcification. Skull: There is a nondisplaced fracture of the left parietal calvarium along the lambdoid suture extending into the left occipital calvarium. There is fracture of the temporal bone and mastoid air cells with extension of the fracture into the left external auditory canal. Dedicated temporal bone CT is recommended for further evaluation. Sinuses/Orbits: There is partial opacification of the left sphenoid sinus with air-fluid level and high attenuating content. Minimal left mastoid effusion. Other: There is left parietal scalp hematoma along the skull fracture. CT CERVICAL SPINE FINDINGS Alignment: No acute subluxation. There is straightening of normal cervical lordosis which may be positional or due to muscle spasm. Skull base and vertebrae: No acute fracture. Soft tissues and spinal canal: No prevertebral fluid or swelling. No visible canal  hematoma. Disc levels:  Multilevel degenerative changes with spurring. Upper chest: Negative. Other: Bilateral carotid bulb calcified plaques. IMPRESSION: 1. Nondisplaced fracture of the left parietal calvarium along the left lambdoid suture extending into the left occipital calvarium and left temporal bone with involvement of the left mastoid air cell and external auditory canal. Dedicated temporal bone CT is recommended for further evaluation. 2. Bilateral subdural and subarachnoid hemorrhages. No midline shift. 3. Focal hemorrhage with small pockets of pneumocephalus in the left posterior temporal lobe along the calvarial fracture. This likely represents combination of subarachnoid and intraparenchymal hemorrhage. 4. No acute/traumatic cervical spine pathology. These results were called by telephone at the time of interpretation on 06/29/2020 at 8:25 pm to provider Noland Pizano , who verbally acknowledged these results. Electronically Signed   By: Elgie Collard M.D.   On: 06/29/2020 20:28   CT  Cervical Spine Wo Contrast  Result Date: 06/29/2020 CLINICAL DATA:  74 year old male with head trauma. EXAM: CT HEAD WITHOUT CONTRAST CT CERVICAL SPINE WITHOUT CONTRAST TECHNIQUE: Multidetector CT imaging of the head and cervical spine was performed following the standard protocol without intravenous contrast. Multiplanar CT image reconstructions of the cervical spine were also generated. COMPARISON:  Head CT dated 06/14/2014. FINDINGS: Evaluation of this exam is limited due to motion artifact. CT HEAD FINDINGS Brain: There is moderate age-related atrophy and chronic microvascular ischemic changes. Bilateral frontoparietal subdural hemorrhages measuring up to 7 mm in thickness over the frontal lobes. There is extension of the subdural blood along the anterior falx. Left parietal subdural hemorrhage measures 6 mm in thickness. There is mild mass effect associated with this subdural hemorrhage on the adjacent brain parenchyma. Bifrontal subarachnoid hemorrhages as well as subarachnoid hemorrhage in the right parietal lobe. Diffuse left temporal subarachnoid hemorrhage. There is a focal area of hemorrhage measuring approximately 2.3 x 1.1 cm over the left posterior temporal lobe (17/1) which may represent combination of subarachnoid hemorrhage and intraparenchymal hemorrhage. Several small pockets of air associated with this hemorrhage consistent with overlying calvarial fracture. No midline shift. Vascular: No hyperdense vessel or unexpected calcification. Skull: There is a nondisplaced fracture of the left parietal calvarium along the lambdoid suture extending into the left occipital calvarium. There is fracture of the temporal bone and mastoid air cells with extension of the fracture into the left external auditory canal. Dedicated temporal bone CT is recommended for further evaluation. Sinuses/Orbits: There is partial opacification of the left sphenoid sinus with air-fluid level and high attenuating content.  Minimal left mastoid effusion. Other: There is left parietal scalp hematoma along the skull fracture. CT CERVICAL SPINE FINDINGS Alignment: No acute subluxation. There is straightening of normal cervical lordosis which may be positional or due to muscle spasm. Skull base and vertebrae: No acute fracture. Soft tissues and spinal canal: No prevertebral fluid or swelling. No visible canal hematoma. Disc levels:  Multilevel degenerative changes with spurring. Upper chest: Negative. Other: Bilateral carotid bulb calcified plaques. IMPRESSION: 1. Nondisplaced fracture of the left parietal calvarium along the left lambdoid suture extending into the left occipital calvarium and left temporal bone with involvement of the left mastoid air cell and external auditory canal. Dedicated temporal bone CT is recommended for further evaluation. 2. Bilateral subdural and subarachnoid hemorrhages. No midline shift. 3. Focal hemorrhage with small pockets of pneumocephalus in the left posterior temporal lobe along the calvarial fracture. This likely represents combination of subarachnoid and intraparenchymal hemorrhage. 4. No acute/traumatic cervical spine pathology. These results were called by telephone at the  time of interpretation on 06/29/2020 at 8:25 pm to provider Achilles Neville , who verbally acknowledged these results. Electronically Signed   By: Elgie CollardArash  Radparvar M.D.   On: 06/29/2020 20:28    Procedures Procedures (including critical care time)  Medications Ordered in ED Medications  0.9 %  sodium chloride infusion (Manually program via Guardrails IV Fluids) (has no administration in time range)  acetaminophen (TYLENOL) tablet 650 mg (has no administration in time range)  HYDROcodone-acetaminophen (NORCO/VICODIN) 5-325 MG per tablet 1 tablet (has no administration in time range)  ondansetron (ZOFRAN) tablet 4 mg (has no administration in time range)    Or  ondansetron (ZOFRAN) injection 4 mg (has no administration in  time range)  0.9 %  sodium chloride infusion (has no administration in time range)  levETIRAcetam (KEPPRA) tablet 500 mg (has no administration in time range)  levETIRAcetam (KEPPRA) IVPB 1000 mg/100 mL premix (0 mg Intravenous Stopped 06/29/20 2148)    ED Course  I have reviewed the triage vital signs and the nursing notes.  Pertinent labs & imaging results that were available during my care of the patient were reviewed by me and considered in my medical decision making (see chart for details).    MDM Rules/Calculators/A&P                          74 yo M with a cc of a fall.  Thought to have passed out.  Patient intoxicated and non verbal on arrival.  CT head and C spine.   CT scan of the head shows calvarial fracture. Patient also with subarachnoid subdural. Discussed with Dr. Jake Samplesawley, neurosurgery. Recommended admission to the ICU. Recommend starting on Keppra, giving a unit of platelets.  CRITICAL CARE Performed by: Rae Roamaniel Patrick Pilot Prindle   Total critical care time: 35 minutes  Critical care time was exclusive of separately billable procedures and treating other patients.  Critical care was necessary to treat or prevent imminent or life-threatening deterioration.  Critical care was time spent personally by me on the following activities: development of treatment plan with patient and/or surrogate as well as nursing, discussions with consultants, evaluation of patient's response to treatment, examination of patient, obtaining history from patient or surrogate, ordering and performing treatments and interventions, ordering and review of laboratory studies, ordering and review of radiographic studies, pulse oximetry and re-evaluation of patient's condition.  Final Clinical Impression(s) / ED Diagnoses Final diagnoses:  Traumatic hemorrhage of cerebrum with loss of consciousness of 30 minutes or less, unspecified laterality, initial encounter [facility](HCC)    Rx / DC Orders ED Discharge  Orders    None       Melene PlanFloyd, Keilin Gamboa, DO 06/29/20 2213

## 2020-06-29 NOTE — ED Triage Notes (Signed)
Pt arrived from a park via [place]. Pt had a witnessed syncope episode with a fall resulting in strike to his head. PT non verbal at this time and will not follow commands. Provider at bedside. Pt also appears to be bleeding from left ear. VSS at this time Pt withdraws from pain.

## 2020-06-29 NOTE — H&P (Signed)
Subjective:  Harold Mckee is a 74 y.o. male brought by EMS for a fall while walking his dog.  He was witnessed to fall in the park and hit his head and was altered therefore brought to the hospital.  He has a history of diabetes and high blood pressure, takes aspirin daily per his wife who is at the bedside.  Patient unable to provide history at this time due to his altered mental status from his left temporal contusion.  Labs did reveal positive EtOH as well.  Review of Systems Unable to obtain at this time   Physical Exam: Awake alert, eyes open spontaneously, tracking around the room. Noncommunicative at this time, not following commands Pupils equally round reactive to light EOMI Face symmetric Small amount of dried blood within the left external auditory meatus.  No signs of CSF. Small abrasion behind the left ear on the scalp. Moving all extremities spontaneously.  No focal weakness is noted.  GCS 10  Labs Lab Results  Component Value Date   CREATININE 1.17 06/29/2020   BUN 11 06/29/2020   NA 135 06/29/2020   K 3.9 06/29/2020   CL 96 (L) 06/29/2020   CO2 22 06/29/2020   Lab Results  Component Value Date   WBC 15.1 (H) 06/29/2020   HGB 14.3 06/29/2020   HCT 44.4 06/29/2020   MCV 89.3 06/29/2020   PLT 219 06/29/2020   No results found for: PTT No results found for: INR, PROTIME   Assessment/Plan: 1.  Left occipital parietotemporal calvarial fracture secondary to fall 2.  Left temporal contusion 3.  Bilateral frontal traumatic subarachnoid/subdural hematomas  -Admit to neuro ICU -Every hour neurochecks -Keppra 500 twice daily -N.p.o. -Blood pressure control -Platelets given in ED, HOLD ALL ANTICOAG -No acute neurosurgical intervention at this time -Will repeat CT brain in the a.m. with dedicated left temporal bone CT for evaluation of temporal bone fracture. -Monitor for CSF leak from EAM    Monia Pouch, DO Neurosurgeon [facility] Cell: [phone]

## 2020-06-30 ENCOUNTER — Inpatient Hospital Stay (HOSPITAL_COMMUNITY): Payer: Medicare HMO

## 2020-06-30 DIAGNOSIS — R569 Unspecified convulsions: Secondary | ICD-10-CM | POA: Diagnosis not present

## 2020-06-30 DIAGNOSIS — S066X0D Traumatic subarachnoid hemorrhage without loss of consciousness, subsequent encounter: Secondary | ICD-10-CM

## 2020-06-30 DIAGNOSIS — S06361A Traumatic hemorrhage of cerebrum, unspecified, with loss of consciousness of 30 minutes or less, initial encounter: Secondary | ICD-10-CM | POA: Diagnosis not present

## 2020-06-30 DIAGNOSIS — R0682 Tachypnea, not elsewhere classified: Secondary | ICD-10-CM

## 2020-06-30 DIAGNOSIS — G9340 Encephalopathy, unspecified: Secondary | ICD-10-CM

## 2020-06-30 LAB — CBC
HCT: 42.1 % (ref 39.0–52.0)
Hemoglobin: 13.6 g/dL (ref 13.0–17.0)
MCH: 28.8 pg (ref 26.0–34.0)
MCHC: 32.3 g/dL (ref 30.0–36.0)
MCV: 89 fL (ref 80.0–100.0)
Platelets: 184 10*3/uL (ref 150–400)
RBC: 4.73 MIL/uL (ref 4.22–5.81)
RDW: 12.7 % (ref 11.5–15.5)
WBC: 18.8 10*3/uL — ABNORMAL HIGH (ref 4.0–10.5)
nRBC: 0 % (ref 0.0–0.2)

## 2020-06-30 LAB — PROTIME-INR
INR: 1.2 (ref 0.8–1.2)
Prothrombin Time: 15.1 s (ref 11.4–15.2)

## 2020-06-30 LAB — URINALYSIS, ROUTINE W REFLEX MICROSCOPIC
Bacteria, UA: NONE SEEN
Bilirubin Urine: NEGATIVE
Glucose, UA: >=500 mg/dL — AB
Ketones, ur: 5 mg/dL — AB
Leukocytes,Ua: NEGATIVE
Nitrite: NEGATIVE
Protein, ur: NEGATIVE mg/dL
Specific Gravity, Urine: 1.018 (ref 1.005–1.030)
pH: 5 (ref 5.0–8.0)

## 2020-06-30 LAB — PROCALCITONIN: Procalcitonin: 1.06 ng/mL

## 2020-06-30 LAB — POCT I-STAT 7, (LYTES, BLD GAS, ICA,H+H)
Acid-base deficit: 14 mmol/L — ABNORMAL HIGH (ref 0.0–2.0)
Acid-base deficit: 6 mmol/L — ABNORMAL HIGH (ref 0.0–2.0)
Bicarbonate: 11.1 mmol/L — ABNORMAL LOW (ref 20.0–28.0)
Bicarbonate: 16.8 mmol/L — ABNORMAL LOW (ref 20.0–28.0)
Calcium, Ion: 1.16 mmol/L (ref 1.15–1.40)
Calcium, Ion: 1.18 mmol/L (ref 1.15–1.40)
HCT: 42 % (ref 39.0–52.0)
HCT: 37 % — ABNORMAL LOW (ref 39.0–52.0)
Hemoglobin: 14.3 g/dL (ref 13.0–17.0)
Hemoglobin: 12.6 g/dL — ABNORMAL LOW (ref 13.0–17.0)
O2 Saturation: 95 %
O2 Saturation: 93 %
Patient temperature: 99.1
Patient temperature: 102
Potassium: 3.8 mmol/L (ref 3.5–5.1)
Potassium: 3.9 mmol/L (ref 3.5–5.1)
Sodium: 134 mmol/L — ABNORMAL LOW (ref 135–145)
Sodium: 137 mmol/L (ref 135–145)
TCO2: 12 mmol/L — ABNORMAL LOW (ref 22–32)
TCO2: 18 mmol/L — ABNORMAL LOW (ref 22–32)
pCO2 arterial: 23.4 mmHg — ABNORMAL LOW (ref 32.0–48.0)
pCO2 arterial: 27 mmHg — ABNORMAL LOW (ref 32.0–48.0)
pH, Arterial: 7.284 — ABNORMAL LOW (ref 7.350–7.450)
pH, Arterial: 7.411 (ref 7.350–7.450)
pO2, Arterial: 82 mmHg — ABNORMAL LOW (ref 83.0–108.0)
pO2, Arterial: 72 mmHg — ABNORMAL LOW (ref 83.0–108.0)

## 2020-06-30 LAB — HEMOGLOBIN A1C
Hgb A1c MFr Bld: 8.5 % — ABNORMAL HIGH (ref 4.8–5.6)
Mean Plasma Glucose: 197.25 mg/dL

## 2020-06-30 LAB — BASIC METABOLIC PANEL
Anion gap: 22 — ABNORMAL HIGH (ref 5–15)
BUN: 10 mg/dL (ref 8–23)
CO2: 16 mmol/L — ABNORMAL LOW (ref 22–32)
Calcium: 9.4 mg/dL (ref 8.9–10.3)
Chloride: 94 mmol/L — ABNORMAL LOW (ref 98–111)
Creatinine, Ser: 1.03 mg/dL (ref 0.61–1.24)
GFR calc Af Amer: >60 mL/min (ref >60)
GFR calc non Af Amer: >60 mL/min (ref >60)
Glucose, Bld: 326 mg/dL — ABNORMAL HIGH (ref 70–99)
Potassium: 3.6 mmol/L (ref 3.5–5.1)
Sodium: 132 mmol/L — ABNORMAL LOW (ref 135–145)

## 2020-06-30 LAB — LACTIC ACID, PLASMA: Lactic Acid, Venous: 5.9 mmol/L (ref 0.5–1.9)

## 2020-06-30 LAB — STREP PNEUMONIAE URINARY ANTIGEN: Strep Pneumo Urinary Antigen: NEGATIVE

## 2020-06-30 LAB — MAGNESIUM: Magnesium: 1.4 mg/dL — ABNORMAL LOW (ref 1.7–2.4)

## 2020-06-30 LAB — GLUCOSE, CAPILLARY
Glucose-Capillary: 368 mg/dL — ABNORMAL HIGH (ref 70–99)
Glucose-Capillary: 347 mg/dL — ABNORMAL HIGH (ref 70–99)
Glucose-Capillary: 235 mg/dL — ABNORMAL HIGH (ref 70–99)
Glucose-Capillary: 262 mg/dL — ABNORMAL HIGH (ref 70–99)
Glucose-Capillary: 253 mg/dL — ABNORMAL HIGH (ref 70–99)

## 2020-06-30 LAB — PHOSPHORUS: Phosphorus: 3.4 mg/dL (ref 2.5–4.6)

## 2020-06-30 LAB — APTT: aPTT: 29 s (ref 24–36)

## 2020-06-30 LAB — MRSA PCR SCREENING: MRSA by PCR: NEGATIVE

## 2020-06-30 MED ORDER — PROSOURCE TF PO LIQD
90.0000 mL | Freq: Two times a day (BID) | ORAL | Status: DC
  Administered 2020-06-30 – 2020-07-25 (×48): 90 mL
  Filled 2020-06-30 (×50): qty 90

## 2020-06-30 MED ORDER — OSMOLITE 1.2 CAL PO LIQD
1000.0000 mL | ORAL | Status: DC
  Administered 2020-07-01 – 2020-07-07 (×7): 1000 mL
  Filled 2020-06-30 (×13): qty 1000

## 2020-06-30 MED ORDER — HYDROCHLOROTHIAZIDE 25 MG PO TABS
25.0000 mg | ORAL_TABLET | Freq: Every day | ORAL | Status: DC
  Administered 2020-06-30 – 2020-07-04 (×5): 25 mg
  Filled 2020-06-30 (×5): qty 1

## 2020-06-30 MED ORDER — LEVETIRACETAM IN NACL 1000 MG/100ML IV SOLN
1000.0000 mg | Freq: Two times a day (BID) | INTRAVENOUS | Status: DC
  Administered 2020-06-30 – 2020-07-15 (×31): 1000 mg via INTRAVENOUS
  Filled 2020-06-30 (×31): qty 100

## 2020-06-30 MED ORDER — SODIUM CHLORIDE 0.9 % IV SOLN
2.0000 g | Freq: Three times a day (TID) | INTRAVENOUS | Status: AC
  Administered 2020-06-30 – 2020-07-06 (×19): 2 g via INTRAVENOUS
  Filled 2020-06-30 (×21): qty 2

## 2020-06-30 MED ORDER — ACETAMINOPHEN 650 MG RE SUPP
650.0000 mg | RECTAL | Status: DC | PRN
  Administered 2020-06-30: 650 mg via RECTAL
  Filled 2020-06-30: qty 1

## 2020-06-30 MED ORDER — LABETALOL HCL 5 MG/ML IV SOLN
10.0000 mg | INTRAVENOUS | Status: AC | PRN
  Administered 2020-06-30 – 2020-07-04 (×10): 10 mg via INTRAVENOUS
  Filled 2020-06-30 (×9): qty 4

## 2020-06-30 MED ORDER — LORAZEPAM 2 MG/ML IJ SOLN
INTRAMUSCULAR | Status: AC
  Filled 2020-06-30: qty 1

## 2020-06-30 MED ORDER — CHLORHEXIDINE GLUCONATE CLOTH 2 % EX PADS
6.0000 | MEDICATED_PAD | Freq: Every day | CUTANEOUS | Status: DC
  Administered 2020-07-01 – 2020-07-12 (×11): 6 via TOPICAL

## 2020-06-30 MED ORDER — MAGNESIUM SULFATE 4 GM/100ML IV SOLN
4.0000 g | Freq: Once | INTRAVENOUS | Status: AC
  Administered 2020-06-30: 4 g via INTRAVENOUS
  Filled 2020-06-30: qty 100

## 2020-06-30 MED ORDER — LEVETIRACETAM IN NACL 500 MG/100ML IV SOLN
500.0000 mg | Freq: Two times a day (BID) | INTRAVENOUS | Status: DC
  Filled 2020-06-30: qty 100

## 2020-06-30 MED ORDER — SODIUM CHLORIDE 0.9 % IV BOLUS
1000.0000 mL | Freq: Once | INTRAVENOUS | Status: AC
  Administered 2020-06-30: 1000 mL via INTRAVENOUS

## 2020-06-30 MED ORDER — LORAZEPAM 2 MG/ML IJ SOLN
0.5000 mg | Freq: Once | INTRAMUSCULAR | Status: AC
  Administered 2020-06-30: 0.5 mg via INTRAVENOUS

## 2020-06-30 MED ORDER — ACETAMINOPHEN 325 MG PO TABS
650.0000 mg | ORAL_TABLET | ORAL | Status: DC | PRN
  Administered 2020-07-01 – 2020-07-16 (×19): 650 mg
  Filled 2020-06-30 (×18): qty 2

## 2020-06-30 MED ORDER — HYDRALAZINE HCL 20 MG/ML IJ SOLN
10.0000 mg | INTRAMUSCULAR | Status: DC | PRN
  Administered 2020-07-01 – 2020-07-03 (×3): 20 mg via INTRAVENOUS
  Administered 2020-07-04: 40 mg via INTRAVENOUS
  Administered 2020-07-04 – 2020-07-05 (×7): 20 mg via INTRAVENOUS
  Filled 2020-06-30: qty 2
  Filled 2020-06-30: qty 1
  Filled 2020-06-30: qty 2
  Filled 2020-06-30 (×2): qty 1
  Filled 2020-06-30: qty 2
  Filled 2020-06-30: qty 1
  Filled 2020-06-30: qty 2
  Filled 2020-06-30: qty 1
  Filled 2020-06-30: qty 2

## 2020-06-30 NOTE — Progress Notes (Signed)
EEG complete - results pending 

## 2020-06-30 NOTE — Procedures (Signed)
Cortrak  Person Inserting Tube:  Renie Ora, RD Tube Type:  Cortrak - 43 inches Tube Location:  Left nare Initial Placement:  Stomach Secured by: Bridle Technique Used to Measure Tube Placement:  Documented cm marking at nare/ corner of mouth Cortrak Secured At:  70 cm Procedure Comments:  Cortrak Tube Team Note:  Consult received to place a Cortrak feeding tube.   X-ray is required, abdominal x-ray has been ordered by the Cortrak team. Please confirm tube placement before using the Cortrak tube.   If the tube becomes dislodged please keep the tube and contact the Cortrak team at [email or web address] ([email or web address]) for replacement.  If after hours and replacement cannot be delayed, place a NG tube and confirm placement with an abdominal x-ray.      Trenton Gammon, MS, RD, LDN, CNSC Inpatient Clinical Dietitian RD pager # available in [facility]  After hours/weekend pager # available in [facility]

## 2020-06-30 NOTE — Progress Notes (Signed)
NAME:  Harold Mckee, MRN:  [id number], DOB:  02/04/1946, LOS: 1 ADMISSION DATE:  06/29/2020, CONSULTATION DATE:  06/29/2020 REFERRING MD:  Dr. Jake Samples, CHIEF COMPLAINT:  Bilateral subarachnoid/subdural hematomas   Brief History   74 year old male seen after witnessed fall, patient was seen drinking alcohol prior to fall who was found to have left scull fracture and bilateral subarachnoid/subdural hematomas. PCCM consulted for assistance in admission.   History of present illness   Harold Mckee is a 74yo make with PMX significant for HTN and Diabetes who presented after ground level fall resulting in head injury while walking his dog in the park. Per EMS patient was seen drinking alcohol prior to fall.   On arrival patient was seen bleeding from his left ear. Vitals on arrival significant for mild tachypnea and hypertension. Labwork significant for hyperglycemia and mild leukocytosis. CT C-spin and head positive for  left scull fracture and bilateral subarachnoid/subdural hematomas. PCCM consulted for assistance in admission.   Past Medical History  HTN  Diabetes   Significant Hospital Events   Admitted 7/13  Consults:  Neurosurgery   Procedures:    Significant Diagnostic Tests:  CT C-spine and head 7/13 > 1. Nondisplaced fracture of the left parietal calvarium along the left lambdoid suture extending into the left occipital calvarium and left temporal bone with involvement of the left mastoid air cell and external auditory canal. Dedicated temporal bone CT is recommended for further evaluation. 2. Bilateral subdural and subarachnoid hemorrhages. No midline shift. 3. Focal hemorrhage with small pockets of pneumocephalus in the left posterior temporal lobe along the calvarial fracture. This likely represents combination of subarachnoid and intraparenchymal hemorrhage. 4. No acute/traumatic cervical spine pathology. CT head 7/14 > blooming contusions in bilateral inferior  frontal, bilateral, inferior temporal, and left parieto-occipital lobes.  Generalized increased in SAH, mild IVH without hydrocephalus.  Progressive scalp swelling over left calvarial fracture.  No pneumocephalus. CT temporal bones 7/14 > longitudinal left temporal bone fx.  Hemotympanum  On left with pneuocephalus and soft tissue emphysema. EEG 7/14 >   Micro Data:  COVID 7/13 > negative   Antimicrobials:     Interim history/subjective:  No acute events. Possible seizure activity.  Objective   Blood pressure 125/64, pulse (!) 118, temperature (!) 101.9 F (38.8 C), temperature source Axillary, resp. rate (!) 35, weight 72.4 kg, SpO2 99 %.        Intake/Output Summary (Last 24 hours) at 06/30/2020 0853 Last data filed at 06/30/2020 0650 Gross per 24 hour  Intake 1693.76 ml  Output 1000 ml  Net 693.76 ml   Filed Weights   06/29/20 2344  Weight: 72.4 kg    Examination: General: Adult male, chronically ill appearing, resting in bed, in NAD. Neuro: Awake but altered and not following commands.  Intermittent whole body shaking, possibly seizures. HEENT: [place]/AT. Sclerae anicteric. EOMI. Cardiovascular: RRR, no M/R/G.  Lungs: Respirations even and unlabored.  CTA bilaterally, No W/R/R.  Abdomen: BS x 4, soft, NT/ND.  Musculoskeletal: No gross deformities, no edema.  Skin: Intact, warm, no rashes.   Assessment & Plan:   Mechanical fall resulting in Left skull fracture and bilateral subarachnoid/subdural hematomas.  P: Management per neurosurgery   Possible seizure activity. P: 0.5mg  ativan now, will be cautious as do not want to affect respiratory effort Continue Keppra STAT EEG  Hypertension.  P: SBP goal less than 140 Cleviprex drip   Diabetes - diet controlled. P: SSI   Mild leukocytosis - presumed reactive, no signs  of infection. P:  Trend CBC and fever curve   Alcohol intoxication. -ETOH level 26 on arrival, unsure if this is acute vs chronic P: CIWA  q4 hrs Monitor in the ICU setting  Supplement thiamin, folate, and vitamin B12   Best practice:  Diet: NPO Pain/Anxiety/Delirium protocol (if indicated): PRN  VAP protocol (if indicated): N/A DVT prophylaxis: SCD GI prophylaxis: PPI Glucose control: SSI Mobility: Bedrest  Code Status: Full Family Communication: Per primary  Disposition: ICU  CC time: 30 min.   Rutherford Guys, [place] [facility] Pulmonary & Critical Care Medicine 06/30/2020, 9:05 AM

## 2020-06-30 NOTE — Progress Notes (Signed)
RN says new fever  WBC high  Plan  - pan culture - UA and blood  - empiric abx  - check PCT. ,lactate, urine strep and urine legionellla     SIGNATURE    Dr. Kalman Shan, M.D., F.C.C.P,  Pulmonary and Critical Care Medicine Staff Physician, [facility] Director - Interstitial Lung Disease  Program  Pulmonary Fibrosis [facility] Network at [facility] [place], [place], [address]  Pager: [phone], If no answer or between  15:00h - 7:00h: call [phone] Telephone: [phone]  11:21 AM 06/30/2020

## 2020-06-30 NOTE — Progress Notes (Signed)
Subjective: No new complaints, sleepy but moving spontaneously in all 4  Objective: PE: Lethargic, noncommunicative Eyes open intermittently to pain Pupils equally round reactive to light Face symmetric Localizing strongly x4    Assessment/Plan: 1.  Left occipital parietotemporal calvarial fracture secondary to fall 2.  Left temporal contusion 3.  Bilateral frontal traumatic subarachnoid/subdural hematomas 4. ? Sz  -neuro ICU -Every hour neurochecks -Keppra increased to 1000 mg twice daily -N.p.o. -Blood pressure control -Platelets for asa reversal, HOLD ALL ANTICOAG, platelets pending due to antibody will be given today -No acute neurosurgical intervention at this time -CT brain this morning revealed blossoming of bifrontal contusions and subarachnoid hemorrhage without significant mass-effect.  Calvarial fracture is stable.  Continue medical management at this time. -Monitor for EtOH withdrawal -Monitor for CSF leak from EAM  LOS: 1 day    Monia Pouch, DO Neurosurgeon [facility] Cell: [phone]  06/30/2020, 11:00 AM

## 2020-06-30 NOTE — Progress Notes (Signed)
Initial Nutrition Assessment  DOCUMENTATION CODES:   Not applicable  INTERVENTION:   Initiate tube feeding via Cortrak tube; tip gastric: Osmolite 1.2 at 35 ml/h and increase by 10 ml every 8 hours to goal of 65 ml/hr (1560 ml per day) Prosource TF 90 ml BID  Provides 2032 kcal, 130 gm protein, 1265 ml free water daily   NUTRITION DIAGNOSIS:   Inadequate oral intake related to inability to eat as evidenced by NPO status.  GOAL:   Patient will meet greater than or equal to 90% of their needs  MONITOR:   TF tolerance, Diet advancement  REASON FOR ASSESSMENT:   Consult Enteral/tube feeding initiation and management  ASSESSMENT:   Pt with PMH of HTN and DM admitted after fall while walking his dog with L skull fx and bilateral SAH/SDH.   Pt discussed during ICU rounds and with RN.  ETOH 26 on admission, per MD unsure if use is acute or chronic.  No family present, pt unable to answer any questions. Present during tube placement, pt was not verbal during placement.  Pt with new fevers >103.  EEG planned  7/14 cortrak placement; tip gastric   Medications reviewed and include: folic acid, SSI, MVI, thiamine  Cleviprex @ 18 mg/hr provides: 1728 kcal  Labs reviewed CBG's: [phone]   Diet Order:   Diet Order            Diet NPO time specified  Diet effective now                 EDUCATION NEEDS:   No education needs have been identified at this time  Skin:  Skin Assessment: Reviewed RN Assessment  Last BM:  unknown  Height:   Ht Readings from Last 1 Encounters:  06/30/20 6\' 1"  (1.854 m)    Weight:   Wt Readings from Last 1 Encounters:  06/30/20 72.4 kg    Ideal Body Weight:  83.6 kg  BMI:  Body mass index is 21.06 kg/m.  Estimated Nutritional Needs:   Kcal:  1900-2100  Protein:  120-130 grams  Fluid:  2 L/day  07/02/20., RD, LDN, CNSC See AMiON for contact information

## 2020-06-30 NOTE — Progress Notes (Signed)
Pt having significant amount of blood coming from L ear.  Blood is seeming to clot in ear canal.  No CSF noted.  RN notified Dr. Maisie Fus (CC[facility]) of this.  Mentioned possible ENT consult.  Will clean up and bandage for now.

## 2020-06-30 NOTE — Progress Notes (Signed)
Questionable seizure activity noted by RN.  Pt shaking w/ left gaze.  Notified CCM PA and neurosurgery of this.  0.5mg  of Ativan given, keppra dose increase and EEG ordered at this time.

## 2020-06-30 NOTE — Progress Notes (Signed)
SLP Cancellation Note  Patient Details Name: Harold Mckee MRN: [id number] DOB: [birth year]   Cancelled treatment:       Reason Eval/Treat Not Completed: Medical issues which prohibited therapy. Pt is currently undergoing EEG with concern for seizures. NOt ready for SLP evaluation per RN. Will f/u as able.    Mahala Menghini., M.A. CCC-SLP Acute Rehabilitation Services Pager [phone] Office [phone]  06/30/2020, 10:28 AM

## 2020-06-30 NOTE — Procedures (Signed)
Patient Name: Harold Mckee  MRN: [id number]  Epilepsy Attending: Charlsie Quest  Referring Physician/Provider: Rutherford Guys, PA Date: 06/30/2020 Duration: 24.28 mins  Patient history: 74yo M with SAH and seizure like activity. EEG to evaluate for seizure.   Level of alertness: Awake/ lethargic  AEDs during EEG study: LEV, ativan  Technical aspects: This EEG study was done with scalp electrodes positioned according to the 10-20 International system of electrode placement. Electrical activity was acquired at a sampling rate of 500Hz  and reviewed with a high frequency filter of 70Hz  and a low frequency filter of 1Hz . EEG data were recorded continuously and digitally stored.   Description: EEG showed continuous generalized 3 to 6 Hz theta-delta slowing admixed with 15 to 18 Hz, 2-3 uV beta activity with irregular morphology distributed symmetrically and diffusely.  Hyperventilation and photic stimulation were not performed.     ABNORMALITY -Continuous slow, generalized -Excessive beta, generalized  IMPRESSION: This study is suggestive of moderate diffuse encephalopathy, nonspecific etiology.The excessive beta activity seen in the background is most likely due to the effect of benzodiazepine and is a benign EEG pattern. No seizures or epileptiform discharges were seen throughout the recording.   Kraig Genis 

## 2020-06-30 NOTE — Progress Notes (Signed)
Family at bedside.  Updated by RN and CCM MD, Dr. Marchelle Gearing.  All questions answered at this time.  Family brought list of medications in, going over with pharmacy.    Pt has new fever of 103.1.  Tylenol given at 0900 with no relief.  Ice packs and fan applied to pt at this time.  RN notified Dr. Marchelle Gearing of this.  New orders placed.

## 2020-06-30 NOTE — Progress Notes (Signed)
PT Cancellation Note  Patient Details Name: Harold Mckee MRN: [id number] DOB: Aug 28, 1946   Cancelled Treatment:    Reason Eval/Treat Not Completed: Medical issues which prohibited therapy. RN requesting PT hold at this time due to concerns for seizures. RN reports pt awaiting EEG. PT will hold until pt is more medically stable and is better able to participate in skilled PT assessment and intervention.    Arlyss Gandy 06/30/2020, 9:00 AM

## 2020-06-30 NOTE — Progress Notes (Signed)
eLink Physician-Brief Progress Note Patient Name: Harold Mckee DOB: 10-22-1946 MRN: [id number]   Date of Service  06/30/2020  HPI/Events of Note  Nursing concerned about: 1. Sinus tachycardia with HR = 128 and RR = 34-40.   eICU Interventions  Plan: 1. Portable CXR STAT. 2. ABG STAT. 3. 0.9 NaCl 1 liter IV over 1 hour now.      Intervention Category Major Interventions: Arrhythmia - evaluation and management;Other:  Lenell Antu 06/30/2020, 3:57 AM

## 2020-06-30 NOTE — Progress Notes (Signed)
Notified CCM regarding tachycardia and tachypnea. Neuro exam remains unchanged.

## 2020-07-01 ENCOUNTER — Inpatient Hospital Stay (HOSPITAL_COMMUNITY): Payer: Medicare HMO

## 2020-07-01 DIAGNOSIS — S06361A Traumatic hemorrhage of cerebrum, unspecified, with loss of consciousness of 30 minutes or less, initial encounter: Secondary | ICD-10-CM

## 2020-07-01 LAB — GLUCOSE, CAPILLARY
Glucose-Capillary: 270 mg/dL — ABNORMAL HIGH (ref 70–99)
Glucose-Capillary: 283 mg/dL — ABNORMAL HIGH (ref 70–99)
Glucose-Capillary: 308 mg/dL — ABNORMAL HIGH (ref 70–99)
Glucose-Capillary: 264 mg/dL — ABNORMAL HIGH (ref 70–99)
Glucose-Capillary: 223 mg/dL — ABNORMAL HIGH (ref 70–99)
Glucose-Capillary: 290 mg/dL — ABNORMAL HIGH (ref 70–99)

## 2020-07-01 LAB — PREPARE PLATELET PHERESIS: Unit division: 0

## 2020-07-01 LAB — CBC
HCT: 36.1 % — ABNORMAL LOW (ref 39.0–52.0)
Hemoglobin: 12 g/dL — ABNORMAL LOW (ref 13.0–17.0)
MCH: 29 pg (ref 26.0–34.0)
MCHC: 33.2 g/dL (ref 30.0–36.0)
MCV: 87.2 fL (ref 80.0–100.0)
Platelets: 183 10*3/uL (ref 150–400)
RBC: 4.14 MIL/uL — ABNORMAL LOW (ref 4.22–5.81)
RDW: 13.2 % (ref 11.5–15.5)
WBC: 19 10*3/uL — ABNORMAL HIGH (ref 4.0–10.5)
nRBC: 0 % (ref 0.0–0.2)

## 2020-07-01 LAB — LACTIC ACID, PLASMA: Lactic Acid, Venous: 3.8 mmol/L (ref 0.5–1.9)

## 2020-07-01 LAB — BPAM PLATELET PHERESIS
Blood Product Expiration Date: 202107162359
ISSUE DATE / TIME: 202107140844
Unit Type and Rh: 7300

## 2020-07-01 LAB — TRIGLYCERIDES: Triglycerides: 278 mg/dL — ABNORMAL HIGH (ref <150)

## 2020-07-01 LAB — PHOSPHORUS
Phosphorus: 2.3 mg/dL — ABNORMAL LOW (ref 2.5–4.6)
Phosphorus: 2.5 mg/dL (ref 2.5–4.6)

## 2020-07-01 LAB — MAGNESIUM: Magnesium: 2 mg/dL (ref 1.7–2.4)

## 2020-07-01 MED ORDER — POLYETHYLENE GLYCOL 3350 17 G PO PACK
17.0000 g | PACK | Freq: Every day | ORAL | Status: DC
  Administered 2020-07-01 – 2020-07-06 (×6): 17 g
  Filled 2020-07-01 (×5): qty 1

## 2020-07-01 MED ORDER — MIDAZOLAM HCL 2 MG/2ML IJ SOLN
1.0000 mg | Freq: Once | INTRAMUSCULAR | Status: AC
  Administered 2020-07-01: 1 mg via INTRAVENOUS

## 2020-07-01 MED ORDER — DOCUSATE SODIUM 50 MG/5ML PO LIQD
100.0000 mg | Freq: Two times a day (BID) | ORAL | Status: DC | PRN
  Administered 2020-07-01: 100 mg
  Filled 2020-07-01: qty 10

## 2020-07-01 MED ORDER — ORAL CARE MOUTH RINSE
15.0000 mL | OROMUCOSAL | Status: DC
  Administered 2020-07-01 – 2020-07-26 (×246): 15 mL via OROMUCOSAL

## 2020-07-01 MED ORDER — CHLORHEXIDINE GLUCONATE 0.12% ORAL RINSE (MEDLINE KIT)
15.0000 mL | Freq: Two times a day (BID) | OROMUCOSAL | Status: DC
  Administered 2020-07-01 – 2020-07-26 (×51): 15 mL via OROMUCOSAL

## 2020-07-01 MED ORDER — FENTANYL CITRATE (PF) 100 MCG/2ML IJ SOLN
INTRAMUSCULAR | Status: AC
  Filled 2020-07-01: qty 2

## 2020-07-01 MED ORDER — POLYETHYLENE GLYCOL 3350 17 G PO PACK: 17.0000 g | PACK | Freq: Every day | ORAL | Status: DC | PRN

## 2020-07-01 MED ORDER — FENTANYL CITRATE (PF) 100 MCG/2ML IJ SOLN
25.0000 ug | INTRAMUSCULAR | Status: DC | PRN
  Administered 2020-07-01: 25 ug via INTRAVENOUS
  Filled 2020-07-01: qty 2

## 2020-07-01 MED ORDER — FENTANYL CITRATE (PF) 100 MCG/2ML IJ SOLN
50.0000 ug | Freq: Once | INTRAMUSCULAR | Status: AC
  Administered 2020-07-01: 50 ug via INTRAVENOUS

## 2020-07-01 MED ORDER — INSULIN ASPART 100 UNIT/ML ~~LOC~~ SOLN
0.0000 [IU] | SUBCUTANEOUS | Status: DC
  Administered 2020-07-01: 11 [IU] via SUBCUTANEOUS
  Administered 2020-07-01: 8 [IU] via SUBCUTANEOUS
  Administered 2020-07-01: 5 [IU] via SUBCUTANEOUS
  Administered 2020-07-02: 8 [IU] via SUBCUTANEOUS
  Administered 2020-07-02 (×2): 11 [IU] via SUBCUTANEOUS
  Administered 2020-07-02: 5 [IU] via SUBCUTANEOUS
  Administered 2020-07-02 – 2020-07-03 (×4): 8 [IU] via SUBCUTANEOUS

## 2020-07-01 MED ORDER — AMLODIPINE BESYLATE 5 MG PO TABS
5.0000 mg | ORAL_TABLET | Freq: Every day | ORAL | Status: DC
  Administered 2020-07-01 – 2020-07-04 (×4): 5 mg
  Filled 2020-07-01 (×4): qty 1

## 2020-07-01 MED ORDER — MIDAZOLAM HCL 2 MG/2ML IJ SOLN
INTRAMUSCULAR | Status: AC
  Filled 2020-07-01: qty 2

## 2020-07-01 MED ORDER — FOLIC ACID 1 MG PO TABS
1.0000 mg | ORAL_TABLET | Freq: Every day | ORAL | Status: DC
  Administered 2020-07-01 – 2020-07-05 (×5): 1 mg
  Filled 2020-07-01 (×4): qty 1

## 2020-07-01 MED ORDER — FENTANYL CITRATE (PF) 100 MCG/2ML IJ SOLN
25.0000 ug | INTRAMUSCULAR | Status: DC | PRN
  Administered 2020-07-04 – 2020-07-05 (×2): 100 ug via INTRAVENOUS
  Filled 2020-07-01 (×2): qty 2

## 2020-07-01 MED ORDER — DEXMEDETOMIDINE HCL IN NACL 400 MCG/100ML IV SOLN
0.0000 ug/kg/h | INTRAVENOUS | Status: AC
  Administered 2020-07-01: 0.4 ug/kg/h via INTRAVENOUS
  Administered 2020-07-02: 0.2 ug/kg/h via INTRAVENOUS
  Administered 2020-07-03 – 2020-07-04 (×2): 0.4 ug/kg/h via INTRAVENOUS
  Administered 2020-07-04: 0.6 ug/kg/h via INTRAVENOUS
  Filled 2020-07-01 (×6): qty 100

## 2020-07-01 MED ORDER — INSULIN ASPART 100 UNIT/ML ~~LOC~~ SOLN
2.0000 [IU] | SUBCUTANEOUS | Status: DC
  Administered 2020-07-01 – 2020-07-02 (×7): 2 [IU] via SUBCUTANEOUS

## 2020-07-01 MED ORDER — THIAMINE HCL 100 MG/ML IJ SOLN
100.0000 mg | Freq: Every day | INTRAMUSCULAR | Status: DC
  Filled 2020-07-01 (×2): qty 2

## 2020-07-01 MED ORDER — THIAMINE HCL 100 MG PO TABS
100.0000 mg | ORAL_TABLET | Freq: Every day | ORAL | Status: DC
  Administered 2020-07-02 – 2020-07-05 (×4): 100 mg
  Filled 2020-07-01 (×4): qty 1

## 2020-07-01 MED ORDER — ROCURONIUM BROMIDE 50 MG/5ML IV SOLN
60.0000 mg | Freq: Once | INTRAVENOUS | Status: AC
  Administered 2020-07-01: 60 mg via INTRAVENOUS
  Filled 2020-07-01: qty 6

## 2020-07-01 MED ORDER — PHENYLEPHRINE 40 MCG/ML (10ML) SYRINGE FOR IV PUSH (FOR BLOOD PRESSURE SUPPORT)
PREFILLED_SYRINGE | INTRAVENOUS | Status: AC
  Filled 2020-07-01: qty 10

## 2020-07-01 MED ORDER — ETOMIDATE 2 MG/ML IV SOLN
20.0000 mg | Freq: Once | INTRAVENOUS | Status: AC
  Administered 2020-07-01: 20 mg via INTRAVENOUS

## 2020-07-01 MED ORDER — PHENYLEPHRINE HCL-NACL 10-0.9 MG/250ML-% IV SOLN
INTRAVENOUS | Status: AC
  Filled 2020-07-01: qty 250

## 2020-07-01 MED ORDER — DOCUSATE SODIUM 50 MG/5ML PO LIQD
100.0000 mg | Freq: Two times a day (BID) | ORAL | Status: DC
  Administered 2020-07-01 – 2020-07-24 (×28): 100 mg
  Filled 2020-07-01 (×29): qty 10

## 2020-07-01 MED ORDER — PANTOPRAZOLE SODIUM 40 MG IV SOLR
40.0000 mg | Freq: Every day | INTRAVENOUS | Status: DC
  Administered 2020-07-01 – 2020-07-03 (×3): 40 mg via INTRAVENOUS
  Filled 2020-07-01 (×3): qty 40

## 2020-07-01 MED ORDER — ADULT MULTIVITAMIN W/MINERALS CH
1.0000 | ORAL_TABLET | Freq: Every day | ORAL | Status: DC
  Administered 2020-07-01 – 2020-07-25 (×25): 1
  Filled 2020-07-01 (×24): qty 1

## 2020-07-01 NOTE — Progress Notes (Signed)
PT Cancellation Note  Patient Details Name: Harold Mckee MRN: [id number] DOB: 01/10/1946   Cancelled Treatment:    Reason Eval/Treat Not Completed: Patient's level of consciousness . Per discussion with RN pt is not following commands, minimally responsive at this time. PT will attempt to follow up at a later time.  Arlyss Gandy 07/01/2020, 10:31 AM

## 2020-07-01 NOTE — Procedures (Signed)
Intubation Procedure Note  Harold Mckee  [id number]  01-28-1946  Date:07/01/20  Time:7:55 PM   Provider Performing:Alize Acy F Earlene Plater    Procedure: Intubation (31500)  Indication(s) Respiratory Failure  Consent Unable to obtain consent due to emergent nature of procedure.   Anesthesia Etomidate, Versed, Fentanyl and Rocuronium   Time Out Verified patient identification, verified procedure, site/side was marked, verified correct patient position, special equipment/implants available, medications/allergies/relevant history reviewed, required imaging and test results available.   Sterile Technique Usual hand hygeine, masks, and gloves were used   Procedure Description Patient positioned in bed supine.  Sedation given as noted above.  Patient was intubated with endotracheal tube using Glidescope.  View was Grade 2 only posterior commissure .  Number of attempts was 1.  Colorimetric CO2 detector was consistent with tracheal placement.   Complications/Tolerance None; patient tolerated the procedure well. Chest X-ray is ordered to verify placement.   EBL None  Specimen(s) None  [name], NP-C [facility] Contact / Pager information can be found on Amion  07/01/2020, 7:57 PM

## 2020-07-01 NOTE — Progress Notes (Addendum)
NAME:  Harold Mckee, MRN:  [id number], DOB:  December 10, 1946, LOS: 2 ADMISSION DATE:  06/29/2020, CONSULTATION DATE:  06/29/2020 REFERRING MD:  Dr. Jake Samples, CHIEF COMPLAINT:  Bilateral subarachnoid/subdural hematomas   Brief History   74 year old male seen after witnessed fall, patient was seen drinking alcohol prior to fall who was found to have left scull fracture and bilateral subarachnoid/subdural hematomas. PCCM consulted for assistance in admission.   History of present illness   Harold Mckee is a 74yo make with PMX significant for HTN and Diabetes who presented after ground level fall resulting in head injury while walking his [place] in the park. Per EMS patient was seen drinking alcohol prior to fall.   On arrival patient was seen bleeding from his left ear. Vitals on arrival significant for mild tachypnea and hypertension. Labwork significant for hyperglycemia and mild leukocytosis. CT C-spin and head positive for  left scull fracture and bilateral subarachnoid/subdural hematomas. PCCM consulted for assistance in admission.   Past Medical History  HTN  Diabetes   Significant Hospital Events   Admitted 7/13  Consults:  Neurosurgery   Procedures:    Significant Diagnostic Tests:  CT C-spine and head 7/13 > 1. Nondisplaced fracture of the left parietal calvarium along the left lambdoid suture extending into the left occipital calvarium and left temporal bone with involvement of the left mastoid air cell and external auditory canal. Dedicated temporal bone CT is recommended for further evaluation. 2. Bilateral subdural and subarachnoid hemorrhages. No midline shift. 3. Focal hemorrhage with small pockets of pneumocephalus in the left posterior temporal lobe along the calvarial fracture. This likely represents combination of subarachnoid and intraparenchymal hemorrhage. 4. No acute/traumatic cervical spine pathology. CT head 7/14 > blooming contusions in bilateral inferior  frontal, bilateral, inferior temporal, and left parieto-occipital lobes.  Generalized increased in SAH, mild IVH without hydrocephalus.  Progressive scalp swelling over left calvarial fracture.  No pneumocephalus. CT temporal bones 7/14 > longitudinal left temporal bone fx.  Hemotympanum  On left with pneuocephalus and soft tissue emphysema. EEG 7/14 > mod diffuse encephalopathy.  No seizures or epileptiform activity noted.  Micro Data:  COVID 7/13 > negative  Blood 7/13 >   Antimicrobials:   Cefepime 7/13 >   Interim history/subjective:  Started on empiric cefepime 7/14 due to fever and leukocytosis.  PCT 1.06 and lactate 5.9. Remains on cleviprex at 20 Mental status about the same, minimally responsive. CT head read pending.  Objective   Blood pressure 128/70, pulse (!) 104, temperature 99.4 F (37.4 C), temperature source Axillary, resp. rate (!) 29, height 6\' 1"  (1.854 m), weight 73.7 kg, SpO2 96 %.        Intake/Output Summary (Last 24 hours) at 07/01/2020 0756 Last data filed at 07/01/2020 0600 Gross per 24 hour  Intake 3470.01 ml  Output 2400 ml  Net 1070.01 ml   Filed Weights   06/29/20 2344 06/30/20 1300 07/01/20 0500  Weight: 72.4 kg 72.4 kg 73.7 kg    Examination: General: Adult male, chronically ill appearing, resting in bed, in NAD. Neuro: Not responding to verbal stimuli and not following any commands. HEENT: Scalp dressing C/D/I.  Sclerae anicteric.  Cardiovascular: RRR, no M/R/G.  Lungs: Respirations even and unlabored.  CTA bilaterally, No W/R/R.  Abdomen: BS x 4, soft, NT/ND.  Musculoskeletal: No gross deformities, no edema.  Skin: Intact, warm, no rashes.   Assessment & Plan:   Mechanical fall resulting in Left skull fracture and bilateral subarachnoid/subdural hematomas.  P: Management per  neurosurgery   Possible seizure activity - EEG negative. P: Continue to monitor Continue Keppra  Hypertension - remains on high dose  cleviprex. P: Continue cleviprex as needed to maintain SBP goal less than 140 Add norvasc Continue home HCTZ Hydralazine and Labetalol PRN  Diabetes - diet controlled. P: SSI - change to mod scale and add tube feed coverage  Sepsis - unclear etiology at this point. P:  Continue empiric cefepime for now. Follow cultures and PCT.  Alcohol intoxication. -ETOH level 26 on arrival, unsure if this is acute vs chronic P: CIWA q4 hrs Monitor in the ICU setting  Supplement thiamin, folate, and vitamin B12   Best practice:  Diet: NPO Pain/Anxiety/Delirium protocol (if indicated): PRN  VAP protocol (if indicated): N/A DVT prophylaxis: SCD GI prophylaxis: PPI Glucose control: SSI Mobility: Bedrest  Code Status: Full Family Communication: Per primary  Disposition: ICU  CC time: 30 min.   Rutherford Guys, [place] [facility] 07/01/2020, 7:56 AM

## 2020-07-01 NOTE — Progress Notes (Signed)
Patient transported on ventilator to CT and back to 4N18. No complications noted. Vitals stable throughout trip.

## 2020-07-01 NOTE — Progress Notes (Signed)
Subjective: Lethargic, protecting airway, L ear has bleeding from EAC per RN, dressing applied overnight  Objective: PE: Lethargic, noncommunicative Eyes closed Midline gaze Pupils equally round reactive to light Face symmetric Localizing strongly BUE, WD to pain in LEs Clotted blood in L EAC around dressing, no active bleeding or csf noted   Assessment/Plan: 1. Left occipital parietotemporal calvarial fracture secondary to fall 2.Left temporal contusion 3.Bilateral frontal traumatic subarachnoid/subdural hematomas 4. TBI   -neuro ICU -Every hour neurochecks -Keppra 1000 mg twice daily -N.p.o., coretrak feeds -Blood pressure control -HOLD ALL ANTICOAG -No acute neurosurgical intervention at this time -CT brain this morning revealed some minor continued blossoming of bifrontal contusions and subarachnoid hemorrhage without significant mass-effect. Continue medical management at this time. I do not believe an ICP monitor is warranted at this time as the mass effect from the contusions are local and the overall sulci and basal cisterns remain open  -Monitor for EtOH withdrawal -Monitor for CSF leak from EAM -consult Dr. Suszanne Conners, ENT for eval of continued EAC bleeding   LOS: 1 day    Monia Pouch, DO Neurosurgeon [facility] Cell: [phone] 07/01/2020 0900

## 2020-07-01 NOTE — Progress Notes (Signed)
Istat machine not transferring data at this time.  ABG results:  pH: 7.44  CO2: 31.6   PaO2: 235   HCO3: 21.3  Results called into ELink to [place], Charity fundraiser at 2100.

## 2020-07-01 NOTE — Progress Notes (Signed)
SLP Cancellation Note  Patient Details Name: Harold Mckee MRN: [id number] DOB: [birth year]   Cancelled treatment:       Reason Eval/Treat Not Completed: Patient's level of consciousness   Towanda Hornstein, Riley Nearing 07/01/2020, 9:38 AM

## 2020-07-01 NOTE — Consult Note (Addendum)
Reason for Consult: Left temporal bone fracture Referring Physician: Dr. Pieter Partridge Dawley  HPI:  Harold Mckee is an 74 y.o. male who was admitted on 06/29/20 after a fall. The fall was witnessed. He was found to have altered mental status after the fall, with bleeding from the left ear. He was brought to the ED for evaluation. His CT scan shows left longitudinal temporal bone fracture with possible separation of the malleus and incus.  Past Medical History:  Diagnosis Date  . Diabetes mellitus without complication ([place])   . Hypertension     History reviewed. No pertinent surgical history.  No family history on file.  Social History:  has no history on file for tobacco use, alcohol use, and drug use.  Allergies: No Known Allergies  Prior to Admission medications   Medication Sig Start Date End Date Taking? Authorizing Provider  amLODipine (NORVASC) 10 MG tablet Take 10 mg by mouth daily.   Yes [provider]  Apoaequorin (PREVAGEN PO) Take 1 tablet by mouth daily.   Yes [provider]  aspirin 81 MG chewable tablet Chew 81 mg by mouth daily.   Yes [provider]  Carboxymethylcellul-Glycerin (CLEAR EYES FOR DRY EYES) 1-0.25 % SOLN Place 1 drop into both eyes daily as needed (dry eyes).   Yes [provider]  hydrochlorothiazide (HYDRODIURIL) 25 MG tablet Take 25 mg by mouth daily.   Yes [provider]  metFORMIN (GLUCOPHAGE) 1000 MG tablet Take 1,000 mg by mouth 2 (two) times daily with a meal.   Yes [provider]  metoprolol (TOPROL-XL) 200 MG 24 hr tablet Take 200 mg by mouth daily.   Yes [provider]  omeprazole (PRILOSEC) 40 MG capsule Take 40 mg by mouth daily.   Yes [provider]  Semaglutide,0.25 or 0.5MG/DOS, (OZEMPIC, 0.25 OR 0.5 MG/DOSE,) 2 MG/1.5ML SOPN Inject 0.5 mg into the skin every Wednesday.   Yes [provider]    Medications:  I have reviewed the patient's current  medications. Scheduled: . amLODipine  5 mg Per Tube Daily  . chlorhexidine gluconate (MEDLINE KIT)  15 mL Mouth Rinse BID  . Chlorhexidine Gluconate Cloth  6 each Topical Daily  . feeding supplement (PROSource TF)  90 mL Per Tube BID  . folic acid  1 mg Per Tube Daily  . hydrochlorothiazide  25 mg Per Tube Daily  . insulin aspart  0-15 Units Subcutaneous Q4H  . insulin aspart  2 Units Subcutaneous Q4H  . mouth rinse  15 mL Mouth Rinse 10 times per day  . multivitamin with minerals  1 tablet Per Tube Daily  . [START ON 07/02/2020] thiamine  100 mg Per Tube Daily   Or  . [START ON 07/02/2020] thiamine  100 mg Intravenous Daily   Continuous: . sodium chloride 75 mL/hr at 07/01/20 1049  . ceFEPime (MAXIPIME) IV Stopped (07/01/20 0551)  . clevidipine 10 mg/hr (07/01/20 1048)  . feeding supplement (OSMOLITE 1.2 CAL) 1,000 mL (07/01/20 0048)  . levETIRAcetam 400 mL/hr at 07/01/20 1000    Results for orders placed or performed during the hospital encounter of 06/29/20 (from the past 48 hour(s))  CBG monitoring, ED     Status: Abnormal   Collection Time: 06/29/20  7:43 PM  Result Value Ref Range   Glucose-Capillary 196 (H) 70 - 99 mg/dL    Comment: Glucose reference range applies only to samples taken after fasting for at least 8 hours.  CBC with Differential  Status: Abnormal   Collection Time: 06/29/20  7:44 PM  Result Value Ref Range   WBC 15.1 (H) 4.0 - 10.5 K/uL   RBC 4.97 4.22 - 5.81 MIL/uL   Hemoglobin 14.3 13.0 - 17.0 g/dL   HCT 44.4 39 - 52 %   MCV 89.3 80.0 - 100.0 fL   MCH 28.8 26.0 - 34.0 pg   MCHC 32.2 30.0 - 36.0 g/dL   RDW 12.9 11.5 - 15.5 %   Platelets 219 150 - 400 K/uL   nRBC 0.0 0.0 - 0.2 %   Neutrophils Relative % 52 %   Neutro Abs 8.1 (H) 1.7 - 7.7 K/uL   Lymphocytes Relative 38 %   Lymphs Abs 5.7 (H) 0.7 - 4.0 K/uL   Monocytes Relative 8 %   Monocytes Absolute 1.2 (H) 0 - 1 K/uL   Eosinophils Relative 1 %   Eosinophils Absolute 0.1 0 - 0 K/uL    Basophils Relative 0 %   Basophils Absolute 0.0 0 - 0 K/uL   Immature Granulocytes 1 %   Abs Immature Granulocytes 0.07 0.00 - 0.07 K/uL    Comment: Performed at [facility], [address] [place], [place] [address]  Basic metabolic panel     Status: Abnormal   Collection Time: 06/29/20  7:44 PM  Result Value Ref Range   Sodium 135 135 - 145 mmol/L   Potassium 3.9 3.5 - 5.1 mmol/L   Chloride 96 (L) 98 - 111 mmol/L   CO2 22 22 - 32 mmol/L   Glucose, Bld 206 (H) 70 - 99 mg/dL    Comment: Glucose reference range applies only to samples taken after fasting for at least 8 hours.   BUN 11 8 - 23 mg/dL   Creatinine, Ser 1.17 0.61 - 1.24 mg/dL   Calcium 9.5 8.9 - 10.3 mg/dL   GFR calc non Af Amer >60 >60 mL/min   GFR calc Af Amer >60 >60 mL/min   Anion gap 17 (H) 5 - 15    Comment: Performed at [facility] [address], [place], [place] [address]  ABO/Rh     Status: None   Collection Time: 06/29/20  7:52 PM  Result Value Ref Range   ABO/RH(D)      AB POS Performed at [facility] [address] [place], [place] [address]   Ethanol     Status: Abnormal   Collection Time: 06/29/20  8:30 PM  Result Value Ref Range   Alcohol, Ethyl (B) 26 (H) <10 mg/dL    Comment: (NOTE) Lowest detectable limit for serum alcohol is 10 mg/dL.  For medical purposes only. Performed at [facility], [place] [address] [place], [place] [address]   SARS Coronavirus 2 by RT PCR (hospital order, performed in [facility] hospital lab) Nasopharyngeal Nasopharyngeal Swab     Status: None   Collection Time: 06/29/20  8:52 PM   Specimen: Nasopharyngeal Swab  Result Value Ref Range   SARS Coronavirus 2 NEGATIVE NEGATIVE    Comment: (NOTE) SARS-CoV-2 target nucleic acids are NOT DETECTED.  The SARS-CoV-2 RNA is generally detectable in upper and lower respiratory specimens during the acute phase of infection. The lowest concentration of SARS-CoV-2 viral copies this assay can detect  is 250 copies / mL. A negative result does not preclude SARS-CoV-2 infection and should not be used as the sole basis for treatment or other patient management decisions.  A negative result may occur with  improper specimen collection / handling, submission of specimen other than nasopharyngeal swab, presence of viral mutation(s) within the areas targeted by this assay, and inadequate number of viral copies (<250 copies / mL). A negative result must be combined with clinical observations, patient history, and epidemiological information.  Fact Sheet for Patients:   StrictlyIdeas.no  Fact Sheet for Healthcare Providers: BankingDealers.co.za  This test is not yet approved or  cleared by the [place] FDA and has been authorized for detection and/or diagnosis of SARS-CoV-2 by FDA under an Emergency Use Authorization (EUA).  This EUA will remain in effect (meaning this test can be used) for the duration of the COVID-19 declaration under Section 564(b)(1) of the Act, 21 U.S.C. section 360bbb-3(b)(1), unless the authorization is terminated or revoked sooner.  Performed at [facility], [place] [address] [place], [place] [address]   Type and screen Ordered by PROVIDER DEFAULT     Status: None   Collection Time: 06/29/20  8:55 PM  Result Value Ref Range   ABO/RH(D) AB POS    Antibody Screen NEG    Sample Expiration      07/02/2020,2359 Performed at [facility], [place] [address] [place], [place] [address]   Prepare Pheresed Platelets     Status: None   Collection Time: 06/29/20 10:00 PM  Result Value Ref Range   Unit Number [id number]    Blood Component Type PLTP1 PSORALEN TREATED    Unit division 00    Status of Unit ISSUED,FINAL    Transfusion Status      OK TO TRANSFUSE Performed at [facility] [address] [place], [place] [address]   MRSA PCR Screening     Status: None   Collection Time: 06/30/20  12:16 AM   Specimen: Nasal Mucosa; Nasopharyngeal  Result Value Ref Range   MRSA by PCR NEGATIVE NEGATIVE    Comment:        The GeneXpert MRSA Assay (FDA approved for NASAL specimens only), is one component of a comprehensive MRSA colonization surveillance program. It is not intended to diagnose MRSA infection nor to guide or monitor treatment for MRSA infections. Performed at [facility], [place] [address], [place], [place] [address]   Protime-INR     Status: None   Collection Time: 06/30/20 12:24 AM  Result Value Ref Range   Prothrombin Time 15.1 11.4 - 15.2 seconds   INR 1.2 0.8 - 1.2    Comment: (NOTE) INR goal varies based on device and disease states. Performed at [facility], [place] [address] [place], [place] [address]   APTT     Status: None   Collection Time: 06/30/20 12:24 AM  Result Value Ref Range   aPTT 29 24 - 36 seconds    Comment: Performed at [facility] [address], [place], [place] [address]  CBC     Status: Abnormal   Collection Time: 06/30/20 12:24 AM  Result Value Ref Range   WBC 18.8 (H) 4.0 - 10.5 K/uL   RBC 4.73 4.22 - 5.81 MIL/uL   Hemoglobin 13.6 13.0 - 17.0 g/dL   HCT 42.1 39 - 52 %   MCV 89.0 80.0 - 100.0 fL   MCH 28.8 26.0 - 34.0 pg   MCHC 32.3 30.0 - 36.0 g/dL   RDW 12.7 11.5 - 15.5 %   Platelets 184 150 - 400 K/uL   nRBC 0.0 0.0 - 0.2 %    Comment: Performed  at [facility], [place] [address], [place], [place] [address]  Basic metabolic panel     Status: Abnormal   Collection Time: 06/30/20 12:24 AM  Result Value Ref Range   Sodium 132 (L) 135 - 145 mmol/L   Potassium 3.6 3.5 - 5.1 mmol/L   Chloride 94 (L) 98 - 111 mmol/L   CO2 16 (L) 22 - 32 mmol/L   Glucose, Bld 326 (H) 70 - 99 mg/dL    Comment: Glucose reference range applies only to samples taken after fasting for at least 8 hours.   BUN 10 8 - 23 mg/dL   Creatinine, Ser 1.03 0.61 - 1.24 mg/dL   Calcium 9.4 8.9 - 10.3 mg/dL   GFR calc non Af Amer >60  >60 mL/min   GFR calc Af Amer >60 >60 mL/min   Anion gap 22 (H) 5 - 15    Comment: Performed at [facility] [address] [place], [place] [address]  Magnesium     Status: Abnormal   Collection Time: 06/30/20 12:24 AM  Result Value Ref Range   Magnesium 1.4 (L) 1.7 - 2.4 mg/dL    Comment: Performed at [facility] [address] [place], [place] [address]  Phosphorus     Status: None   Collection Time: 06/30/20 12:24 AM  Result Value Ref Range   Phosphorus 3.4 2.5 - 4.6 mg/dL    Comment: Performed at [facility] [address] [place], [place] [address]  Hemoglobin A1c     Status: Abnormal   Collection Time: 06/30/20 12:24 AM  Result Value Ref Range   Hgb A1c MFr Bld 8.5 (H) 4.8 - 5.6 %    Comment: (NOTE) Pre diabetes:          5.7%-6.4%  Diabetes:              >6.4%  Glycemic control for   <7.0% adults with diabetes    Mean Plasma Glucose 197.25 mg/dL    Comment: Performed at [facility] [address], [place], [place] [address]  I-STAT 7, (LYTES, BLD GAS, ICA, H+H)     Status: Abnormal   Collection Time: 06/30/20  4:14 AM  Result Value Ref Range   pH, Arterial 7.284 (L) 7.35 - 7.45   pCO2 arterial 23.4 (L) 32 - 48 mmHg   pO2, Arterial 82 (L) 83 - 108 mmHg   Bicarbonate 11.1 (L) 20.0 - 28.0 mmol/L   TCO2 12 (L) 22 - 32 mmol/L   O2 Saturation 95.0 %   Acid-base deficit 14.0 (H) 0.0 - 2.0 mmol/L   Sodium 134 (L) 135 - 145 mmol/L   Potassium 3.8 3.5 - 5.1 mmol/L   Calcium, Ion 1.16 1.15 - 1.40 mmol/L   HCT 42.0 39 - 52 %   Hemoglobin 14.3 13.0 - 17.0 g/dL   Patient temperature 99.1 F    Collection site Radial    Drawn by RT    Sample type ARTERIAL   Glucose, capillary     Status: Abnormal   Collection Time: 06/30/20  9:11 AM  Result Value Ref Range   Glucose-Capillary 368 (H) 70 - 99 mg/dL    Comment: Glucose reference range applies only to samples taken after fasting for at least 8 hours.  Glucose, capillary     Status: Abnormal    Collection Time: 06/30/20 11:09 AM  Result Value Ref Range   Glucose-Capillary 347 (H) 70 - 99 mg/dL  Comment: Glucose reference range applies only to samples taken after fasting for at least 8 hours.  I-STAT 7, (LYTES, BLD GAS, ICA, H+H)     Status: Abnormal   Collection Time: 06/30/20 12:19 PM  Result Value Ref Range   pH, Arterial 7.411 7.35 - 7.45   pCO2 arterial 27.0 (L) 32 - 48 mmHg   pO2, Arterial 72 (L) 83 - 108 mmHg   Bicarbonate 16.8 (L) 20.0 - 28.0 mmol/L   TCO2 18 (L) 22 - 32 mmol/L   O2 Saturation 93.0 %   Acid-base deficit 6.0 (H) 0.0 - 2.0 mmol/L   Sodium 137 135 - 145 mmol/L   Potassium 3.9 3.5 - 5.1 mmol/L   Calcium, Ion 1.18 1.15 - 1.40 mmol/L   HCT 37.0 (L) 39 - 52 %   Hemoglobin 12.6 (L) 13.0 - 17.0 g/dL   Patient temperature 102.0 F    Collection site Radial    Drawn by RT    Sample type ARTERIAL   Lactic acid, plasma     Status: Abnormal   Collection Time: 06/30/20 12:52 PM  Result Value Ref Range   Lactic Acid, Venous 5.9 (HH) 0.5 - 1.9 mmol/L    Comment: CRITICAL RESULT CALLED TO, READ BACK BY AND VERIFIED WITH: K.WALTON,RN 1350 06/30/20 CLARK,S Performed at [facility], [address] [place], [place] [address]   Procalcitonin - Baseline     Status: None   Collection Time: 06/30/20 12:52 PM  Result Value Ref Range   Procalcitonin 1.06 ng/mL    Comment:        Interpretation: PCT > 0.5 ng/mL and <= 2 ng/mL: Systemic infection (sepsis) is possible, but other conditions are known to elevate PCT as well. (NOTE)       Sepsis PCT Algorithm           Lower Respiratory Tract                                      Infection PCT Algorithm    ----------------------------     ----------------------------         PCT < 0.25 ng/mL                PCT < 0.10 ng/mL          Strongly encourage             Strongly discourage   discontinuation of antibiotics    initiation of antibiotics    ----------------------------      -----------------------------       PCT 0.25 - 0.50 ng/mL            PCT 0.10 - 0.25 ng/mL               OR       >80% decrease in PCT            Discourage initiation of                                            antibiotics      Encourage discontinuation           of antibiotics    ----------------------------     -----------------------------         PCT >= 0.50 ng/mL  PCT 0.26 - 0.50 ng/mL                AND       <80% decrease in PCT             Encourage initiation of                                             antibiotics       Encourage continuation           of antibiotics    ----------------------------     -----------------------------        PCT >= 0.50 ng/mL                  PCT > 0.50 ng/mL               AND         increase in PCT                  Strongly encourage                                      initiation of antibiotics    Strongly encourage escalation           of antibiotics                                     -----------------------------                                           PCT <= 0.25 ng/mL                                                 OR                                        > 80% decrease in PCT                                      Discontinue / Do not initiate                                             antibiotics  Performed at [facility], [address] [place], [place] [address]   Glucose, capillary     Status: Abnormal   Collection Time: 06/30/20  3:24 PM  Result Value Ref Range   Glucose-Capillary 235 (H) 70 - 99 mg/dL    Comment: Glucose reference range applies only to samples taken after fasting for at least 8 hours.  Urinalysis, Routine w reflex microscopic     Status: Abnormal   Collection Time: 06/30/20  4:50 PM  Result Value Ref Range   Color, Urine YELLOW YELLOW   APPearance CLEAR CLEAR   Specific Gravity, Urine 1.018 1.005 - 1.030   pH 5.0 5.0 - 8.0   Glucose, UA >=500 (A) NEGATIVE mg/dL   Hgb urine  dipstick SMALL (A) NEGATIVE   Bilirubin Urine NEGATIVE NEGATIVE   Ketones, ur 5 (A) NEGATIVE mg/dL   Protein, ur NEGATIVE NEGATIVE mg/dL   Nitrite NEGATIVE NEGATIVE   Leukocytes,Ua NEGATIVE NEGATIVE   RBC / HPF 0-5 0 - 5 RBC/hpf   WBC, UA 0-5 0 - 5 WBC/hpf   Bacteria, UA NONE SEEN NONE SEEN   Squamous Epithelial / LPF 0-5 0 - 5   Mucus PRESENT    Uric Acid Crys, UA PRESENT     Comment: Performed at [facility] [address], [place], [place] [address]  Strep pneumoniae urinary antigen     Status: None   Collection Time: 06/30/20  4:50 PM  Result Value Ref Range   Strep Pneumo Urinary Antigen NEGATIVE NEGATIVE    Comment:        Infection due to S. pneumoniae cannot be absolutely ruled out since the antigen present may be below the detection limit of the test. Performed at [facility], [address] [place], [place] [address]   Glucose, capillary     Status: Abnormal   Collection Time: 06/30/20  7:26 PM  Result Value Ref Range   Glucose-Capillary 262 (H) 70 - 99 mg/dL    Comment: Glucose reference range applies only to samples taken after fasting for at least 8 hours.  Glucose, capillary     Status: Abnormal   Collection Time: 06/30/20 11:09 PM  Result Value Ref Range   Glucose-Capillary 253 (H) 70 - 99 mg/dL    Comment: Glucose reference range applies only to samples taken after fasting for at least 8 hours.  Glucose, capillary     Status: Abnormal   Collection Time: 07/01/20  3:10 AM  Result Value Ref Range   Glucose-Capillary 270 (H) 70 - 99 mg/dL    Comment: Glucose reference range applies only to samples taken after fasting for at least 8 hours.  CBC     Status: Abnormal   Collection Time: 07/01/20  4:49 AM  Result Value Ref Range   WBC 19.0 (H) 4.0 - 10.5 K/uL   RBC 4.14 (L) 4.22 - 5.81 MIL/uL   Hemoglobin 12.0 (L) 13.0 - 17.0 g/dL   HCT 36.1 (L) 39 - 52 %   MCV 87.2 80.0 - 100.0 fL   MCH 29.0 26.0 - 34.0 pg   MCHC 33.2 30.0 - 36.0 g/dL   RDW  13.2 11.5 - 15.5 %   Platelets 183 150 - 400 K/uL   nRBC 0.0 0.0 - 0.2 %    Comment: Performed at [facility], [place] [address], [place], [place] [address]  Glucose, capillary     Status: Abnormal   Collection Time: 07/01/20  7:44 AM  Result Value Ref Range   Glucose-Capillary 283 (H) 70 - 99 mg/dL    Comment: Glucose reference range applies only to samples taken after fasting for at least 8 hours.    CT HEAD WO CONTRAST  Result Date: 07/01/2020 CLINICAL DATA:  Follow-up subarachnoid hemorrhage; headache, posttraumatic. EXAM: CT HEAD WITHOUT CONTRAST TECHNIQUE: Contiguous axial images were obtained from the base of the skull through the vertex without intravenous contrast. COMPARISON:  Prior head CT examinations 06/30/2020  and earlier FINDINGS: Brain: Extensive acute subarachnoid hemorrhage overlying the bilateral cerebral convexities is similar to minimally increased as compared to the prior head CT of 06/30/2020. Extra-axial hemorrhage along the anterior frontal lobe likely reflects a combination of subarachnoid and subdural hemorrhage and has slightly increased, now measuring 8 mm in greatest thickness (previously 6 mm). There has been continued blooming of extensive hemorrhagic parenchymal contusions within the anteroinferior frontal lobes. Similar appearance of parenchymal contusions within the inferior frontal lobes and left temporal occipital lobes bilaterally. Stable small volume intraventricular hemorrhage. There is no midline shift or evidence of hydrocephalus. No cortically based infarct is definitively identified. Redemonstrated background chronic white matter disease and chronic left caudate nucleus lacunar infarct. Vascular: No hyperdense vessel Skull: Known left calvarial and skull base fractures. Sinuses/Orbits: Visualized orbits show no acute finding. Unchanged fluid within the sphenoid sinuses, likely at least partially reflecting blood products. Mild left maxillary sinus mucosal  thickening. Extensive opacification of the left middle ear cavity and left external auditory canal with a dressing overlying the left external ear. Partial opacification of left mastoid air cells. Other: Redemonstrated left posterior scalp hematoma. IMPRESSION: 1. Continued interval blooming of hemorrhagic contusions within the anteroinferior frontal lobes bilaterally. Similar appearance of hemorrhagic contusions within the inferior temporal lobes and left temporal occipital lobes. 2. Extensive acute subarachnoid hemorrhage overlying the cerebral convexities, similar to minimally increased. 3. Extra-axial hemorrhage along the anterior frontal lobes bilaterally, likely reflecting a combination of subarachnoid and subdural hemorrhage, slightly increased (now measuring 8 mm in greatest thickness). 4. Stable, small volume intraventricular hemorrhage. No hydrocephalus. 5. Known left calvarial and skull base fractures. 6. As before, there is extensive opacification of the left middle ear cavity and left external auditory canal, likely reflecting the presence of blood products. 7. Redemonstrated left posterior scalp hematoma. Electronically Signed   By: Kellie Simmering DO   On: 07/01/2020 09:05   CT HEAD WO CONTRAST  Result Date: 06/30/2020 CLINICAL DATA:  Follow-up subarachnoid hemorrhage EXAM: CT HEAD WITHOUT CONTRAST TECHNIQUE: Contiguous axial images were obtained from the base of the skull through the vertex without intravenous contrast. COMPARISON:  Yesterday FINDINGS: Brain: Generalized increase in subarachnoid hemorrhage along the cerebral convexities. Small volume intraventricular hemorrhage which is likely reflux. No hydrocephalus. Blooming of hemorrhagic contusions in the left more than right inferior frontal lobes. Smaller area of hemorrhagic contusion at the left temporal occipital convexity deep to a skull fracture. Newly apparent contusion along the inferior temporal lobes on both sides. Extra-axial  hemorrhage along the frontal poles is likely some combination of subarachnoid and subdural. The thickness on the left is greatest at 6 mm, similar to prior. Chronic white matter disease. No midline shift or complicating infarct. Chronic lacune at the left caudate head. Vascular: Negative Skull: Left calvarial and skull base fracturing as noted previously. Sinuses/Orbits: Similar degree of left sphenoid hemosinus. IMPRESSION: 1. Blooming contusions in the bilateral inferior frontal, bilateral inferior temporal, and left parieto-occipital lobes. 2. Generalized increase in subarachnoid hemorrhage. There is mild intraventricular reflux without hydrocephalus. 3. Progressive scalp swelling over the left calvarial fracture. Pneumocephalus is no longer seen. Electronically Signed   By: Monte Fantasia M.D.   On: 06/30/2020 06:48   CT Head Wo Contrast  Result Date: 06/29/2020 CLINICAL DATA:  74 year old male with head trauma. EXAM: CT HEAD WITHOUT CONTRAST CT CERVICAL SPINE WITHOUT CONTRAST TECHNIQUE: Multidetector CT imaging of the head and cervical spine was performed following the standard protocol without intravenous contrast. Multiplanar CT image reconstructions  of the cervical spine were also generated. COMPARISON:  Head CT dated 06/14/2014. FINDINGS: Evaluation of this exam is limited due to motion artifact. CT HEAD FINDINGS Brain: There is moderate age-related atrophy and chronic microvascular ischemic changes. Bilateral frontoparietal subdural hemorrhages measuring up to 7 mm in thickness over the frontal lobes. There is extension of the subdural blood along the anterior falx. Left parietal subdural hemorrhage measures 6 mm in thickness. There is mild mass effect associated with this subdural hemorrhage on the adjacent brain parenchyma. Bifrontal subarachnoid hemorrhages as well as subarachnoid hemorrhage in the right parietal lobe. Diffuse left temporal subarachnoid hemorrhage. There is a focal area of  hemorrhage measuring approximately 2.3 x 1.1 cm over the left posterior temporal lobe (17/1) which may represent combination of subarachnoid hemorrhage and intraparenchymal hemorrhage. Several small pockets of air associated with this hemorrhage consistent with overlying calvarial fracture. No midline shift. Vascular: No hyperdense vessel or unexpected calcification. Skull: There is a nondisplaced fracture of the left parietal calvarium along the lambdoid suture extending into the left occipital calvarium. There is fracture of the temporal bone and mastoid air cells with extension of the fracture into the left external auditory canal. Dedicated temporal bone CT is recommended for further evaluation. Sinuses/Orbits: There is partial opacification of the left sphenoid sinus with air-fluid level and high attenuating content. Minimal left mastoid effusion. Other: There is left parietal scalp hematoma along the skull fracture. CT CERVICAL SPINE FINDINGS Alignment: No acute subluxation. There is straightening of normal cervical lordosis which may be positional or due to muscle spasm. Skull base and vertebrae: No acute fracture. Soft tissues and spinal canal: No prevertebral fluid or swelling. No visible canal hematoma. Disc levels:  Multilevel degenerative changes with spurring. Upper chest: Negative. Other: Bilateral carotid bulb calcified plaques. IMPRESSION: 1. Nondisplaced fracture of the left parietal calvarium along the left lambdoid suture extending into the left occipital calvarium and left temporal bone with involvement of the left mastoid air cell and external auditory canal. Dedicated temporal bone CT is recommended for further evaluation. 2. Bilateral subdural and subarachnoid hemorrhages. No midline shift. 3. Focal hemorrhage with small pockets of pneumocephalus in the left posterior temporal lobe along the calvarial fracture. This likely represents combination of subarachnoid and intraparenchymal hemorrhage.  4. No acute/traumatic cervical spine pathology. These results were called by telephone at the time of interpretation on 06/29/2020 at 8:25 pm to provider DAN FLOYD , who verbally acknowledged these results. Electronically Signed   By: Anner Crete M.D.   On: 06/29/2020 20:28   CT Cervical Spine Wo Contrast  Result Date: 06/29/2020 CLINICAL DATA:  74 year old male with head trauma. EXAM: CT HEAD WITHOUT CONTRAST CT CERVICAL SPINE WITHOUT CONTRAST TECHNIQUE: Multidetector CT imaging of the head and cervical spine was performed following the standard protocol without intravenous contrast. Multiplanar CT image reconstructions of the cervical spine were also generated. COMPARISON:  Head CT dated 06/14/2014. FINDINGS: Evaluation of this exam is limited due to motion artifact. CT HEAD FINDINGS Brain: There is moderate age-related atrophy and chronic microvascular ischemic changes. Bilateral frontoparietal subdural hemorrhages measuring up to 7 mm in thickness over the frontal lobes. There is extension of the subdural blood along the anterior falx. Left parietal subdural hemorrhage measures 6 mm in thickness. There is mild mass effect associated with this subdural hemorrhage on the adjacent brain parenchyma. Bifrontal subarachnoid hemorrhages as well as subarachnoid hemorrhage in the right parietal lobe. Diffuse left temporal subarachnoid hemorrhage. There is a focal area of hemorrhage  measuring approximately 2.3 x 1.1 cm over the left posterior temporal lobe (17/1) which may represent combination of subarachnoid hemorrhage and intraparenchymal hemorrhage. Several small pockets of air associated with this hemorrhage consistent with overlying calvarial fracture. No midline shift. Vascular: No hyperdense vessel or unexpected calcification. Skull: There is a nondisplaced fracture of the left parietal calvarium along the lambdoid suture extending into the left occipital calvarium. There is fracture of the temporal bone  and mastoid air cells with extension of the fracture into the left external auditory canal. Dedicated temporal bone CT is recommended for further evaluation. Sinuses/Orbits: There is partial opacification of the left sphenoid sinus with air-fluid level and high attenuating content. Minimal left mastoid effusion. Other: There is left parietal scalp hematoma along the skull fracture. CT CERVICAL SPINE FINDINGS Alignment: No acute subluxation. There is straightening of normal cervical lordosis which may be positional or due to muscle spasm. Skull base and vertebrae: No acute fracture. Soft tissues and spinal canal: No prevertebral fluid or swelling. No visible canal hematoma. Disc levels:  Multilevel degenerative changes with spurring. Upper chest: Negative. Other: Bilateral carotid bulb calcified plaques. IMPRESSION: 1. Nondisplaced fracture of the left parietal calvarium along the left lambdoid suture extending into the left occipital calvarium and left temporal bone with involvement of the left mastoid air cell and external auditory canal. Dedicated temporal bone CT is recommended for further evaluation. 2. Bilateral subdural and subarachnoid hemorrhages. No midline shift. 3. Focal hemorrhage with small pockets of pneumocephalus in the left posterior temporal lobe along the calvarial fracture. This likely represents combination of subarachnoid and intraparenchymal hemorrhage. 4. No acute/traumatic cervical spine pathology. These results were called by telephone at the time of interpretation on 06/29/2020 at 8:25 pm to provider DAN FLOYD , who verbally acknowledged these results. Electronically Signed   By: Anner Crete M.D.   On: 06/29/2020 20:28   DG CHEST PORT 1 VIEW  Result Date: 06/30/2020 CLINICAL DATA:  Tachypnea EXAM: PORTABLE CHEST 1 VIEW COMPARISON:  02/24/2011 FINDINGS: Normal heart size and mediastinal contours. No acute infiltrate or edema. No effusion or pneumothorax. No acute osseous findings.  IMPRESSION: No active disease. Electronically Signed   By: Monte Fantasia M.D.   On: 06/30/2020 04:35   DG Abd Portable 1V  Result Date: 06/30/2020 CLINICAL DATA:  Feeding tube placement. EXAM: PORTABLE ABDOMEN - 1 VIEW COMPARISON:  None. FINDINGS: The bowel gas pattern is normal. Distal tip of feeding tube is seen in proximal stomach. No radio-opaque calculi or other significant radiographic abnormality are seen. IMPRESSION: Distal tip of feeding tube seen in proximal stomach. Electronically Signed   By: Marijo Conception M.D.   On: 06/30/2020 13:03   EEG adult  Result Date: 06/30/2020 Lora Havens, MD     06/30/2020 10:43 AM Patient Name: Zakk Borgen MRN: [id number] Epilepsy Attending: Lora Havens Referring Physician/Provider: Montey Hora, PA Date: 06/30/2020 Duration: 24.28 mins Patient history: 74yo M with SAH and seizure like activity. EEG to evaluate for seizure. Level of alertness: Awake/ lethargic AEDs during EEG study: LEV, ativan Technical aspects: This EEG study was done with scalp electrodes positioned according to the 10-20 International system of electrode placement. Electrical activity was acquired at a sampling rate of _0  and reviewed with a high frequency filter of _1  and a low frequency filter of _2 . EEG data were recorded continuously and digitally stored. Description: EEG showed continuous generalized 3 to 6 Hz theta-delta slowing admixed with 15 to 18 Hz, 2-3 uV beta  activity with irregular morphology distributed symmetrically and diffusely.  Hyperventilation and photic stimulation were not performed.   ABNORMALITY -Continuous slow, generalized -Excessive beta, generalized IMPRESSION: This study is suggestive of moderate diffuse encephalopathy, nonspecific etiology.The excessive beta activity seen in the background is most likely due to the effect of benzodiazepine and is a benign EEG pattern. No seizures or epileptiform discharges were seen throughout the recording.  Priyanka Barbra Sarks   CT TEMPORAL BONES WO CONTRAST  Result Date: 06/30/2020 CLINICAL DATA:  Left temporal bone fracture EXAM: CT TEMPORAL BONES WITHOUT CONTRAST TECHNIQUE: Axial and coronal plane CT imaging of the petrous temporal bones was performed with thin-collimation image reconstruction. No intravenous contrast was administered. Multiplanar CT image reconstructions were also generated. COMPARISON:  None. FINDINGS: Known left-sided temporal bone fracture with primarily longitudinal orientation. There is widening at the malleoincudal joint without out-right dislocation. The stapes appears located. No pneumo labyrinth or otic capsule involvement. There is extension inferiorly into the left TMJ. Hemotympanum and hemo mastoid. Soft tissue gas below the temporal bone and at the left tegmen. There is continuation of the fracture along the left lambdoid suture which is diastatic. Negative right temporal bone. Bilateral sphenoid sinusitis which may be chronic as there is no visible traversing fracture. Intracranial findings as described on dedicated head CT. IMPRESSION: Longitudinal left temporal bone fracture with mild diastasis of the malleoincudal joint. No otic capsule involvement. Hemotympanum on the left with pneumocephalus and soft tissue emphysema. Electronically Signed   By: Monte Fantasia M.D.   On: 06/30/2020 07:07   Review of Systems  Unable to perform ROS: Mental status change   Blood pressure 134/69, pulse (!) 104, temperature 99.6 F (37.6 C), temperature source Axillary, resp. rate (!) 28, height _0  (1.854 m), weight 73.7 kg, SpO2 97 %. Physical Exam: General appearance: Asleep. Not responsive. Eyes: Pupils are equal, round, reactive to light. Extraocular motion cannot be determined. Ears: Examination of the ears shows normal auricles and external auditory canal on the right. Bleeding from the left ear canal. Nose: Nasal examination shows normal mucosa, septum, turbinates.  Face:  Facial examination shows no asymmetry. No laceration. Mouth: Normal lips. Unable to exam the oral cavity. Neck: Palpation of the neck reveals no lymphadenopathy or mass. The trachea is midline.   Assessment/Plan:  Left longitudinal temporal bone fracture with possible separation of the malleus and incus. - Cotton ball packing of the left ear canal. Change cotton ball as needed. - The T-bone CT reviewed. No obvious otic capsule involvement.  - No acute ENT intervention needed at this time. - The patient will need hearing test as an outpatient in my office. May need middle ear exploration if significant conductive hearing loss is noted.  Shrita Thien W Howard Patton 07/01/2020, 10:52 AM

## 2020-07-01 NOTE — Progress Notes (Signed)
eLink Physician-Brief Progress Note Patient Name: Harold Mckee DOB: October 22, 1946 MRN: [id number]   Date of Service  07/01/2020  HPI/Events of Note  Patient with extensive subarachnoid hemorrhage with decline in mental status to extreme somnolence and questionable airway protection, he's also been desaturating.  eICU Interventions  Bedside crew requested to come and intubate patient following which he will need a stat head CT to r/o increasing ICP. Also ABG post intubation.        Migdalia Dk 07/01/2020, 7:37 PM

## 2020-07-02 ENCOUNTER — Inpatient Hospital Stay (HOSPITAL_COMMUNITY): Payer: Medicare HMO

## 2020-07-02 DIAGNOSIS — J9600 Acute respiratory failure, unspecified whether with hypoxia or hypercapnia: Secondary | ICD-10-CM

## 2020-07-02 LAB — COMPREHENSIVE METABOLIC PANEL
ALT: 19 U/L (ref 0–44)
AST: 21 U/L (ref 15–41)
Albumin: 2.8 g/dL — ABNORMAL LOW (ref 3.5–5.0)
Alkaline Phosphatase: 30 U/L — ABNORMAL LOW (ref 38–126)
Anion gap: 14 (ref 5–15)
BUN: 20 mg/dL (ref 8–23)
CO2: 19 mmol/L — ABNORMAL LOW (ref 22–32)
Calcium: 8.9 mg/dL (ref 8.9–10.3)
Chloride: 100 mmol/L (ref 98–111)
Creatinine, Ser: 1.01 mg/dL (ref 0.61–1.24)
GFR calc Af Amer: >60 mL/min (ref >60)
GFR calc non Af Amer: >60 mL/min (ref >60)
Glucose, Bld: 270 mg/dL — ABNORMAL HIGH (ref 70–99)
Potassium: 3.5 mmol/L (ref 3.5–5.1)
Sodium: 133 mmol/L — ABNORMAL LOW (ref 135–145)
Total Bilirubin: 0.8 mg/dL (ref 0.3–1.2)
Total Protein: 6.4 g/dL — ABNORMAL LOW (ref 6.5–8.1)

## 2020-07-02 LAB — SODIUM
Sodium: 134 mmol/L — ABNORMAL LOW (ref 135–145)
Sodium: 135 mmol/L (ref 135–145)

## 2020-07-02 LAB — GLUCOSE, CAPILLARY
Glucose-Capillary: 279 mg/dL — ABNORMAL HIGH (ref 70–99)
Glucose-Capillary: 283 mg/dL — ABNORMAL HIGH (ref 70–99)
Glucose-Capillary: 245 mg/dL — ABNORMAL HIGH (ref 70–99)
Glucose-Capillary: 312 mg/dL — ABNORMAL HIGH (ref 70–99)
Glucose-Capillary: 304 mg/dL — ABNORMAL HIGH (ref 70–99)
Glucose-Capillary: 267 mg/dL — ABNORMAL HIGH (ref 70–99)

## 2020-07-02 LAB — CBC
HCT: 32.9 % — ABNORMAL LOW (ref 39.0–52.0)
Hemoglobin: 11.2 g/dL — ABNORMAL LOW (ref 13.0–17.0)
MCH: 28.7 pg (ref 26.0–34.0)
MCHC: 34 g/dL (ref 30.0–36.0)
MCV: 84.4 fL (ref 80.0–100.0)
Platelets: 119 10*3/uL — ABNORMAL LOW (ref 150–400)
RBC: 3.9 MIL/uL — ABNORMAL LOW (ref 4.22–5.81)
RDW: 12.8 % (ref 11.5–15.5)
WBC: 8.9 10*3/uL (ref 4.0–10.5)
nRBC: 0 % (ref 0.0–0.2)

## 2020-07-02 LAB — PHOSPHORUS: Phosphorus: 2 mg/dL — ABNORMAL LOW (ref 2.5–4.6)

## 2020-07-02 LAB — MAGNESIUM: Magnesium: 1.8 mg/dL (ref 1.7–2.4)

## 2020-07-02 LAB — PROCALCITONIN: Procalcitonin: 6.07 ng/mL

## 2020-07-02 LAB — LEGIONELLA PNEUMOPHILA SEROGP 1 UR AG: L. pneumophila Serogp 1 Ur Ag: NEGATIVE

## 2020-07-02 MED ORDER — INSULIN ASPART 100 UNIT/ML ~~LOC~~ SOLN
3.0000 [IU] | SUBCUTANEOUS | Status: DC
  Administered 2020-07-02 – 2020-07-03 (×6): 3 [IU] via SUBCUTANEOUS

## 2020-07-02 MED ORDER — INSULIN DETEMIR 100 UNIT/ML ~~LOC~~ SOLN
8.0000 [IU] | Freq: Two times a day (BID) | SUBCUTANEOUS | Status: DC
  Administered 2020-07-02 – 2020-07-03 (×3): 8 [IU] via SUBCUTANEOUS
  Filled 2020-07-02 (×4): qty 0.08

## 2020-07-02 MED ORDER — MAGNESIUM SULFATE 2 GM/50ML IV SOLN
2.0000 g | Freq: Once | INTRAVENOUS | Status: AC
  Administered 2020-07-02: 2 g via INTRAVENOUS
  Filled 2020-07-02: qty 50

## 2020-07-02 MED ORDER — POTASSIUM CHLORIDE 20 MEQ/15ML (10%) PO SOLN
40.0000 meq | Freq: Once | ORAL | Status: AC
  Administered 2020-07-02: 40 meq
  Filled 2020-07-02: qty 30

## 2020-07-02 MED ORDER — SODIUM CHLORIDE 3 % IV SOLN
INTRAVENOUS | Status: AC
  Filled 2020-07-02 (×4): qty 500

## 2020-07-02 MED ORDER — POTASSIUM PHOSPHATES 15 MMOLE/5ML IV SOLN
10.0000 mmol | Freq: Once | INTRAVENOUS | Status: AC
  Administered 2020-07-02: 10 mmol via INTRAVENOUS
  Filled 2020-07-02: qty 3.33

## 2020-07-02 MED ORDER — VANCOMYCIN HCL 1500 MG/300ML IV SOLN
1500.0000 mg | Freq: Once | INTRAVENOUS | Status: AC
  Administered 2020-07-02: 1500 mg via INTRAVENOUS
  Filled 2020-07-02: qty 300

## 2020-07-02 MED ORDER — VANCOMYCIN HCL 750 MG/150ML IV SOLN
750.0000 mg | Freq: Two times a day (BID) | INTRAVENOUS | Status: AC
  Administered 2020-07-02 – 2020-07-06 (×9): 750 mg via INTRAVENOUS
  Filled 2020-07-02 (×9): qty 150

## 2020-07-02 NOTE — Progress Notes (Signed)
Subjective: Intubated, sedated, no issues per RN. Intubated overnight for lethargy  Objective: PE: Intubated, sedated Eyes closed to pain Midline gaze Pupils equally round reactive to light Face symmetric, winces to pain Localizing strongly BUE, WD to pain in LEs Dry dressing over L ear   Assessment/Plan: 1. Left occipital parietotemporal calvarial fracture secondary to fall 2.Left temporal contusion 3.Bilateral frontal traumatic subarachnoid hemorrhage & contusions/subdural hematomas 4. TBI 5. Hyponatremia   -neuro ICU -Every hour neurochecks -Keppra 1000 mg twice daily (EEG neg) -N.p.o., coretrak feeds -Blood pressure control -HOLD ALL ANTICOAG -No acute surgical intervention at this time -CT brain repeated once intubated, shows continued evolution of SAH/contusions with local mass effect on BL frontal lobes and some increased edema in the surrounding frontal lobes, basal cisterns patent -starting hypertonic saline 3% @ 25, mon Na, slightly low at this time (133) -cont to monitor closely   Monia Pouch, DO Neurosurgeon [facility] Cell: [phone] 07/02/2020 0900

## 2020-07-02 NOTE — Progress Notes (Signed)
PT Cancellation Note  Patient Details Name: Harold Mckee MRN: [id number] DOB: [birth year]   Cancelled Treatment:    Reason Eval/Treat Not Completed: Medical issues which prohibited therapy; patient re-intubated and not yet ready for PT. Will attempt again another day.   [name] 07/02/2020, 11:47 AM  Sheran Lawless, PT Acute Rehabilitation Services [phone] [phone] 07/02/2020

## 2020-07-02 NOTE — Progress Notes (Signed)
Nutrition Follow-up  DOCUMENTATION CODES:   Not applicable  INTERVENTION:   Tube feeding via Cortrak tube; tip gastric: Osmolite 1.2 at 65 ml/h (1560 ml per day) Prosource TF 90 ml BID  Provides 2032 kcal, 130 gm protein, 1265 ml free water daily   NUTRITION DIAGNOSIS:   Inadequate oral intake related to inability to eat as evidenced by NPO status. Ongoing.   GOAL:   Patient will meet greater than or equal to 90% of their needs Met with EN  MONITOR:   TF tolerance, Diet advancement  REASON FOR ASSESSMENT:   Consult Enteral/tube feeding initiation and management  ASSESSMENT:   Pt with PMH of HTN and DM admitted after fall while walking his dog with L skull fx and bilateral SAH/SDH.   Pt discussed during ICU rounds and with RN.  ETOH 26 on admission, per MD unsure if use is acute or chronic.  Pt intubated overnight + fevers  7/14 cortrak placement; tip gastric   Patient is currently intubated on ventilator support MV: 11.8 L/min Temp (24hrs), Avg:100.7 F (38.2 C), Min:100 F (37.8 C), Max:102.3 F (39.1 C)   Medications reviewed and include: colace, folic acid, SSI, 3 units novolog every 4 hours, 8 units levemir BID, MVI, miralax, thiamine  KPhos x 1 IV Labs reviewed: PO4: 2 CBG's: 283-245   Diet Order:   Diet Order            Diet NPO time specified  Diet effective now                 EDUCATION NEEDS:   No education needs have been identified at this time  Skin:  Skin Assessment: Reviewed RN Assessment  Last BM:  unknown  Height:   Ht Readings from Last 1 Encounters:  06/30/20 _0  (1.854 m)    Weight:   Wt Readings from Last 1 Encounters:  07/02/20 71 kg    Ideal Body Weight:  83.6 kg  BMI:  Body mass index is 20.65 kg/m.  Estimated Nutritional Needs:   Kcal:  2100  Protein:  120-130 grams  Fluid:  2 L/day  Lockie Pares., RD, LDN, CNSC See AMiON for contact information

## 2020-07-02 NOTE — TOC CAGE-AID Note (Signed)
Transition of Care [facility]) - CAGE-AID Screening   Patient Details  Name: Harold Mckee MRN: [id number] Date of Birth: 01/06/1946  Transition of Care [facility] CM/SW Contact:    Jimmy Picket, [place] Phone Number: 07/02/2020, 4:35 PM   Clinical Narrative:  Pt was unable to participate in assessment.   CAGE-AID Screening: Substance Abuse Screening unable to be completed due to: : Patient unable to participate               Isabella Stalling Clinical Social Worker [phone]

## 2020-07-02 NOTE — Progress Notes (Signed)
Inpatient Diabetes Program Recommendations  AACE/ADA: New Consensus Statement on Inpatient Glycemic Control (2015)  Target Ranges:  Prepandial:   less than 140 mg/dL      Peak postprandial:   less than 180 mg/dL (1-2 hours)      Critically ill patients:  140 - 180 mg/dL   Lab Results  Component Value Date   GLUCAP 283 (H) 07/02/2020   HGBA1C 8.5 (H) 06/30/2020    Review of Glycemic Control Results for Harold Mckee, Harold Mckee (MRN [id number]) as of 07/02/2020 11:02  Ref. Range 07/01/2020 19:18 07/01/2020 23:10 07/02/2020 03:04 07/02/2020 07:34  Glucose-Capillary Latest Ref Range: 70 - 99 mg/dL 829 (H) 562 (H) 130 (H) 283 (H)   Diabetes history: Type 2 DM Outpatient Diabetes medications: Ozempic 0.5 mg qwk, Metformin 1000 mg BID Current orders for Inpatient glycemic control: Novolog 0-15 units Q4H, Novolog 2 units Q4H Osmolite 65 ml/hr  Inpatient Diabetes Program Recommendations:    Consider adding Levemir 8 units BID and increase tube feed coverage to Novolog 4 units Q4H (to stop if tube feeds are held).  Thanks, Lujean Rave, MSN, RNC-OB Diabetes Coordinator [phone] (8a-5p)

## 2020-07-02 NOTE — Progress Notes (Signed)
Pharmacy Antibiotic Note  Harold Mckee is a 74 y.o. male admitted on 06/29/2020. Pharmacy has been consulted for vancomycin dosing for sepsis. Pt previously started on cefepime. Pt is febrile with Tmax 102.3 and WBC is WNL. SCr is WNL at stable at 1.01. PCT and lactic acid are elevated.   Plan: Vancomycin 1500mg  IV x 1 then 750mg  IV Q12H F/u renal fxn, C&S, clinical status and trough at SS  Height: 6\' 1"  (185.4 cm) Weight: 71 kg (156 lb 8.4 oz) IBW/kg (Calculated) : 79.9  Temp (24hrs), Avg:100.9 F (38.3 C), Min:100.1 F (37.8 C), Max:102.3 F (39.1 C)  Recent Labs  Lab 06/29/20 1944 06/30/20 0024 06/30/20 1252 07/01/20 0449 07/01/20 2221 07/02/20 0448  WBC 15.1* 18.8*  --  19.0*  --  8.9  CREATININE 1.17 1.03  --   --   --  1.01  LATICACIDVEN  --   --  5.9*  --  3.8*  --     Estimated Creatinine Clearance: 63.5 mL/min (by C-G formula based on SCr of 1.01 mg/dL).    No Known Allergies  Antimicrobials this admission: Vanc 7/16>> Cefepime 7/ 14 >>   Dose adjustments this admission: N/A  Microbiology results: 7/14 bcx: NGTD  Thank you for allowing pharmacy to be a part of this patient's care.  Barbee Mamula, 2222 07/02/2020 9:06 AM

## 2020-07-02 NOTE — Progress Notes (Signed)
SLP Cancellation Note  Patient Details Name: Lc Joynt MRN: [id number] DOB: 1946-05-27   Cancelled treatment:       Reason Eval/Treat Not Completed: Medical issues which prohibited therapy. Pt has been minimally responsive, now intubated. SLP to sign off at this time. Please reorder when ready.   Mahala Menghini., M.A. CCC-SLP Acute Rehabilitation Services Pager [phone] Office [phone]  07/02/2020, 8:05 AM

## 2020-07-02 NOTE — Progress Notes (Signed)
Subjective: Intubated and sedated. Not responsive.  Objective: Vital signs in last 24 hours: Temp:  [100.1 F (37.8 C)-102.3 F (39.1 C)] 100.1 F (37.8 C) (07/16 0800) Pulse Rate:  [91-134] 95 (07/16 0900) Resp:  [15-45] 23 (07/16 0900) BP: (79-154)/(58-105) 100/67 (07/16 0900) SpO2:  [90 %-100 %] 100 % (07/16 0900) FiO2 (%):  [50 %-100 %] 50 % (07/16 0803) Weight:  [71 kg] 71 kg (07/16 0500)  Physical Exam: General appearance: Intubated. Not responsive. Ears: Examination of the ears shows normal auricles and external auditory canal on the right. A small amount of bleeding from the left ear canal. Nose: Nasal examination shows normal mucosa, septum, turbinates.  Face: Facial examination shows no asymmetry. No laceration. Mouth: Normal lips. Unable to exam the oral cavity. Neck: Palpation of the neck reveals no lymphadenopathy or mass. The trachea is midline.   Recent Labs    07/01/20 0449 07/02/20 0448  WBC 19.0* 8.9  HGB 12.0* 11.2*  HCT 36.1* 32.9*  PLT 183 119*   Recent Labs    06/30/20 0024 06/30/20 0414 06/30/20 1219 07/02/20 0448  NA 132*   < > 137 133*  K 3.6   < > 3.9 3.5  CL 94*  --   --  100  CO2 16*  --   --  19*  GLUCOSE 326*  --   --  270*  BUN 10  --   --  20  CREATININE 1.03  --   --  1.01  CALCIUM 9.4  --   --  8.9   < > = values in this interval not displayed.    Medications:  I have reviewed the patient's current medications. Scheduled: . amLODipine  5 mg Per Tube Daily  . chlorhexidine gluconate (MEDLINE KIT)  15 mL Mouth Rinse BID  . Chlorhexidine Gluconate Cloth  6 each Topical Daily  . docusate  100 mg Per Tube BID  . feeding supplement (PROSource TF)  90 mL Per Tube BID  . folic acid  1 mg Per Tube Daily  . hydrochlorothiazide  25 mg Per Tube Daily  . insulin aspart  0-15 Units Subcutaneous Q4H  . insulin aspart  2 Units Subcutaneous Q4H  . mouth rinse  15 mL Mouth Rinse 10 times per day  . multivitamin with minerals  1 tablet Per  Tube Daily  . pantoprazole (PROTONIX) IV  40 mg Intravenous QHS  . polyethylene glycol  17 g Per Tube Daily  . thiamine  100 mg Per Tube Daily   Or  . thiamine  100 mg Intravenous Daily   Continuous: . sodium chloride 75 mL/hr at 07/02/20 0900  . ceFEPime (MAXIPIME) IV Stopped (07/02/20 0546)  . clevidipine Stopped (07/01/20 1138)  . dexmedetomidine (PRECEDEX) IV infusion 0.2 mcg/kg/hr (07/02/20 0900)  . feeding supplement (OSMOLITE 1.2 CAL) 1,000 mL (07/02/20 0026)  . levETIRAcetam 1,000 mg (07/02/20 0933)  . magnesium sulfate bolus IVPB 2 g (07/02/20 0949)  . potassium PHOSPHATE IVPB (in mmol)    . sodium chloride (hypertonic) 25 mL/hr at 07/02/20 0940  . vancomycin    . vancomycin      Assessment/Plan:  Left longitudinal temporal bone fracture with possible separation of the malleus and incus. - The left bleeding has decreased. Cotton ball packing of the left ear canal as needed.  - No surgical intervention at this time. The patient will need hearing test as an outpatient in my office. May need middle ear exploration if significant conductive hearing loss  is noted.   LOS: 3 days   Doy Taaffe W Jakel Alphin 07/02/2020, 10:38 AM

## 2020-07-02 NOTE — Progress Notes (Signed)
NAME:  Harold Mckee, MRN:  [id number], DOB:  1946-11-25, LOS: 3 ADMISSION DATE:  06/29/2020, CONSULTATION DATE:  06/29/2020 REFERRING MD:  Dr. Jake Samples, CHIEF COMPLAINT:  Bilateral subarachnoid/subdural hematomas   Brief History    Harold Mckee is a 74yo make with PMX significant for HTN and Diabetes who presented after ground level fall resulting in head injury while walking his dog in the park. Per EMS patient was seen drinking alcohol prior to fall.   On arrival patient was seen bleeding from his left ear. Vitals on arrival significant for mild tachypnea and hypertension. Labwork significant for hyperglycemia and mild leukocytosis. CT C-spin and head positive for  left scull fracture and bilateral subarachnoid/subdural hematomas. PCCM consulted for assistance in admission.   Past Medical History  HTN  Diabetes   Significant Hospital Events   Admitted 7/13  7.15  - Started on empiric cefepime 7/14 due to fever and leukocytosis.  PCT 1.06 and lactate 5.9. Remains on cleviprex at 20. Mental status about the same, minimally responsive. CT head read pending.   Consults:  Neurosurgery   ENT 7/15 - Dr Suszanne Conners - >  Left longitudinal temporal bone fracture with possible separation of the malleus and incus. - Cotton ball packing of the left ear canal. Change cotton ball as needed.  The T-bone CT reviewed. No obvious otic capsule involvement.   No acute ENT intervention needed at this time. - The patient will need hearing test as an outpatient in my office. My need middle ear exploration if significant conductive hearing loss is noted.   Procedures:    Significant Diagnostic Tests:  CT C-spine and head 7/13 > 1. Nondisplaced fracture of the left parietal calvarium along the left lambdoid suture extending into the left occipital calvarium and left temporal bone with involvement of the left mastoid air cell and external auditory canal. Dedicated temporal bone CT is recommended for further  evaluation. 2. Bilateral subdural and subarachnoid hemorrhages. No midline shift. 3. Focal hemorrhage with small pockets of pneumocephalus in the left posterior temporal lobe along the calvarial fracture. This likely represents combination of subarachnoid and intraparenchymal hemorrhage. 4. No acute/traumatic cervical spine pathology. CT head 7/14 > blooming contusions in bilateral inferior frontal, bilateral, inferior temporal, and left parieto-occipital lobes.  Generalized increased in SAH, mild IVH without hydrocephalus.  Progressive scalp swelling over left calvarial fracture.  No pneumocephalus.  CT temporal bones 7/14 > longitudinal left temporal bone fx.  Hemotympanum  On left with pneuocephalus and soft tissue emphysema. EEG 7/14 > mod diffuse encephalopathy.  No seizures or epileptiform activity noted.  Micro Data:  COVID 7/13 > negative  Blood 7/13 >   Antimicrobials:   Cefepime 7/13 >   Interim history/subjective:   7/16 - intubated yesterday emergently worsening mental status. Doing PSV but still unresponsive though on precedex gtt. CT head last night appears stable bleed but also -> Hemorrhage in the left external auditory canal contiguous with what appears to be a severe injury of the left pinna with soft tissue swelling. -> ENT consult called  Running fever despite abx. WBC going down  Objective   Blood pressure 98/67, pulse (!) 103, temperature (!) 100.6 F (38.1 C), temperature source Oral, resp. rate (!) 21, height 6\' 1"  (1.854 m), weight 71 kg, SpO2 100 %.    Vent Mode: PRVC FiO2 (%):  [60 %-100 %] 60 % Set Rate:  [16 bmp] 16 bmp Vt Set:  [620 mL] 620 mL PEEP:  [5 cmH20] 5 cmH20  Plateau Pressure:  [17 cmH20-19 cmH20] 17 cmH20   Intake/Output Summary (Last 24 hours) at 07/02/2020 1950 Last data filed at 07/02/2020 0600 Gross per 24 hour  Intake 3240.42 ml  Output 1900 ml  Net 1340.42 ml   Filed Weights   06/30/20 1300 07/01/20 0500 07/02/20 0500  Weight:  72.4 kg 73.7 kg 71 kg    Examination: General Appearance:  Looks criticall ill Head:  Normocephalic, without obvious abnormality, atraumatic Eyes:  PERRL - not tested, conjunctiva/corneas - weak/absent     Ears:  Normal external ear canals, both ears Nose:  G tube - yes left nostril Throat:  ETT TUBE - yes, OG tube - no Neck:  Supple,  No enlargement/tenderness/nodules Lungs: Clear to auscultation bilaterally, Ventilator   Synchrony - yes Heart:  S1 and S2 normal, no murmur, CVP - no.  Pressors - no Abdomen:  Soft, no masses, no organomegaly Genitalia / Rectal:  Not done Extremities:  Extremities- intact Skin:  ntact in exposed areas . Sacral area - not examined Neurologic:  Sedation - precedex gtt -> RASS - -4     Assessment & Plan:   Acute Respiratory Failure   07/02/2020 - > does not meet criteria for Extubation in setting of Acute Respiratory Failure due to acute encephalopathy due to below. Thought doing PSV  Plan  - no extubation  - do PSV as tolerated  - PRVC otherwise    Mechanical fall resulting in Left skull fracture and bilateral subarachnoid/subdural hematomas.   P: Management per neurosurgery      Left longitudinal temporal bone fracture with possible separation of the malleus and incus.   Plan  - opd ent followup per DR Suszanne Conners on 07/01/20  Possible seizure activity - EEG negative. P: Continue to monitor Continue Keppra  Hypertension - remains on high dose cleviprex.   P: Continue cleviprex as needed to maintain SBP goal less than 140 Add norvasc Continue home HCTZ Hydralazine and Labetalol PRN  Diabetes - diet controlled. P: SSI - change to mod scale and add tube feed coverage  Sepsis - unclear etiology at this point.  07/02/2020 - worsening PCT and persistent fever though wbc better. UA normal 7.14  P:  Continue empiric cefepime for now. Add vanc Might needs NSGY opinion if there is CSF leak   Alcohol intoxication. - ETOH level 26  on arrival, unsure if this is acute vs chronic  07/02/2020 - not in DTs. On precede  P: CIWA q4 hrs Monitor in the ICU setting  Supplement thiamin, folate, and vitamin B12   Acute Encephalopathy due to all of above  07/02/2020 - RASS -4 on precedex. Corneals seem weaker  Plan  -  Per NSGY  = continue precedex gtt  Electrolyte imbalance  07/02/2020 - low Mag and low phos and low k  Plan  - replete all  Best practice:  Diet: NPO Pain/Anxiety/Delirium protocol (if indicated): PRN  VAP protocol (if indicated): N/A DVT prophylaxis: SCD GI prophylaxis: PPI Glucose control: SSI Mobility: Bedrest  Code Status: Full Family Communication: Per primary  Disposition: ICU    ATTESTATION & SIGNATURE   The patient Farooq Petrovich is critically ill with multiple organ systems failure and requires high complexity decision making for assessment and support, frequent evaluation and titration of therapies, application of advanced monitoring technologies and extensive interpretation of multiple databases.   Critical Care Time devoted to patient care services described in this note is  35  Minutes. This time reflects time of  care of this signee Dr Kalman Shan. This critical care time does not reflect procedure time, or teaching time or supervisory time of PA/NP/Med student/Med Resident etc but could involve care discussion time     Dr. Kalman Shan, M.D., [facility].C.P Pulmonary and Critical Care Medicine Staff Physician [facility] Pager: [phone], If no answer or between  15:00h - 7:00h: call [phone]  07/02/2020 8:16 AM     LABS    PULMONARY Recent Labs  Lab 06/30/20 0414 06/30/20 1219  PHART 7.284* 7.411  PCO2ART 23.4* 27.0*  PO2ART 82* 72*  HCO3 11.1* 16.8*  TCO2 12* 18*  O2SAT 95.0 93.0    CBC Recent Labs  Lab 06/30/20 0024 06/30/20 0414 06/30/20 1219 07/01/20 0449 07/02/20 0448  HGB 13.6   < > 12.6*  12.0* 11.2*  HCT 42.1   < > 37.0* 36.1* 32.9*  WBC 18.8*  --   --  19.0* 8.9  PLT 184  --   --  183 119*   < > = values in this interval not displayed.    COAGULATION Recent Labs  Lab 06/30/20 0024  INR 1.2    CARDIAC  No results for input(s): TROPONINI in the last 168 hours. No results for input(s): PROBNP in the last 168 hours.   CHEMISTRY Recent Labs  Lab 06/29/20 1944 06/29/20 1944 06/30/20 0024 06/30/20 0024 06/30/20 0414 06/30/20 0414 06/30/20 1219 07/01/20 0655 07/01/20 1634 07/02/20 0448  NA 135  --  132*  --  134*  --  137  --   --  133*  K 3.9   < > 3.6   < > 3.8   < > 3.9  --   --  3.5  CL 96*  --  94*  --   --   --   --   --   --  100  CO2 22  --  16*  --   --   --   --   --   --  19*  GLUCOSE 206*  --  326*  --   --   --   --   --   --  270*  BUN 11  --  10  --   --   --   --   --   --  20  CREATININE 1.17  --  1.03  --   --   --   --   --   --  1.01  CALCIUM 9.5  --  9.4  --   --   --   --   --   --  8.9  MG  --   --  1.4*  --   --   --   --  2.0  --  1.8  PHOS  --   --  3.4  --   --   --   --  2.3* 2.5 2.0*   < > = values in this interval not displayed.   Estimated Creatinine Clearance: 63.5 mL/min (by C-G formula based on SCr of 1.01 mg/dL).   LIVER Recent Labs  Lab 06/30/20 0024 07/02/20 0448  AST  --  21  ALT  --  19  ALKPHOS  --  30*  BILITOT  --  0.8  PROT  --  6.4*  ALBUMIN  --  2.8*  INR 1.2  --      INFECTIOUS Recent Labs  Lab  06/30/20 1252 07/01/20 2221 07/02/20 0448  LATICACIDVEN 5.9* 3.8*  --   PROCALCITON 1.06  --  6.07     ENDOCRINE CBG (last 3)  Recent Labs    07/01/20 2310 07/02/20 0304 07/02/20 0734  GLUCAP 290* 279* 283*         IMAGING x48h  - image(s) personally visualized  -   highlighted in bold CT HEAD WO CONTRAST  Addendum Date: 07/01/2020   ADDENDUM REPORT: 07/01/2020 21:02 ADDENDUM: Study discussed by telephone with Dr. Juanetta Snow on 07/01/2020 at 2056 hours. Electronically Signed    By: Odessa Fleming M.D.   On: 07/01/2020 21:02   Result Date: 07/01/2020 CLINICAL DATA:  74 year old male with recent traumatic skull fractures and intracranial hemorrhage. Worsening altered mental status. EXAM: CT HEAD WITHOUT CONTRAST TECHNIQUE: Contiguous axial images were obtained from the base of the skull through the vertex without intravenous contrast. COMPARISON:  Head CT 0556 hours today and earlier. FINDINGS: Brain: Anterior bilateral frontal convexity subdural hematomas are possible measuring 6-7 mm in thickness (series 3, image 21 on the left) in addition to the extensive bifrontal hemorrhagic contusions and subarachnoid hemorrhage. Severe anterior frontal lobe edema. But relatively mild posterior displacement of the frontal horns. And there remains no midline shift (series 3, image 21). Small volume of intraventricular hemorrhage with no ventriculomegaly. Basilar cisterns remain patent. Scattered additional small bilateral hemorrhagic contusions and/or subarachnoid blood. There is a left occipital hemorrhagic contusion with adjacent edema on series 3, image 14. There are right greater than left inferior temporal lobe hemorrhagic contusions best seen on sagittal series 6 (image 14 on the right). Superimposed severe chronic appearing cerebral white matter disease. No definite acute cortically based infarct. Deep gray matter nuclei appear stable. Vascular: Calcified atherosclerosis at the skull base. No suspicious intracranial vascular hyperdensity. Skull: Left skull base and nondisplaced left posterior convexity calvarium fracture. There is also a nondisplaced left anterior frontal bone fracture of the calvarium suspected on series 4, image 54. No new osseous abnormality. Sinuses/Orbits: Stable layering hemorrhage in the left sphenoid sinus, left middle ear and mastoids with underlying skull base fracture better demonstrated on temporal bone CT yesterday. Stable and well pneumatized other sinuses and mastoids.  Other: Hemorrhage in the left external auditory canal contiguous with what appears to be a severe injury of the left pinna with soft tissue swelling. Broad-based posterior scalp hematoma. Visualized orbit soft tissues are within normal limits. IMPRESSION: 1. Extensive bifrontal hemorrhagic contusions with Possible Superimposed Anterior Convexity Subdural Hematomas, 6-7 mm bilaterally. Severe anterior frontal lobe edema, but still only mild posterior displacement of the frontal horns. 2. Superimposed bilateral temporal and other smaller hemorrhagic contusions. Scattered bilateral subarachnoid hemorrhage. 3. Small volume of intraventricular hemorrhage without ventriculomegaly. Basilar cisterns remain patent. No midline shift. 4. Underlying severe chronic white matter disease suspected. No definite acute infarct. 5. Multiple skull fractures suspected including a nondisplaced left frontal bone fracture. Stable hemorrhage in the left middle ear, mastoids, left external auditory canal, left sphenoid sinus. 6. Posterior scalp hematoma. Electronically Signed: By: Odessa Fleming M.D. On: 07/01/2020 20:53   CT HEAD WO CONTRAST  Result Date: 07/01/2020 CLINICAL DATA:  Follow-up subarachnoid hemorrhage; headache, posttraumatic. EXAM: CT HEAD WITHOUT CONTRAST TECHNIQUE: Contiguous axial images were obtained from the base of the skull through the vertex without intravenous contrast. COMPARISON:  Prior head CT examinations 06/30/2020 and earlier FINDINGS: Brain: Extensive acute subarachnoid hemorrhage overlying the bilateral cerebral convexities is similar to minimally increased as compared to the prior  head CT of 06/30/2020. Extra-axial hemorrhage along the anterior frontal lobe likely reflects a combination of subarachnoid and subdural hemorrhage and has slightly increased, now measuring 8 mm in greatest thickness (previously 6 mm). There has been continued blooming of extensive hemorrhagic parenchymal contusions within the  anteroinferior frontal lobes. Similar appearance of parenchymal contusions within the inferior frontal lobes and left temporal occipital lobes bilaterally. Stable small volume intraventricular hemorrhage. There is no midline shift or evidence of hydrocephalus. No cortically based infarct is definitively identified. Redemonstrated background chronic white matter disease and chronic left caudate nucleus lacunar infarct. Vascular: No hyperdense vessel Skull: Known left calvarial and skull base fractures. Sinuses/Orbits: Visualized orbits show no acute finding. Unchanged fluid within the sphenoid sinuses, likely at least partially reflecting blood products. Mild left maxillary sinus mucosal thickening. Extensive opacification of the left middle ear cavity and left external auditory canal with a dressing overlying the left external ear. Partial opacification of left mastoid air cells. Other: Redemonstrated left posterior scalp hematoma. IMPRESSION: 1. Continued interval blooming of hemorrhagic contusions within the anteroinferior frontal lobes bilaterally. Similar appearance of hemorrhagic contusions within the inferior temporal lobes and left temporal occipital lobes. 2. Extensive acute subarachnoid hemorrhage overlying the cerebral convexities, similar to minimally increased. 3. Extra-axial hemorrhage along the anterior frontal lobes bilaterally, likely reflecting a combination of subarachnoid and subdural hemorrhage, slightly increased (now measuring 8 mm in greatest thickness). 4. Stable, small volume intraventricular hemorrhage. No hydrocephalus. 5. Known left calvarial and skull base fractures. 6. As before, there is extensive opacification of the left middle ear cavity and left external auditory canal, likely reflecting the presence of blood products. 7. Redemonstrated left posterior scalp hematoma. Electronically Signed   By: Jackey Loge DO   On: 07/01/2020 09:05   DG Chest Port 1 View  Result Date:  07/02/2020 CLINICAL DATA:  74 year old male with history of respiratory failure. EXAM: PORTABLE CHEST 1 VIEW COMPARISON:  Chest x-ray 07/01/2020. FINDINGS: An endotracheal tube is in place with tip 3.8 cm above the carina. Feeding tube is position coiled in the stomach with tip adjacent to the gastroesophageal junction. Lung volumes are slightly low. Opacity at the left base which may reflect atelectasis and/or consolidation. Trace left pleural effusion. Right lung is clear. No right pleural effusion. No pneumothorax. No evidence of pulmonary edema. No definite suspicious appearing pulmonary nodules or masses are noted. Heart size is normal. Upper mediastinal contours are within normal limits. Aortic atherosclerosis. IMPRESSION: 1. Support apparatus, as above. 2. Persistent atelectasis and/or consolidation in the left lower lobe. Trace left pleural effusion. Electronically Signed   By: Trudie Reed M.D.   On: 07/02/2020 07:32   DG CHEST PORT 1 VIEW  Result Date: 07/01/2020 CLINICAL DATA:  75 year old male status post intubation. EXAM: PORTABLE CHEST 1 VIEW COMPARISON:  Chest radiograph dated 06/30/2020. FINDINGS: Endotracheal tube with tip approximately 6 cm above the carina. Feeding tube extends below the diaphragm with tip in the left upper abdomen in the proximal stomach. There has been interval development of a patchy area of airspace density at the left lung base which may represent developing infiltrate or aspiration. Clinical correlation is recommended. Minimal right infrahilar density also noted. There is no pleural effusion or pneumothorax. The cardiac silhouette is within limits. Atherosclerotic calcification of the aortic arch. No acute osseous pathology. IMPRESSION: 1. Endotracheal tube above the carina. 2. Feeding tube with tip in the proximal stomach. 3. Interval development of left lung base airspace opacity. Electronically Signed   By:  Elgie CollardArash  Radparvar M.D.   On: 07/01/2020 20:36   DG Abd  Portable 1V  Result Date: 06/30/2020 CLINICAL DATA:  Feeding tube placement. EXAM: PORTABLE ABDOMEN - 1 VIEW COMPARISON:  None. FINDINGS: The bowel gas pattern is normal. Distal tip of feeding tube is seen in proximal stomach. No radio-opaque calculi or other significant radiographic abnormality are seen. IMPRESSION: Distal tip of feeding tube seen in proximal stomach. Electronically Signed   By: Lupita RaiderJames  Green Jr M.D.   On: 06/30/2020 13:03   EEG adult  Result Date: 06/30/2020 Charlsie QuestYadav, Priyanka O, MD     06/30/2020 10:43 AM Patient Name: Storm FriskJames Vitrano MRN: [id number] Epilepsy Attending: Charlsie QuestPriyanka O Yadav Referring Physician/Provider: Rutherford Guysahul Desai, PA Date: 06/30/2020 Duration: 24.28 mins Patient history: 74yo M with SAH and seizure like activity. EEG to evaluate for seizure. Level of alertness: Awake/ lethargic AEDs during EEG study: LEV, ativan Technical aspects: This EEG study was done with scalp electrodes positioned according to the 10-20 International system of electrode placement. Electrical activity was acquired at a sampling rate of 500Hz  and reviewed with a high frequency filter of 70Hz  and a low frequency filter of 1Hz . EEG data were recorded continuously and digitally stored. Description: EEG showed continuous generalized 3 to 6 Hz theta-delta slowing admixed with 15 to 18 Hz, 2-3 uV beta activity with irregular morphology distributed symmetrically and diffusely.  Hyperventilation and photic stimulation were not performed.   ABNORMALITY -Continuous slow, generalized -Excessive beta, generalized IMPRESSION: This study is suggestive of moderate diffuse encephalopathy, nonspecific etiology.The excessive beta activity seen in the background is most likely due to the effect of benzodiazepine and is a benign EEG pattern. No seizures or epileptiform discharges were seen throughout the recording. Priyanka Annabelle Harman Yadav

## 2020-07-03 ENCOUNTER — Inpatient Hospital Stay (HOSPITAL_COMMUNITY): Payer: Medicare HMO

## 2020-07-03 DIAGNOSIS — J9601 Acute respiratory failure with hypoxia: Secondary | ICD-10-CM

## 2020-07-03 LAB — BASIC METABOLIC PANEL
Anion gap: 11 (ref 5–15)
BUN: 20 mg/dL (ref 8–23)
CO2: 20 mmol/L — ABNORMAL LOW (ref 22–32)
Calcium: 9.4 mg/dL (ref 8.9–10.3)
Chloride: 109 mmol/L (ref 98–111)
Creatinine, Ser: 0.77 mg/dL (ref 0.61–1.24)
GFR calc Af Amer: >60 mL/min (ref >60)
GFR calc non Af Amer: >60 mL/min (ref >60)
Glucose, Bld: 279 mg/dL — ABNORMAL HIGH (ref 70–99)
Potassium: 4.1 mmol/L (ref 3.5–5.1)
Sodium: 140 mmol/L (ref 135–145)

## 2020-07-03 LAB — POCT I-STAT 7, (LYTES, BLD GAS, ICA,H+H)
Acid-base deficit: 1 mmol/L (ref 0.0–2.0)
Bicarbonate: 21.3 mmol/L (ref 20.0–28.0)
Calcium, Ion: 1.21 mmol/L (ref 1.15–1.40)
HCT: 35 % — ABNORMAL LOW (ref 39.0–52.0)
Hemoglobin: 11.9 g/dL — ABNORMAL LOW (ref 13.0–17.0)
O2 Saturation: 100 %
Patient temperature: 102.3
Potassium: 3.4 mmol/L — ABNORMAL LOW (ref 3.5–5.1)
Sodium: 135 mmol/L (ref 135–145)
TCO2: 22 mmol/L (ref 22–32)
pCO2 arterial: 31.6 mmHg — ABNORMAL LOW (ref 32.0–48.0)
pH, Arterial: 7.444 (ref 7.350–7.450)
pO2, Arterial: 235 mmHg — ABNORMAL HIGH (ref 83.0–108.0)

## 2020-07-03 LAB — CBC
HCT: 31.9 % — ABNORMAL LOW (ref 39.0–52.0)
Hemoglobin: 10.2 g/dL — ABNORMAL LOW (ref 13.0–17.0)
MCH: 28.1 pg (ref 26.0–34.0)
MCHC: 32 g/dL (ref 30.0–36.0)
MCV: 87.9 fL (ref 80.0–100.0)
Platelets: 161 10*3/uL (ref 150–400)
RBC: 3.63 MIL/uL — ABNORMAL LOW (ref 4.22–5.81)
RDW: 13.2 % (ref 11.5–15.5)
WBC: 10.7 10*3/uL — ABNORMAL HIGH (ref 4.0–10.5)
nRBC: 0 % (ref 0.0–0.2)

## 2020-07-03 LAB — MAGNESIUM: Magnesium: 2.1 mg/dL (ref 1.7–2.4)

## 2020-07-03 LAB — SODIUM: Sodium: 142 mmol/L (ref 135–145)

## 2020-07-03 LAB — PHOSPHORUS: Phosphorus: 2 mg/dL — ABNORMAL LOW (ref 2.5–4.6)

## 2020-07-03 LAB — GLUCOSE, CAPILLARY
Glucose-Capillary: 302 mg/dL — ABNORMAL HIGH (ref 70–99)
Glucose-Capillary: 324 mg/dL — ABNORMAL HIGH (ref 70–99)
Glucose-Capillary: 225 mg/dL — ABNORMAL HIGH (ref 70–99)
Glucose-Capillary: 306 mg/dL — ABNORMAL HIGH (ref 70–99)
Glucose-Capillary: 272 mg/dL — ABNORMAL HIGH (ref 70–99)
Glucose-Capillary: 289 mg/dL — ABNORMAL HIGH (ref 70–99)

## 2020-07-03 MED ORDER — INSULIN ASPART 100 UNIT/ML ~~LOC~~ SOLN
4.0000 [IU] | SUBCUTANEOUS | Status: DC
  Administered 2020-07-03 – 2020-07-04 (×6): 4 [IU] via SUBCUTANEOUS

## 2020-07-03 MED ORDER — INSULIN DETEMIR 100 UNIT/ML ~~LOC~~ SOLN
12.0000 [IU] | Freq: Two times a day (BID) | SUBCUTANEOUS | Status: DC
  Administered 2020-07-03 – 2020-07-04 (×2): 12 [IU] via SUBCUTANEOUS
  Filled 2020-07-03 (×3): qty 0.12

## 2020-07-03 MED ORDER — INSULIN ASPART 100 UNIT/ML ~~LOC~~ SOLN
0.0000 [IU] | SUBCUTANEOUS | Status: DC
  Administered 2020-07-03: 7 [IU] via SUBCUTANEOUS
  Administered 2020-07-03: 11 [IU] via SUBCUTANEOUS
  Administered 2020-07-03 (×2): 15 [IU] via SUBCUTANEOUS
  Administered 2020-07-04 (×5): 11 [IU] via SUBCUTANEOUS
  Administered 2020-07-04: 15 [IU] via SUBCUTANEOUS
  Administered 2020-07-04 – 2020-07-05 (×4): 11 [IU] via SUBCUTANEOUS
  Administered 2020-07-05: 7 [IU] via SUBCUTANEOUS
  Administered 2020-07-05: 11 [IU] via SUBCUTANEOUS
  Administered 2020-07-06: 7 [IU] via SUBCUTANEOUS
  Administered 2020-07-06 (×2): 11 [IU] via SUBCUTANEOUS
  Administered 2020-07-06: 7 [IU] via SUBCUTANEOUS
  Administered 2020-07-06 – 2020-07-07 (×6): 11 [IU] via SUBCUTANEOUS
  Administered 2020-07-07: 15 [IU] via SUBCUTANEOUS
  Administered 2020-07-07: 11 [IU] via SUBCUTANEOUS
  Administered 2020-07-08: 4 [IU] via SUBCUTANEOUS
  Administered 2020-07-08 (×2): 7 [IU] via SUBCUTANEOUS
  Administered 2020-07-08: 11 [IU] via SUBCUTANEOUS
  Administered 2020-07-08 (×2): 4 [IU] via SUBCUTANEOUS
  Administered 2020-07-09 (×2): 11 [IU] via SUBCUTANEOUS
  Administered 2020-07-09: 7 [IU] via SUBCUTANEOUS
  Administered 2020-07-09 (×3): 4 [IU] via SUBCUTANEOUS
  Administered 2020-07-10: 7 [IU] via SUBCUTANEOUS
  Administered 2020-07-10: 11 [IU] via SUBCUTANEOUS
  Administered 2020-07-10: 7 [IU] via SUBCUTANEOUS
  Administered 2020-07-10: 11 [IU] via SUBCUTANEOUS
  Administered 2020-07-10: 4 [IU] via SUBCUTANEOUS
  Administered 2020-07-10: 7 [IU] via SUBCUTANEOUS
  Administered 2020-07-11 (×4): 4 [IU] via SUBCUTANEOUS
  Administered 2020-07-11: 7 [IU] via SUBCUTANEOUS
  Administered 2020-07-11: 4 [IU] via SUBCUTANEOUS
  Administered 2020-07-12 (×3): 7 [IU] via SUBCUTANEOUS
  Administered 2020-07-12 – 2020-07-13 (×4): 4 [IU] via SUBCUTANEOUS
  Administered 2020-07-13 (×2): 3 [IU] via SUBCUTANEOUS
  Administered 2020-07-13 (×2): 7 [IU] via SUBCUTANEOUS
  Administered 2020-07-14: 3 [IU] via SUBCUTANEOUS
  Administered 2020-07-14 (×5): 4 [IU] via SUBCUTANEOUS
  Administered 2020-07-15: 3 [IU] via SUBCUTANEOUS
  Administered 2020-07-15: 4 [IU] via SUBCUTANEOUS
  Administered 2020-07-15 (×2): 3 [IU] via SUBCUTANEOUS
  Administered 2020-07-15: 7 [IU] via SUBCUTANEOUS
  Administered 2020-07-16 (×3): 3 [IU] via SUBCUTANEOUS
  Administered 2020-07-16: 4 [IU] via SUBCUTANEOUS
  Administered 2020-07-16: 3 [IU] via SUBCUTANEOUS
  Administered 2020-07-16: 4 [IU] via SUBCUTANEOUS
  Administered 2020-07-17: 3 [IU] via SUBCUTANEOUS
  Administered 2020-07-18: 4 [IU] via SUBCUTANEOUS
  Administered 2020-07-18: 3 [IU] via SUBCUTANEOUS

## 2020-07-03 NOTE — Progress Notes (Signed)
NAME:  Harold Mckee, MRN:  [id number], DOB:  Sep 02, 1946, LOS: 4 ADMISSION DATE:  06/29/2020, CONSULTATION DATE:  06/29/2020 REFERRING MD:  Dr. Jake Samples, CHIEF COMPLAINT:  Bilateral subarachnoid/subdural hematomas   Brief History    Harold Mckee is a 74yo make with PMX significant for HTN and Diabetes who presented after ground level fall resulting in head injury while walking his dog in the park. Per EMS patient was seen drinking alcohol prior to fall.   On arrival patient was seen bleeding from his left ear. Vitals on arrival significant for mild tachypnea and hypertension. Labwork significant for hyperglycemia and mild leukocytosis. CT C-spin and head positive for  left scull fracture and bilateral subarachnoid/subdural hematomas. PCCM consulted for assistance in admission.   Past Medical History  HTN  Diabetes   Significant Hospital Events   Admitted 7/13  7.15  - Started on empiric cefepime 7/14 due to fever and leukocytosis.  PCT 1.06 and lactate 5.9. Remains on cleviprex at 20. Mental status about the same, minimally responsive. CT head read pending.   Consults:  Neurosurgery   ENT 7/15 - Dr Suszanne Conners - >  Left longitudinal temporal bone fracture with possible separation of the malleus and incus. - Cotton ball packing of the left ear canal. Change cotton ball as needed.  The T-bone CT reviewed. No obvious otic capsule involvement.   No acute ENT intervention needed at this time. - The patient will need hearing test as an outpatient in my office. My need middle ear exploration if significant conductive hearing loss is noted.   Procedures:    Significant Diagnostic Tests:  CT C-spine and head 7/13 > 1. Nondisplaced fracture of the left parietal calvarium along the left lambdoid suture extending into the left occipital calvarium and left temporal bone with involvement of the left mastoid air cell and external auditory canal. Dedicated temporal bone CT is recommended for further  evaluation. 2. Bilateral subdural and subarachnoid hemorrhages. No midline shift. 3. Focal hemorrhage with small pockets of pneumocephalus in the left posterior temporal lobe along the calvarial fracture. This likely represents combination of subarachnoid and intraparenchymal hemorrhage. 4. No acute/traumatic cervical spine pathology. CT head 7/14 > blooming contusions in bilateral inferior frontal, bilateral, inferior temporal, and left parieto-occipital lobes.  Generalized increased in SAH, mild IVH without hydrocephalus.  Progressive scalp swelling over left calvarial fracture.  No pneumocephalus.  CT temporal bones 7/14 > longitudinal left temporal bone fx.  Hemotympanum  On left with pneuocephalus and soft tissue emphysema. EEG 7/14 > mod diffuse encephalopathy.  No seizures or epileptiform activity noted. Head CT 17 >> no change, extensive hemorrhagic contusions in the frontal lobes bilaterally, bifrontal subdural hematoma, diffuse subarachnoid hemorrhage, IVH without hydrocephalus  Micro Data:  COVID 7/13 > negative  Blood 7/13 >   Antimicrobials:   Cefepime 7/13 >   Interim history/subjective:  Remains poorly responsive on low-dose Precedex.  Repeat head CT stable as above Hyperglycemia overnight, sliding scale insulin uptitrated    Objective   Blood pressure 129/75, pulse 93, temperature (!) 100.5 F (38.1 C), temperature source Axillary, resp. rate 18, height 6\' 1"  (1.854 m), weight 72.2 kg, SpO2 100 %.    Vent Mode: PSV;CPAP FiO2 (%):  [30 %-50 %] 30 % Set Rate:  [16 bmp] 16 bmp Vt Set:  [620 mL-630 mL] 630 mL PEEP:  [5 cmH20] 5 cmH20 Pressure Support:  [5 cmH20-10 cmH20] 5 cmH20 Plateau Pressure:  [17 cmH20-18 cmH20] 18 cmH20   Intake/Output Summary (Last  24 hours) at 07/03/2020 1021 Last data filed at 07/03/2020 1000 Gross per 24 hour  Intake 3845.27 ml  Output 4025 ml  Net -179.73 ml   Filed Weights   07/01/20 0500 07/02/20 0500 07/03/20 0444  Weight: 73.7  kg 71 kg 72.2 kg    Examination: General Appearance: Ill-appearing man, intubated ventilated Head: Normocephalic Eyes: Pupils small but equal, react to light Ears: Cotton in left ear, no active bleeding noted Nose: NG tube present Throat: ET tube in place, oropharynx otherwise clear Neck: Normal Lungs: Clear bilaterally, tolerating PSV Heart: Distant, clear, no murmur Abdomen: Nondistended, positive bowel sounds Extremities: No edema Skin: No rash Neurologic: Completely unresponsive, may have very subtle grimace to pain, did not move spontaneously, with pain or to command currently on Precedex 0.4    Assessment & Plan:   Acute Respiratory Failure, ventilator dependence, possibly due to encephalopathy and inability to protect airway -Tolerating PSV but does not have the mental status or airway protection currently to attempt extubation. -Plan to continue ventilator support pending resolution of his encephalopathy, okay for PSV ad lib. -Pulmonary hygiene -VAP prevention orders  Mechanical fall resulting in Left skull fracture and bilateral subarachnoid/subdural hematomas.  -Management as per neurosurgery recommendations -Currently on 3% saline at 25 cc/h, following BMP at hours -Repeat CT head 7/17 as above  Left longitudinal temporal bone fracture with possible separation of the malleus and incus. -Appreciate Dr. Avel Sensor input and assistance.  No indication for surgery at this time but he will need follow-up, assessment of his hearing, possible middle ear exploration in the future depending on status   Possible seizure activity - EEG negative. P: -Following on Keppra   Hypertension - remains on high dose cleviprex. Cleviprex off P: -Continue amlodipine, HCTZ -Hydralazine, labetalol as needed  Diabetes - diet controlled. P: -Sliding-scale insulin -increase TF coverage 7/17 -increase Levemir to 12u bid 7/17  Sepsis - unclear etiology at this point.  No clear source,  consider fever neurological P:  -Empiric cefepime, vancomycin added 7/16 -Follow for another day clinically, may be able to narrow antibiotics 7/18   Alcohol intoxication. - ETOH level 26 on arrival, unsure if this is acute vs chronic P: -No current evidence withdrawal (actually obtunded) -Will follow for any evidence of alcohol withdrawal once his mental status improves -Thiamine, folate supplementation  Acute Encephalopathy due to all of above -Follow mental status, CT stable, no evidence EEG abnormality (consider repeat?) -Empiric Keppra as ordered -Wean Precedex to off and follow for wake up  Electrolyte imbalance -Follow BMP and replace electrolytes as indicated  Best practice:  Diet: NPO Pain/Anxiety/Delirium protocol (if indicated): PRN  VAP protocol (if indicated): N/A DVT prophylaxis: SCD GI prophylaxis: PPI Glucose control: SSI Mobility: Bedrest  Code Status: Full Family Communication: Per primary  Disposition: ICU    ATTESTATION & SIGNATURE   Independent critical care time 32 minutes  Levy Pupa, MD, PhD 07/03/2020, 10:43 AM [facility] [phone] or if no answer [phone]   LABS    PULMONARY Recent Labs  Lab 06/30/20 0414 06/30/20 1219  PHART 7.284* 7.411  PCO2ART 23.4* 27.0*  PO2ART 82* 72*  HCO3 11.1* 16.8*  TCO2 12* 18*  O2SAT 95.0 93.0    CBC Recent Labs  Lab 06/30/20 0024 06/30/20 0414 06/30/20 1219 07/01/20 0449 07/02/20 0448  HGB 13.6   < > 12.6* 12.0* 11.2*  HCT 42.1   < > 37.0* 36.1* 32.9*  WBC 18.8*  --   --  19.0*  8.9  PLT 184  --   --  183 119*   < > = values in this interval not displayed.    COAGULATION Recent Labs  Lab 06/30/20 0024  INR 1.2    CARDIAC  No results for input(s): TROPONINI in the last 168 hours. No results for input(s): PROBNP in the last 168 hours.   CHEMISTRY Recent Labs  Lab 06/29/20 1944 06/29/20 1944 06/30/20 0024 06/30/20 0024 06/30/20 0414  06/30/20 0414 06/30/20 1219 07/01/20 0655 07/01/20 1634 07/02/20 0448 07/02/20 1420 07/02/20 2221  NA 135   < > 132*   < > 134*  --  137  --   --  133* 134* 135  K 3.9   < > 3.6   < > 3.8   < > 3.9  --   --  3.5  --   --   CL 96*  --  94*  --   --   --   --   --   --  100  --   --   CO2 22  --  16*  --   --   --   --   --   --  19*  --   --   GLUCOSE 206*  --  326*  --   --   --   --   --   --  270*  --   --   BUN 11  --  10  --   --   --   --   --   --  20  --   --   CREATININE 1.17  --  1.03  --   --   --   --   --   --  1.01  --   --   CALCIUM 9.5  --  9.4  --   --   --   --   --   --  8.9  --   --   MG  --   --  1.4*  --   --   --   --  2.0  --  1.8  --   --   PHOS  --   --  3.4  --   --   --   --  2.3* 2.5 2.0*  --   --    < > = values in this interval not displayed.   Estimated Creatinine Clearance: 64.5 mL/min (by C-G formula based on SCr of 1.01 mg/dL).   LIVER Recent Labs  Lab 06/30/20 0024 07/02/20 0448  AST  --  21  ALT  --  19  ALKPHOS  --  30*  BILITOT  --  0.8  PROT  --  6.4*  ALBUMIN  --  2.8*  INR 1.2  --      INFECTIOUS Recent Labs  Lab 06/30/20 1252 07/01/20 2221 07/02/20 0448  LATICACIDVEN 5.9* 3.8*  --   PROCALCITON 1.06  --  6.07     ENDOCRINE CBG (last 3)  Recent Labs    07/02/20 2306 07/03/20 0313 07/03/20 0837  GLUCAP 267* 302* 324*

## 2020-07-03 NOTE — Progress Notes (Signed)
eLink Physician-Brief Progress Note Patient Name: Major Santerre DOB: [birth year] MRN: [id number]   Date of Service  07/03/2020  HPI/Events of Note  Notified of persistent hyperglycemia.  eICU Interventions  Increase insulin sliding scale to resistant scale.     Intervention Category Intermediate Interventions: Hyperglycemia - evaluation and treatment  Larinda Buttery 07/03/2020, 4:45 AM

## 2020-07-03 NOTE — Progress Notes (Signed)
Subjective: Patient reports Patient remains intubated and sedated  Objective: Vital signs in last 24 hours: Temp:  [98.6 F (37 C)-100.5 F (38.1 C)] 100.5 F (38.1 C) (07/17 0800) Pulse Rate:  [84-119] 99 (07/17 0900) Resp:  [16-24] 20 (07/17 0900) BP: (92-152)/(63-82) 115/68 (07/17 0900) SpO2:  [100 %] 100 % (07/17 0900) FiO2 (%):  [30 %-50 %] 30 % (07/17 0800) Weight:  [72.2 kg] 72.2 kg (07/17 0444)  Intake/Output from previous day: 07/16 0701 - 07/17 0700 In: 3183.3 [I.V.:1246.7; NG/GT:715; IV Piggyback:1221.5] Out: 4025 [Urine:4025] Intake/Output this shift: Total I/O In: 967.4 [I.V.:57.4; NG/GT:910] Out: -   Minimal movement but recently went up on his Precedex  Lab Results: Recent Labs    07/01/20 0449 07/02/20 0448  WBC 19.0* 8.9  HGB 12.0* 11.2*  HCT 36.1* 32.9*  PLT 183 119*   BMET Recent Labs    06/30/20 1219 06/30/20 1219 07/02/20 0448 07/02/20 0448 07/02/20 1420 07/02/20 2221  NA 137   < > 133*   < > 134* 135  K 3.9  --  3.5  --   --   --   CL  --   --  100  --   --   --   CO2  --   --  19*  --   --   --   GLUCOSE  --   --  270*  --   --   --   BUN  --   --  20  --   --   --   CREATININE  --   --  1.01  --   --   --   CALCIUM  --   --  8.9  --   --   --    < > = values in this interval not displayed.    Studies/Results: CT HEAD WO CONTRAST  Result Date: 07/03/2020 CLINICAL DATA:  Posttraumatic head injury. EXAM: CT HEAD WITHOUT CONTRAST TECHNIQUE: Contiguous axial images were obtained from the base of the skull through the vertex without intravenous contrast. COMPARISON:  CT head 07/01/2020 FINDINGS: Brain: Extensive hemorrhagic contusions in both frontal lobes with surrounding edema. 8 mm subdural hematoma anterior to the left frontal lobe. 4 mm subdural him hematoma anterior to the right frontal lobe. These findings are unchanged. Smaller hemorrhagic contusion left occipital parietal lobe unchanged. Hemorrhagic contusion left anterior  temporal lobe unchanged. Small left parietal subdural hematoma unchanged. Small hemorrhagic contusion right temporal lobe. Diffuse subarachnoid hemorrhage unchanged. There is blood layering in the occipital pole horns bilaterally unchanged. No hydrocephalus. No midline shift. Diffuse white matter hypodensity bilaterally is stable. Vascular: Negative for hyperdense vessel. Skull: Fracture through the left mastoid sinus and temporal bone unchanged. Diastasis left lambdoid suture. Sinuses/Orbits: Mucosal edema paranasal sinuses. Air-fluid level sphenoid sinus unchanged. NG tube in place. Other: None IMPRESSION: Stable CT without interval change Extensive hemorrhagic contusions in the frontal lobes bilaterally with bifrontal subdural hematomas. Diffuse subarachnoid hemorrhage. Intraventricular hemorrhage without hydrocephalus. Electronically Signed   By: Marlan Palau M.D.   On: 07/03/2020 08:46   CT HEAD WO CONTRAST  Addendum Date: 07/01/2020   ADDENDUM REPORT: 07/01/2020 21:02 ADDENDUM: Study discussed by telephone with Dr. Juanetta Snow on 07/01/2020 at 2056 hours. Electronically Signed   By: Odessa Fleming M.D.   On: 07/01/2020 21:02   Result Date: 07/01/2020 CLINICAL DATA:  74 year old male with recent traumatic skull fractures and intracranial hemorrhage. Worsening altered mental status. EXAM: CT HEAD WITHOUT CONTRAST TECHNIQUE: Contiguous axial  images were obtained from the base of the skull through the vertex without intravenous contrast. COMPARISON:  Head CT 0556 hours today and earlier. FINDINGS: Brain: Anterior bilateral frontal convexity subdural hematomas are possible measuring 6-7 mm in thickness (series 3, image 21 on the left) in addition to the extensive bifrontal hemorrhagic contusions and subarachnoid hemorrhage. Severe anterior frontal lobe edema. But relatively mild posterior displacement of the frontal horns. And there remains no midline shift (series 3, image 21). Small volume of intraventricular  hemorrhage with no ventriculomegaly. Basilar cisterns remain patent. Scattered additional small bilateral hemorrhagic contusions and/or subarachnoid blood. There is a left occipital hemorrhagic contusion with adjacent edema on series 3, image 14. There are right greater than left inferior temporal lobe hemorrhagic contusions best seen on sagittal series 6 (image 14 on the right). Superimposed severe chronic appearing cerebral white matter disease. No definite acute cortically based infarct. Deep gray matter nuclei appear stable. Vascular: Calcified atherosclerosis at the skull base. No suspicious intracranial vascular hyperdensity. Skull: Left skull base and nondisplaced left posterior convexity calvarium fracture. There is also a nondisplaced left anterior frontal bone fracture of the calvarium suspected on series 4, image 54. No new osseous abnormality. Sinuses/Orbits: Stable layering hemorrhage in the left sphenoid sinus, left middle ear and mastoids with underlying skull base fracture better demonstrated on temporal bone CT yesterday. Stable and well pneumatized other sinuses and mastoids. Other: Hemorrhage in the left external auditory canal contiguous with what appears to be a severe injury of the left pinna with soft tissue swelling. Broad-based posterior scalp hematoma. Visualized orbit soft tissues are within normal limits. IMPRESSION: 1. Extensive bifrontal hemorrhagic contusions with Possible Superimposed Anterior Convexity Subdural Hematomas, 6-7 mm bilaterally. Severe anterior frontal lobe edema, but still only mild posterior displacement of the frontal horns. 2. Superimposed bilateral temporal and other smaller hemorrhagic contusions. Scattered bilateral subarachnoid hemorrhage. 3. Small volume of intraventricular hemorrhage without ventriculomegaly. Basilar cisterns remain patent. No midline shift. 4. Underlying severe chronic white matter disease suspected. No definite acute infarct. 5. Multiple  skull fractures suspected including a nondisplaced left frontal bone fracture. Stable hemorrhage in the left middle ear, mastoids, left external auditory canal, left sphenoid sinus. 6. Posterior scalp hematoma. Electronically Signed: By: Odessa Fleming M.D. On: 07/01/2020 20:53   DG Chest Port 1 View  Result Date: 07/02/2020 CLINICAL DATA:  74 year old male with history of respiratory failure. EXAM: PORTABLE CHEST 1 VIEW COMPARISON:  Chest x-ray 07/01/2020. FINDINGS: An endotracheal tube is in place with tip 3.8 cm above the carina. Feeding tube is position coiled in the stomach with tip adjacent to the gastroesophageal junction. Lung volumes are slightly low. Opacity at the left base which may reflect atelectasis and/or consolidation. Trace left pleural effusion. Right lung is clear. No right pleural effusion. No pneumothorax. No evidence of pulmonary edema. No definite suspicious appearing pulmonary nodules or masses are noted. Heart size is normal. Upper mediastinal contours are within normal limits. Aortic atherosclerosis. IMPRESSION: 1. Support apparatus, as above. 2. Persistent atelectasis and/or consolidation in the left lower lobe. Trace left pleural effusion. Electronically Signed   By: Trudie Reed M.D.   On: 07/02/2020 07:32   DG CHEST PORT 1 VIEW  Result Date: 07/01/2020 CLINICAL DATA:  74 year old male status post intubation. EXAM: PORTABLE CHEST 1 VIEW COMPARISON:  Chest radiograph dated 06/30/2020. FINDINGS: Endotracheal tube with tip approximately 6 cm above the carina. Feeding tube extends below the diaphragm with tip in the left upper abdomen in the proximal stomach.  There has been interval development of a patchy area of airspace density at the left lung base which may represent developing infiltrate or aspiration. Clinical correlation is recommended. Minimal right infrahilar density also noted. There is no pleural effusion or pneumothorax. The cardiac silhouette is within limits.  Atherosclerotic calcification of the aortic arch. No acute osseous pathology. IMPRESSION: 1. Endotracheal tube above the carina. 2. Feeding tube with tip in the proximal stomach. 3. Interval development of left lung base airspace opacity. Electronically Signed   By: Elgie Collard M.D.   On: 07/01/2020 20:36    Assessment/Plan: Bifrontal contusions minimal movement however intubated and sedated.  Overall neurologic Sam appears stable and CT scan minimal change  LOS: 4 days     Mariam Dollar 07/03/2020, 9:50 AM

## 2020-07-03 NOTE — Progress Notes (Signed)
PT Cancellation Note  Patient Details Name: Harold Mckee MRN: [id number] DOB: 01/31/1946   Cancelled Treatment:    Reason Eval/Treat Not Completed: Patient's level of consciousness;Patient not medically ready. PT intubated and off sedation but RN reports pt remains unable to follow commands, most alert he has been is opening eyes with no movement of extremities noted. Acute PT will sign off at this time as the pt has been unable to follow commands or participate in skilled PT intervention for 5 consecutive days. If the patient becomes better able to follow commands and participate in PT intervention please re-consult PT services.   Arlyss Gandy 07/03/2020, 2:40 PM

## 2020-07-04 LAB — CBC
HCT: 31.8 % — ABNORMAL LOW (ref 39.0–52.0)
Hemoglobin: 10 g/dL — ABNORMAL LOW (ref 13.0–17.0)
MCH: 28 pg (ref 26.0–34.0)
MCHC: 31.4 g/dL (ref 30.0–36.0)
MCV: 89.1 fL (ref 80.0–100.0)
Platelets: 153 10*3/uL (ref 150–400)
RBC: 3.57 MIL/uL — ABNORMAL LOW (ref 4.22–5.81)
RDW: 13.4 % (ref 11.5–15.5)
WBC: 11.8 10*3/uL — ABNORMAL HIGH (ref 4.0–10.5)
nRBC: 0 % (ref 0.0–0.2)

## 2020-07-04 LAB — CULTURE, RESPIRATORY W GRAM STAIN: Culture: NORMAL

## 2020-07-04 LAB — BASIC METABOLIC PANEL
Anion gap: 12 (ref 5–15)
BUN: 24 mg/dL — ABNORMAL HIGH (ref 8–23)
CO2: 21 mmol/L — ABNORMAL LOW (ref 22–32)
Calcium: 9.6 mg/dL (ref 8.9–10.3)
Chloride: 112 mmol/L — ABNORMAL HIGH (ref 98–111)
Creatinine, Ser: 0.8 mg/dL (ref 0.61–1.24)
GFR calc Af Amer: >60 mL/min (ref >60)
GFR calc non Af Amer: >60 mL/min (ref >60)
Glucose, Bld: 308 mg/dL — ABNORMAL HIGH (ref 70–99)
Potassium: 4.7 mmol/L (ref 3.5–5.1)
Sodium: 145 mmol/L (ref 135–145)

## 2020-07-04 LAB — SODIUM
Sodium: 144 mmol/L (ref 135–145)
Sodium: 145 mmol/L (ref 135–145)

## 2020-07-04 LAB — GLUCOSE, CAPILLARY
Glucose-Capillary: 260 mg/dL — ABNORMAL HIGH (ref 70–99)
Glucose-Capillary: 254 mg/dL — ABNORMAL HIGH (ref 70–99)
Glucose-Capillary: 279 mg/dL — ABNORMAL HIGH (ref 70–99)
Glucose-Capillary: 275 mg/dL — ABNORMAL HIGH (ref 70–99)
Glucose-Capillary: 264 mg/dL — ABNORMAL HIGH (ref 70–99)
Glucose-Capillary: 348 mg/dL — ABNORMAL HIGH (ref 70–99)

## 2020-07-04 LAB — PHOSPHORUS: Phosphorus: 2.5 mg/dL (ref 2.5–4.6)

## 2020-07-04 MED ORDER — METOPROLOL TARTRATE 50 MG PO TABS
50.0000 mg | ORAL_TABLET | Freq: Two times a day (BID) | ORAL | Status: DC
  Administered 2020-07-04 – 2020-07-25 (×44): 50 mg
  Filled 2020-07-04 (×44): qty 1

## 2020-07-04 MED ORDER — HYDROCHLOROTHIAZIDE 50 MG PO TABS
50.0000 mg | ORAL_TABLET | Freq: Every day | ORAL | Status: DC
  Administered 2020-07-05 – 2020-07-15 (×11): 50 mg
  Filled 2020-07-04: qty 2
  Filled 2020-07-04 (×2): qty 1
  Filled 2020-07-04 (×2): qty 2
  Filled 2020-07-04: qty 1
  Filled 2020-07-04 (×3): qty 2
  Filled 2020-07-04 (×2): qty 1
  Filled 2020-07-04 (×2): qty 2
  Filled 2020-07-04: qty 1
  Filled 2020-07-04: qty 2
  Filled 2020-07-04 (×3): qty 1
  Filled 2020-07-04 (×2): qty 2
  Filled 2020-07-04 (×2): qty 1

## 2020-07-04 MED ORDER — INSULIN DETEMIR 100 UNIT/ML ~~LOC~~ SOLN
15.0000 [IU] | Freq: Two times a day (BID) | SUBCUTANEOUS | Status: DC
  Administered 2020-07-04 – 2020-07-05 (×2): 15 [IU] via SUBCUTANEOUS
  Filled 2020-07-04 (×3): qty 0.15

## 2020-07-04 MED ORDER — AMLODIPINE BESYLATE 10 MG PO TABS
10.0000 mg | ORAL_TABLET | Freq: Every day | ORAL | Status: DC
  Administered 2020-07-05 – 2020-07-25 (×20): 10 mg
  Filled 2020-07-04 (×21): qty 1

## 2020-07-04 MED ORDER — PANTOPRAZOLE SODIUM 40 MG PO PACK
40.0000 mg | PACK | Freq: Every day | ORAL | Status: DC
  Administered 2020-07-04 – 2020-07-25 (×22): 40 mg
  Filled 2020-07-04 (×22): qty 20

## 2020-07-04 MED ORDER — INSULIN ASPART 100 UNIT/ML ~~LOC~~ SOLN
5.0000 [IU] | SUBCUTANEOUS | Status: DC
  Administered 2020-07-04 – 2020-07-10 (×34): 5 [IU] via SUBCUTANEOUS

## 2020-07-04 MED ORDER — LISINOPRIL 5 MG PO TABS: 5.0000 mg | ORAL_TABLET | Freq: Every day | ORAL | Status: DC

## 2020-07-04 MED ORDER — HYDROCHLOROTHIAZIDE 25 MG PO TABS
25.0000 mg | ORAL_TABLET | Freq: Once | ORAL | Status: AC
  Administered 2020-07-04: 25 mg
  Filled 2020-07-04: qty 1

## 2020-07-04 MED ORDER — AMLODIPINE BESYLATE 5 MG PO TABS
5.0000 mg | ORAL_TABLET | Freq: Once | ORAL | Status: AC
  Administered 2020-07-04: 5 mg
  Filled 2020-07-04: qty 1

## 2020-07-04 NOTE — Progress Notes (Signed)
NAME:  Harold Mckee, MRN:  [id number], DOB:  1946-11-23, LOS: 5 ADMISSION DATE:  06/29/2020, CONSULTATION DATE:  06/29/2020 REFERRING MD:  Dr. Jake Samples, CHIEF COMPLAINT:  Bilateral subarachnoid/subdural hematomas   Brief History    Harold Mckee is a 74yo make with PMX significant for HTN and Diabetes who presented after ground level fall resulting in head injury while walking his dog in the park. Per EMS patient was seen drinking alcohol prior to fall.   On arrival patient was seen bleeding from his left ear. Vitals on arrival significant for mild tachypnea and hypertension. Labwork significant for hyperglycemia and mild leukocytosis. CT C-spin and head positive for  left scull fracture and bilateral subarachnoid/subdural hematomas. PCCM consulted for assistance in admission.   Past Medical History  HTN  Diabetes   Significant Hospital Events   Admitted 7/13  7.15  - Started on empiric cefepime 7/14 due to fever and leukocytosis.  PCT 1.06 and lactate 5.9. Remains on cleviprex at 20. Mental status about the same, minimally responsive. CT head read pending.   Consults:  Neurosurgery   ENT 7/15 - Dr Suszanne Conners - >  Left longitudinal temporal bone fracture with possible separation of the malleus and incus. - Cotton ball packing of the left ear canal. Change cotton ball as needed.  The T-bone CT reviewed. No obvious otic capsule involvement.   No acute ENT intervention needed at this time. - The patient will need hearing test as an outpatient in my office. My need middle ear exploration if significant conductive hearing loss is noted.   Procedures:    Significant Diagnostic Tests:  CT C-spine and head 7/13 > 1. Nondisplaced fracture of the left parietal calvarium along the left lambdoid suture extending into the left occipital calvarium and left temporal bone with involvement of the left mastoid air cell and external auditory canal. Dedicated temporal bone CT is recommended for further  evaluation. 2. Bilateral subdural and subarachnoid hemorrhages. No midline shift. 3. Focal hemorrhage with small pockets of pneumocephalus in the left posterior temporal lobe along the calvarial fracture. This likely represents combination of subarachnoid and intraparenchymal hemorrhage. 4. No acute/traumatic cervical spine pathology. CT head 7/14 > blooming contusions in bilateral inferior frontal, bilateral, inferior temporal, and left parieto-occipital lobes.  Generalized increased in SAH, mild IVH without hydrocephalus.  Progressive scalp swelling over left calvarial fracture.  No pneumocephalus.  CT temporal bones 7/14 > longitudinal left temporal bone fx.  Hemotympanum  On left with pneuocephalus and soft tissue emphysema. EEG 7/14 > mod diffuse encephalopathy.  No seizures or epileptiform activity noted. Head CT 17 >> no change, extensive hemorrhagic contusions in the frontal lobes bilaterally, bifrontal subdural hematoma, diffuse subarachnoid hemorrhage, IVH without hydrocephalus  Micro Data:  COVID 7/13 > negative  Blood 7/14 > Respiratory 7/16 >> normal flora Blood 7/16 >>   Antimicrobials:  Cefepime 7/13 > Vanco 7/16 >>   Interim history/subjective:   No change in his wakefulness.  Precedex is now weaned to off Hyperglycemia, 250-300 overnight Currently on pressure support 5    Objective   Blood pressure (!) 147/73, pulse 87, temperature 100.1 F (37.8 C), temperature source Axillary, resp. rate 19, height 6\' 1"  (1.854 m), weight 71.2 kg, SpO2 100 %.    Vent Mode: PSV;CPAP FiO2 (%):  [30 %] 30 % Set Rate:  [16 bmp] 16 bmp Vt Set:  [630 mL] 630 mL PEEP:  [5 cmH20] 5 cmH20 Pressure Support:  [5 cmH20] 5 cmH20 Plateau Pressure:  [16  cmH20] 16 cmH20   Intake/Output Summary (Last 24 hours) at 07/04/2020 0948 Last data filed at 07/04/2020 0800 Gross per 24 hour  Intake 2425.05 ml  Output 2400 ml  Net 25.05 ml   Filed Weights   07/02/20 0500 07/03/20 0444 07/04/20  0300  Weight: 71 kg 72.2 kg 71.2 kg    Examination: General Appearance: Acute and chronically ill, ventilated Head: Normocephalic Eyes: Pupils 1 to 2 mm, sluggish Ears: Cotton in his left ear, no active bleeding noted Nose: NG tube in place Throat: Tube in place, oropharynx otherwise clear Neck: Normal Lungs: Clear bilaterally, no wheezing, no crackles, tolerating PSV Heart: Distant, no murmur Abdomen: Nondistended, positive bowel sounds Extremities: No edema Skin: No rash Neurologic: Remains completely unresponsive, no grimace today on stimulation or with pain    Assessment & Plan:   Acute Respiratory Failure, ventilator dependence, due to encephalopathy and inability to protect airway -Continues to tolerate PSV but does not have the mental status or airway protection currently to attempt extubation.  Continue PSV ad lib. but no plans to attempt extubation at this time -Plan to continue ventilator support pending resolution of his encephalopathy, okay for PSV ad lib. -Continue pulmonary hygiene -VAP prevention orders -Follow intermittent chest x-ray  Mechanical fall resulting in Left skull fracture and bilateral subarachnoid/subdural hematomas.  -Management as per neurosurgery recommendations -Currently on low-dose 3% saline (25 cc/h).  Sodium is up to 145.  Defer to neurosurgery as to sodium goal.  Continue to follow sodium   Left longitudinal temporal bone fracture with possible separation of the malleus and incus. -Appreciate Dr. Avel Sensor input and assistance.  No indication for surgery at this time but he will need follow-up, assessment of his hearing, possible middle ear exploration in the future depending on status  Possible seizure activity - EEG negative. P: -Following on Keppra -Plan to repeat EEG on 7/18  Hypertension Cleviprex off P: -Continue amlodipine, HCTZ -Continue hydralazine, labetalol as needed  Diabetes - diet controlled. P: -Sliding-scale  insulin -increase TF coverage to 5 units on 7/18 -increase Levemir to 15 units twice daily on 7/18  Sepsis - unclear etiology at this point.  No clear source, consider fever neurological P:  -Empiric cefepime, vancomycin added 7/16 -Continue to follow for another day on these antibiotics.  Check chest x-ray and procalcitonin 7/19, consider narrowing  Alcohol intoxication. - ETOH level 26 on arrival, unsure if this is acute vs chronic P: -No current evidence withdrawal (actually obtunded) -Plan to follow for any evidence of alcohol withdrawal once his mental status improves -Complete thiamine and folate supplementation  Persistent acute Encephalopathy due to all of above.  He has not shown any improvement in mental status with time, discontinuation sedation -Continue to follow mental status, CT stable, no evidence EEG abnormality.  Plan to repeat EEG on 7/18 -Continue empiric Keppra as ordered -Sedation off  Electrolyte imbalance -Follow BMP and replace electrolytes if indicated  Best practice:  Diet: NPO Pain/Anxiety/Delirium protocol (if indicated): PRN  VAP protocol (if indicated): N/A DVT prophylaxis: SCD GI prophylaxis: PPI Glucose control: SSI Mobility: Bedrest  Code Status: Full Family Communication: Per primary  Disposition: ICU    ATTESTATION & SIGNATURE   Independent critical care time 33 minutes  Levy Pupa, MD, PhD 07/04/2020, 9:48 AM [facility] [phone] or if no answer [phone]   LABS    PULMONARY Recent Labs  Lab 06/30/20 0414 06/30/20 1219 07/01/20 2055  PHART 7.284* 7.411 7.444  PCO2ART 23.4* 27.0*  31.6*  PO2ART 82* 72* 235*  HCO3 11.1* 16.8* 21.3  TCO2 12* 18* 22  O2SAT 95.0 93.0 100.0    CBC Recent Labs  Lab 07/02/20 0448 07/03/20 1311 07/04/20 0322  HGB 11.2* 10.2* 10.0*  HCT 32.9* 31.9* 31.8*  WBC 8.9 10.7* 11.8*  PLT 119* 161 153    COAGULATION Recent Labs  Lab 06/30/20 0024  INR 1.2     CARDIAC  No results for input(s): TROPONINI in the last 168 hours. No results for input(s): PROBNP in the last 168 hours.   CHEMISTRY Recent Labs  Lab 06/29/20 1944 06/29/20 1944 06/30/20 0024 06/30/20 0414 07/01/20 0655 07/01/20 1634 07/01/20 2055 07/02/20 0448 07/02/20 0448 07/02/20 1420 07/02/20 2221 07/03/20 1311 07/03/20 2234 07/04/20 0322  NA 135   < > 132*   < >  --   --    < > 133*   < > 134* 135 140 142 145  K 3.9   < > 3.6   < >  --   --    < > 3.5   < >  --   --  4.1  --  4.7  CL 96*  --  94*  --   --   --   --  100  --   --   --  109  --  112*  CO2 22  --  16*  --   --   --   --  19*  --   --   --  20*  --  21*  GLUCOSE 206*  --  326*  --   --   --   --  270*  --   --   --  279*  --  308*  BUN 11  --  10  --   --   --   --  20  --   --   --  20  --  24*  CREATININE 1.17  --  1.03  --   --   --   --  1.01  --   --   --  0.77  --  0.80  CALCIUM 9.5  --  9.4  --   --   --   --  8.9  --   --   --  9.4  --  9.6  MG  --   --  1.4*  --  2.0  --   --  1.8  --   --   --  2.1  --   --   PHOS  --   --  3.4   < > 2.3* 2.5  --  2.0*  --   --   --  2.0*  --  2.5   < > = values in this interval not displayed.   Estimated Creatinine Clearance: 80.3 mL/min (by C-G formula based on SCr of 0.8 mg/dL).   LIVER Recent Labs  Lab 06/30/20 0024 07/02/20 0448  AST  --  21  ALT  --  19  ALKPHOS  --  30*  BILITOT  --  0.8  PROT  --  6.4*  ALBUMIN  --  2.8*  INR 1.2  --      INFECTIOUS Recent Labs  Lab 06/30/20 1252 07/01/20 2221 07/02/20 0448  LATICACIDVEN 5.9* 3.8*  --   PROCALCITON 1.06  --  6.07     ENDOCRINE CBG (last 3)  Recent Labs    07/03/20 2302 07/04/20 0314 07/04/20  0751  GLUCAP 289* 260* 254*

## 2020-07-04 NOTE — Progress Notes (Signed)
Subjective: Patient intubated and sedated.   Objective: Vital signs in last 24 hours: Temp:  [99.4 F (37.4 C)-101.5 F (38.6 C)] 100.1 F (37.8 C) (07/18 0800) Pulse Rate:  [81-121] 87 (07/18 0800) Resp:  [16-28] 19 (07/18 0800) BP: (102-156)/(66-77) 147/73 (07/18 0800) SpO2:  [99 %-100 %] 100 % (07/18 0800) FiO2 (%):  [30 %] 30 % (07/18 0800) Weight:  [71.2 kg] 71.2 kg (07/18 0300)  Intake/Output from previous day: 07/17 0701 - 07/18 0700 In: 3207.9 [I.V.:815; NG/GT:1560; IV Piggyback:832.9] Out: 2400 [Urine:2400] Intake/Output this shift: Total I/O In: 283.4 [I.V.:31.4; NG/GT:252] Out: -   Minimal movement   Lab Results: Lab Results  Component Value Date   WBC 11.8 (H) 07/04/2020   HGB 10.0 (L) 07/04/2020   HCT 31.8 (L) 07/04/2020   MCV 89.1 07/04/2020   PLT 153 07/04/2020   Lab Results  Component Value Date   INR 1.2 06/30/2020   BMET Lab Results  Component Value Date   NA 145 07/04/2020   K 4.7 07/04/2020   CL 112 (H) 07/04/2020   CO2 21 (L) 07/04/2020   GLUCOSE 308 (H) 07/04/2020   BUN 24 (H) 07/04/2020   CREATININE 0.80 07/04/2020   CALCIUM 9.6 07/04/2020    Studies/Results: CT HEAD WO CONTRAST  Result Date: 07/03/2020 CLINICAL DATA:  Posttraumatic head injury. EXAM: CT HEAD WITHOUT CONTRAST TECHNIQUE: Contiguous axial images were obtained from the base of the skull through the vertex without intravenous contrast. COMPARISON:  CT head 07/01/2020 FINDINGS: Brain: Extensive hemorrhagic contusions in both frontal lobes with surrounding edema. 8 mm subdural hematoma anterior to the left frontal lobe. 4 mm subdural him hematoma anterior to the right frontal lobe. These findings are unchanged. Smaller hemorrhagic contusion left occipital parietal lobe unchanged. Hemorrhagic contusion left anterior temporal lobe unchanged. Small left parietal subdural hematoma unchanged. Small hemorrhagic contusion right temporal lobe. Diffuse subarachnoid hemorrhage unchanged.  There is blood layering in the occipital pole horns bilaterally unchanged. No hydrocephalus. No midline shift. Diffuse white matter hypodensity bilaterally is stable. Vascular: Negative for hyperdense vessel. Skull: Fracture through the left mastoid sinus and temporal bone unchanged. Diastasis left lambdoid suture. Sinuses/Orbits: Mucosal edema paranasal sinuses. Air-fluid level sphenoid sinus unchanged. NG tube in place. Other: None IMPRESSION: Stable CT without interval change Extensive hemorrhagic contusions in the frontal lobes bilaterally with bifrontal subdural hematomas. Diffuse subarachnoid hemorrhage. Intraventricular hemorrhage without hydrocephalus. Electronically Signed   By: Marlan Palau M.D.   On: 07/03/2020 08:46   DG CHEST PORT 1 VIEW  Result Date: 07/03/2020 CLINICAL DATA:  Intubated. EXAM: PORTABLE CHEST 1 VIEW COMPARISON:  07/02/2020 FINDINGS: The endotracheal tube tip is 2 cm above the carina. This could be retracted 4 cm. Normal sized heart. No significant change in patchy opacity at the left lung base. Interval minimal linear atelectasis at the right lung base. Unremarkable bones. IMPRESSION: 1. Endotracheal tube tip 2 cm above the carina. This could be retracted 4 cm. 2. Stable left basilar pneumonia or patchy atelectasis. 3. Interval minimal linear atelectasis at the right lung base. Electronically Signed   By: Beckie Salts M.D.   On: 07/03/2020 11:51    Assessment/Plan: 74 year old with bifrontal contusions, CHI with minimal movement. Stat ct head yesterday was stable. Nurse reports no change neurologically since yesterday.    LOS: 5 days    Harold Mckee 07/04/2020, 10:02 AM

## 2020-07-05 ENCOUNTER — Inpatient Hospital Stay (HOSPITAL_COMMUNITY): Payer: Medicare HMO

## 2020-07-05 DIAGNOSIS — R569 Unspecified convulsions: Secondary | ICD-10-CM | POA: Diagnosis not present

## 2020-07-05 DIAGNOSIS — S066X0D Traumatic subarachnoid hemorrhage without loss of consciousness, subsequent encounter: Secondary | ICD-10-CM | POA: Diagnosis not present

## 2020-07-05 DIAGNOSIS — S06361A Traumatic hemorrhage of cerebrum, unspecified, with loss of consciousness of 30 minutes or less, initial encounter: Secondary | ICD-10-CM | POA: Diagnosis not present

## 2020-07-05 LAB — CULTURE, BLOOD (ROUTINE X 2)
Culture: NO GROWTH
Culture: NO GROWTH
Special Requests: ADEQUATE

## 2020-07-05 LAB — BASIC METABOLIC PANEL
Anion gap: 12 (ref 5–15)
BUN: 30 mg/dL — ABNORMAL HIGH (ref 8–23)
CO2: 22 mmol/L (ref 22–32)
Calcium: 10.1 mg/dL (ref 8.9–10.3)
Chloride: 113 mmol/L — ABNORMAL HIGH (ref 98–111)
Creatinine, Ser: 1.01 mg/dL (ref 0.61–1.24)
GFR calc Af Amer: >60 mL/min (ref >60)
GFR calc non Af Amer: >60 mL/min (ref >60)
Glucose, Bld: 330 mg/dL — ABNORMAL HIGH (ref 70–99)
Potassium: 4.2 mmol/L (ref 3.5–5.1)
Sodium: 147 mmol/L — ABNORMAL HIGH (ref 135–145)

## 2020-07-05 LAB — CBC
HCT: 30.8 % — ABNORMAL LOW (ref 39.0–52.0)
Hemoglobin: 10 g/dL — ABNORMAL LOW (ref 13.0–17.0)
MCH: 28.1 pg (ref 26.0–34.0)
MCHC: 32.5 g/dL (ref 30.0–36.0)
MCV: 86.5 fL (ref 80.0–100.0)
Platelets: 211 10*3/uL (ref 150–400)
RBC: 3.56 MIL/uL — ABNORMAL LOW (ref 4.22–5.81)
RDW: 13.6 % (ref 11.5–15.5)
WBC: 13.7 10*3/uL — ABNORMAL HIGH (ref 4.0–10.5)
nRBC: 0.7 % — ABNORMAL HIGH (ref 0.0–0.2)

## 2020-07-05 LAB — GLUCOSE, CAPILLARY
Glucose-Capillary: 296 mg/dL — ABNORMAL HIGH (ref 70–99)
Glucose-Capillary: 279 mg/dL — ABNORMAL HIGH (ref 70–99)
Glucose-Capillary: 225 mg/dL — ABNORMAL HIGH (ref 70–99)
Glucose-Capillary: 276 mg/dL — ABNORMAL HIGH (ref 70–99)
Glucose-Capillary: 292 mg/dL — ABNORMAL HIGH (ref 70–99)
Glucose-Capillary: 248 mg/dL — ABNORMAL HIGH (ref 70–99)

## 2020-07-05 LAB — PROCALCITONIN: Procalcitonin: 1.75 ng/mL

## 2020-07-05 LAB — PHOSPHORUS: Phosphorus: 3.2 mg/dL (ref 2.5–4.6)

## 2020-07-05 MED ORDER — HYDROCODONE-ACETAMINOPHEN 5-325 MG PO TABS
1.0000 | ORAL_TABLET | ORAL | Status: DC | PRN
  Administered 2020-07-05 – 2020-07-07 (×5): 1
  Filled 2020-07-05 (×4): qty 1

## 2020-07-05 MED ORDER — INSULIN DETEMIR 100 UNIT/ML ~~LOC~~ SOLN
20.0000 [IU] | Freq: Two times a day (BID) | SUBCUTANEOUS | Status: DC
  Administered 2020-07-05 – 2020-07-06 (×3): 20 [IU] via SUBCUTANEOUS
  Filled 2020-07-05 (×5): qty 0.2

## 2020-07-05 MED ORDER — METOPROLOL TARTRATE 5 MG/5ML IV SOLN
2.5000 mg | INTRAVENOUS | Status: AC | PRN
  Administered 2020-07-05 – 2020-07-06 (×5): 2.5 mg via INTRAVENOUS
  Filled 2020-07-05 (×5): qty 5

## 2020-07-05 MED ORDER — LACTATED RINGERS IV BOLUS
500.0000 mL | Freq: Once | INTRAVENOUS | Status: AC
  Administered 2020-07-05: 500 mL via INTRAVENOUS

## 2020-07-05 MED ORDER — DEXMEDETOMIDINE HCL IN NACL 400 MCG/100ML IV SOLN
0.4000 ug/kg/h | INTRAVENOUS | Status: DC
  Administered 2020-07-06: 0.2 ug/kg/h via INTRAVENOUS
  Filled 2020-07-05: qty 100

## 2020-07-05 NOTE — Progress Notes (Signed)
NAME:  Harold Mckee, MRN:  [id number], DOB:  Jan 27, 1946, LOS: 6 ADMISSION DATE:  06/29/2020, CONSULTATION DATE:  06/29/2020 REFERRING MD:  Dr. Jake Samples, CHIEF COMPLAINT:  Bilateral subarachnoid/subdural hematomas   Brief History    Harold Mckee is a 74yo make with PMX significant for HTN and Diabetes who presented after ground level fall resulting in head injury while walking his dog in the park. Per EMS patient was seen drinking alcohol prior to fall.   On arrival patient was seen bleeding from his left ear. Vitals on arrival significant for mild tachypnea and hypertension. Labwork significant for hyperglycemia and mild leukocytosis. CT C-spin and head positive for  left scull fracture and bilateral subarachnoid/subdural hematomas. PCCM consulted for assistance in admission.   Past Medical History  HTN  Diabetes   Significant Hospital Events   Admitted 7/13  7.15  - Started on empiric cefepime 7/14 due to fever and leukocytosis.  PCT 1.06 and lactate 5.9. Remains on cleviprex at 20. Mental status about the same, minimally responsive. CT head read pending.   Consults:  Neurosurgery   ENT 7/15 - Dr Suszanne Conners - >  Left longitudinal temporal bone fracture with possible separation of the malleus and incus. - Cotton ball packing of the left ear canal. Change cotton ball as needed.  The T-bone CT reviewed. No obvious otic capsule involvement.   No acute ENT intervention needed at this time. - The patient will need hearing test as an outpatient in my office. My need middle ear exploration if significant conductive hearing loss is noted.   Procedures:    Significant Diagnostic Tests:  CT C-spine and head 7/13 > 1. Nondisplaced fracture of the left parietal calvarium along the left lambdoid suture extending into the left occipital calvarium and left temporal bone with involvement of the left mastoid air cell and external auditory canal. Dedicated temporal bone CT is recommended for further  evaluation. 2. Bilateral subdural and subarachnoid hemorrhages. No midline shift. 3. Focal hemorrhage with small pockets of pneumocephalus in the left posterior temporal lobe along the calvarial fracture. This likely represents combination of subarachnoid and intraparenchymal hemorrhage. 4. No acute/traumatic cervical spine pathology. CT head 7/14 > blooming contusions in bilateral inferior frontal, bilateral, inferior temporal, and left parieto-occipital lobes.  Generalized increased in SAH, mild IVH without hydrocephalus.  Progressive scalp swelling over left calvarial fracture.  No pneumocephalus.  CT temporal bones 7/14 > longitudinal left temporal bone fx.  Hemotympanum  On left with pneuocephalus and soft tissue emphysema.  EEG 7/14 > mod diffuse encephalopathy.  No seizures or epileptiform activity noted. Head CT 17 >> no change, extensive hemorrhagic contusions in the frontal lobes bilaterally, bifrontal subdural hematoma, diffuse subarachnoid hemorrhage, IVH without hydrocephalus  EEG 7/19 >> Moderate to severe encephalopathy, nonspecific.  No seizures or epileptiform discharges seen  Micro Data:  COVID 7/13 > negative  Blood 7/14 > Respiratory 7/16 >> normal flora Blood 7/16 >>   Antimicrobials:  Cefepime 7/13 > Vanco 7/16 >>   Interim history/subjective:   Tolerating PSV 5 No change in his wakefulness except for some eye-opening Note evolving sinus tachycardia and some generalized tremor EEG as above   Objective   Blood pressure 100/86, pulse (!) 128, temperature 99.7 F (37.6 C), temperature source Axillary, resp. rate (!) 31, height 6\' 1"  (1.854 m), weight 71.2 kg, SpO2 96 %.    Vent Mode: PSV;CPAP FiO2 (%):  [30 %] 30 % Set Rate:  [16 bmp] 16 bmp Vt Set:   mL] 630 mL PEEP:  [5 cmH20] 5 cmH20 Pressure Support:  [5 cmH20] 5 cmH20 Plateau Pressure:  [16 cmH20-18 cmH20] 16 cmH20   Intake/Output Summary (Last 24 hours) at 07/05/2020 1015 Last data filed at  07/05/2020 0900 Gross per 24 hour  Intake 2294.82 ml  Output 2800 ml  Net -505.18 ml   Filed Weights   07/03/20 0444 07/04/20 0300 07/05/20 0500  Weight: 72.2 kg 71.2 kg 71.2 kg    Examination: General Appearance: Acute and chronically ill, ventilated Head: Normocephalic Eyes: Pupils 2 mm, nonreactive Ears: Cotton in his left ear, no active bleeding Nose: NG tube in place Throat: ET tube in place, oropharynx otherwise clear Neck: Normal Lungs: Good air movement, no wheezing, no crackles, tolerating PSV currently Heart: Distant, regular, tachycardic 130 Abdomen: Nondistended, positive bowel sounds Extremities: No edema Skin: No rash Neurologic: Eyes are spontaneously open today, does not track, does not respond to pain, voice, any stimulation.  He has a generalized tremor upper and lower extremities which is new    Assessment & Plan:   Acute Respiratory Failure, ventilator dependence, due to encephalopathy and inability to protect airway -Ventilator dependence due to his encephalopathy.  He can tolerate PSV but clearly does not have the mental status to protect his airway.  Okay to continue PSV ad lib. -Pulmonary hygiene -VAP prevention order set -Follow intermittent chest x-ray  Mechanical fall resulting in Left skull fracture and bilateral subarachnoid/subdural hematomas.  -3% saline was stopped overnight with sodium at 145.  Unclear whether sodium goal is higher, will defer to neurosurgery for this. -Any repeat imaging as per neurosurgery plans  Persistent acute Encephalopathy.  He has not shown any improvement in mental status with time, discontinuation sedation Generalized tremor.  No evidence of seizure activity EEG 7/19 -Continue to follow mental status, unclear prognosis or chance for wake-up given his described injuries.  No evidence for progression on most recent CT, no evidence for active seizures on EEG today -Continue empiric Keppra as ordered -Attempting to  minimize sedation although now experiencing tremor, tachycardia.  May need to add back low-dose Precedex  Left longitudinal temporal bone fracture with possible separation of the malleus and incus. -Appreciate Dr. Avel Sensoreoh's input and assistance.  No indication for surgery at this time but he will need follow-up, assessment of his hearing, possible middle ear exploration in the future depending on status  Possible seizure activity - EEG negative 7/14, 7/19. P: -Following on Keppra -Repeat EEG as above 7/19  Hypertension Cleviprex off P: -Continue amlodipine, HCTZ -Scheduled metoprolol added on 7/18 (home medication) -Hydralazine, labetalol available as needed  Diabetes - diet controlled. P: -Continue resistant sliding scale -increased TF coverage to 5 units on 7/18 -increase Levemir to 20 units twice daily on 7/19  Sepsis - unclear etiology at this point.  No clear source, consider fever neurological P:  -Chest x-ray 7/19 with proved bibasilar opacities, atelectasis versus infiltrate.  Leukocytosis 7/19 -Continue vancomycin, cefepime for 1 more day, plan to discontinue after dosing on 7/20  Alcohol intoxication. - ETOH level 26 on arrival, unsure if this is acute vs chronic P: -No current evidence withdrawal (actually obtunded) -We will plan to follow for any evidence of alcohol withdrawal once his mental status improves -Completed thiamine and folate supplementation  Electrolyte imbalance -Continue to follow BMP and replace electrolytes if indicated  Best practice:  Diet: NPO Pain/Anxiety/Delirium protocol (if indicated): PRN  VAP protocol (if indicated): N/A DVT prophylaxis: SCD GI prophylaxis: PPI Glucose control: SSI  Mobility: Bedrest  Code Status: Full Family Communication: Per primary service Disposition: ICU    ATTESTATION & SIGNATURE   Independent critical care time 32 minutes  Levy Pupa, MD, PhD 07/05/2020, 10:15 AM [facility] [phone] or if no answer [phone]   LABS    PULMONARY Recent Labs  Lab 06/30/20 0414 06/30/20 1219 07/01/20 2055  PHART 7.284* 7.411 7.444  PCO2ART 23.4* 27.0* 31.6*  PO2ART 82* 72* 235*  HCO3 11.1* 16.8* 21.3  TCO2 12* 18* 22  O2SAT 95.0 93.0 100.0    CBC Recent Labs  Lab 07/03/20 1311 07/04/20 0322 07/05/20 0805  HGB 10.2* 10.0* 10.0*  HCT 31.9* 31.8* 30.8*  WBC 10.7* 11.8* 13.7*  PLT 161 153 211    COAGULATION Recent Labs  Lab 06/30/20 0024  INR 1.2    CARDIAC  No results for input(s): TROPONINI in the last 168 hours. No results for input(s): PROBNP in the last 168 hours.   CHEMISTRY Recent Labs  Lab 06/30/20 0024 06/30/20 0414 07/01/20 0655 07/01/20 0655 07/01/20 1634 07/01/20 2055 07/02/20 0448 07/02/20 1420 07/03/20 1311 07/03/20 1311 07/03/20 2234 07/04/20 0322 07/04/20 1453 07/04/20 2232 07/05/20 0805  NA 132*   < >  --   --   --    < > 133*   < > 140   < > 142 145 144 145 147*  K 3.6   < >  --   --   --    < > 3.5  --  4.1   < >  --  4.7  --   --  4.2  CL 94*  --   --   --   --   --  100  --  109  --   --  112*  --   --  113*  CO2 16*  --   --   --   --   --  19*  --  20*  --   --  21*  --   --  22  GLUCOSE 326*  --   --   --   --   --  270*  --  279*  --   --  308*  --   --  330*  BUN 10  --   --   --   --   --  20  --  20  --   --  24*  --   --  30*  CREATININE 1.03  --   --   --   --   --  1.01  --  0.77  --   --  0.80  --   --  1.01  CALCIUM 9.4  --   --   --   --   --  8.9  --  9.4  --   --  9.6  --   --  10.1  MG 1.4*  --  2.0  --   --   --  1.8  --  2.1  --   --   --   --   --   --   PHOS 3.4   < > 2.3*   < > 2.5  --  2.0*  --  2.0*  --   --  2.5  --   --  3.2   < > = values in this interval not displayed.   Estimated Creatinine Clearance: 63.6 mL/min (by C-G formula based on SCr of 1.01  mg/dL).   LIVER Recent Labs  Lab 06/30/20 0024 07/02/20 0448  AST  --  21  ALT  --  19  ALKPHOS  --  30*  BILITOT  --   0.8  PROT  --  6.4*  ALBUMIN  --  2.8*  INR 1.2  --      INFECTIOUS Recent Labs  Lab 06/30/20 1252 07/01/20 2221 07/02/20 0448  LATICACIDVEN 5.9* 3.8*  --   PROCALCITON 1.06  --  6.07     ENDOCRINE CBG (last 3)  Recent Labs    07/04/20 2327 07/05/20 0318 07/05/20 0740  GLUCAP 348* 296* 279*

## 2020-07-05 NOTE — Procedures (Signed)
Patient Name: Sampson Self  MRN: [id number]  Epilepsy Attending: Charlsie Quest  Referring Physician/Provider: DR Levy Pupa Date: 7/19/201 Duration: 25.04 mins  Patient history: 74yo M with SAH and seizure like activity, continues to be altered. EEG to evaluate for seizure.   Level of alertness: Awake/ lethargic  AEDs during EEG study: LEV  Technical aspects: This EEG study was done with scalp electrodes positioned according to the 10-20 International system of electrode placement. Electrical activity was acquired at a sampling rate of 500Hz  and reviewed with a high frequency filter of 70Hz  and a low frequency filter of 1Hz . EEG data were recorded continuously and digitally stored.   Description: No posterior dominant rhythm was seen. EEG showed continuous generalized 3 to 6 Hz theta-delta slowing. Hyperventilation and photic stimulation were not performed.     EEG was technically difficult due to significant myogenic artifact.   ABNORMALITY -Continuous slow, generalized  IMPRESSION: This study is suggestive of moderate to severe diffuse encephalopathy, nonspecific etiology. No seizures or epileptiform discharges were seen throughout the recording.  Tashunda Vandezande 

## 2020-07-05 NOTE — Progress Notes (Signed)
EEG complete - results pending 

## 2020-07-05 NOTE — Progress Notes (Signed)
Subjective: Intubated, sedated, no issues per RN. Much more alert today  Objective: PE: Intubated, sedated Eyesopen to voice Midline gaze Pupils equally round reactive to light Face symmetric, winces to pain Localizing BUE, WD to pain in LEs Dry dressing over L ear   Assessment/Plan: 1. Left occipital parietotemporal calvarial fracture secondary to fall 2.Left temporal contusion 3.Bilateral frontal traumatic subarachnoid hemorrhage & contusions/subdural hematomas with cerebral edema 4.TBI 5. Hyponatremia, resolved   -neuro ICU -Every hour neurochecks -Keppra 1000 mg twice daily (EEG neg) -N.p.o., coretrak feeds -Blood pressure control -HOLD ALL ANTICOAG -No acute surgical intervention at this time -DC hypertonic saline -monitor closely -ccm planned extubation tomorrow   Monia Pouch, DO Neurosurgeon [facility] Cell: [phone]

## 2020-07-05 NOTE — Progress Notes (Signed)
Pharmacy Antibiotic Note  Harold Mckee is a 74 y.o. male admitted on 06/29/2020.  Pt continues on empiric cefepime and vancomycin for fevers of unknown origin. WBC 13.7, PCT 1.75, SCr 1.01, Tm 102F.   All cultures remain negative to date   Plan: Continue cefepime 2 gm IV Q 8 hours  Continue vancomycin 750mg  IV Q12H F/u renal fxn, C&S, clinical status and trough at SS  Height: 6\' 1"  (185.4 cm) Weight: 71.2 kg (156 lb 15.5 oz) IBW/kg (Calculated) : 79.9  Temp (24hrs), Avg:100.4 F (38 C), Min:99.5 F (37.5 C), Max:102 F (38.9 C)  Recent Labs  Lab 06/30/20 0024 06/30/20 0024 06/30/20 1252 07/01/20 0449 07/01/20 2221 07/02/20 0448 07/03/20 1311 07/04/20 0322 07/05/20 0805  WBC 18.8*   < >  --  19.0*  --  8.9 10.7* 11.8* 13.7*  CREATININE 1.03  --   --   --   --  1.01 0.77 0.80 1.01  LATICACIDVEN  --   --  5.9*  --  3.8*  --   --   --   --    < > = values in this interval not displayed.    Estimated Creatinine Clearance: 63.6 mL/min (by C-G formula based on SCr of 1.01 mg/dL).    No Known Allergies  Antimicrobials this admission: Vanc 7/16>> Cefepime 7/ 14 >>   Dose adjustments this admission: N/A  Microbiology results: MRSA PCR: neg  7/16 TA: rare GNR on gram stain - nml flora 7/16 BCx: ngtd 7/14 BCx: ngtd  Thank you for allowing pharmacy to be a part of this patient's care.  8/16, PharmD., BCPS, BCCCP Clinical Pharmacist Clinical phone for 07/05/20 until 3:30pm: [phone] If after 3:30pm, please refer to [facility] for unit-specific pharmacist

## 2020-07-05 NOTE — Progress Notes (Addendum)
Inpatient Diabetes Program Recommendations  AACE/ADA: New Consensus Statement on Inpatient Glycemic Control (2015)  Target Ranges:  Prepandial:   less than 140 mg/dL      Peak postprandial:   less than 180 mg/dL (1-2 hours)      Critically ill patients:  140 - 180 mg/dL   Lab Results  Component Value Date   GLUCAP 225 (H) 07/05/2020   HGBA1C 8.5 (H) 06/30/2020    Review of Glycemic Control Results for Harold Mckee, Harold Mckee (MRN [id number]) as of 07/05/2020 12:38  Ref. Range 07/04/2020 19:43 07/04/2020 23:27 07/05/2020 03:18 07/05/2020 07:40 07/05/2020 11:17  Glucose-Capillary Latest Ref Range: 70 - 99 mg/dL 179 (H) 150 (H) 569 (H) 279 (H) 225 (H)    Diabetes history: Type 2 DM Outpatient Diabetes medications: Ozempic 0.5 mg qwk, Metformin 1000 mg BID Current orders for Inpatient glycemic control: Novolog 0-20 units Q4H, Novolog 5 units Q4H,  levemir 15 units bid Osmolite 65 ml/hr  Inpatient Diabetes Program Recommendations:    Patient has received a total of 112 units of Novolog for correction and meal coverage in 24 hrs.  CBG's remain 200-300's.  Lantus was increased last evening to 15 units bid.  Please consider Levemir 20 units bid     Will continue to follow while inpatient.  Thank you, Dulce Sellar, RN, BSN Diabetes Coordinator Inpatient Diabetes Program [phone] (team pager from 8a-5p)

## 2020-07-05 NOTE — Progress Notes (Signed)
eLink Physician-Brief Progress Note Patient Name: Harold Mckee DOB: 07-May-1946 MRN: [id number]   Date of Service  07/05/2020  HPI/Events of Note  Notified of HR 130s. Low grade temp. UO > 1 liter/shift. Receiving HCTZ.  eICU Interventions  Ordered a trial of 500 cc LR bolus and monitor HR. May need to hold HCTZ     Intervention Category Major Interventions: Other:  Darl Pikes 07/05/2020, 2:12 AM

## 2020-07-06 ENCOUNTER — Other Ambulatory Visit: Payer: Self-pay

## 2020-07-06 ENCOUNTER — Inpatient Hospital Stay (HOSPITAL_COMMUNITY): Payer: Medicare HMO

## 2020-07-06 DIAGNOSIS — S066X1D Traumatic subarachnoid hemorrhage with loss of consciousness of 30 minutes or less, subsequent encounter: Secondary | ICD-10-CM

## 2020-07-06 LAB — BASIC METABOLIC PANEL
Anion gap: 10 (ref 5–15)
BUN: 33 mg/dL — ABNORMAL HIGH (ref 8–23)
CO2: 23 mmol/L (ref 22–32)
Calcium: 10 mg/dL (ref 8.9–10.3)
Chloride: 113 mmol/L — ABNORMAL HIGH (ref 98–111)
Creatinine, Ser: 1.06 mg/dL (ref 0.61–1.24)
GFR calc Af Amer: >60 mL/min (ref >60)
GFR calc non Af Amer: >60 mL/min (ref >60)
Glucose, Bld: 268 mg/dL — ABNORMAL HIGH (ref 70–99)
Potassium: 4.1 mmol/L (ref 3.5–5.1)
Sodium: 146 mmol/L — ABNORMAL HIGH (ref 135–145)

## 2020-07-06 LAB — GLUCOSE, CAPILLARY
Glucose-Capillary: 255 mg/dL — ABNORMAL HIGH (ref 70–99)
Glucose-Capillary: 263 mg/dL — ABNORMAL HIGH (ref 70–99)
Glucose-Capillary: 245 mg/dL — ABNORMAL HIGH (ref 70–99)
Glucose-Capillary: 271 mg/dL — ABNORMAL HIGH (ref 70–99)
Glucose-Capillary: 291 mg/dL — ABNORMAL HIGH (ref 70–99)
Glucose-Capillary: 297 mg/dL — ABNORMAL HIGH (ref 70–99)

## 2020-07-06 LAB — CBC
HCT: 31.2 % — ABNORMAL LOW (ref 39.0–52.0)
Hemoglobin: 10.1 g/dL — ABNORMAL LOW (ref 13.0–17.0)
MCH: 28.3 pg (ref 26.0–34.0)
MCHC: 32.4 g/dL (ref 30.0–36.0)
MCV: 87.4 fL (ref 80.0–100.0)
Platelets: 217 10*3/uL (ref 150–400)
RBC: 3.57 MIL/uL — ABNORMAL LOW (ref 4.22–5.81)
RDW: 13.9 % (ref 11.5–15.5)
WBC: 11.5 10*3/uL — ABNORMAL HIGH (ref 4.0–10.5)
nRBC: 1.2 % — ABNORMAL HIGH (ref 0.0–0.2)

## 2020-07-06 LAB — PROCALCITONIN: Procalcitonin: 1.6 ng/mL

## 2020-07-06 MED ORDER — METOPROLOL TARTRATE 5 MG/5ML IV SOLN
2.5000 mg | Freq: Two times a day (BID) | INTRAVENOUS | Status: AC
  Administered 2020-07-06 (×2): 2.5 mg via INTRAVENOUS
  Filled 2020-07-06 (×2): qty 5

## 2020-07-06 MED ORDER — POLYETHYLENE GLYCOL 3350 17 G PO PACK
17.0000 g | PACK | Freq: Two times a day (BID) | ORAL | Status: DC
  Administered 2020-07-06 – 2020-07-24 (×14): 17 g
  Filled 2020-07-06 (×15): qty 1

## 2020-07-06 MED ORDER — SENNOSIDES 8.8 MG/5ML PO SYRP
5.0000 mL | ORAL_SOLUTION | Freq: Two times a day (BID) | ORAL | Status: DC
  Administered 2020-07-06 – 2020-07-24 (×18): 5 mL
  Filled 2020-07-06 (×18): qty 5

## 2020-07-06 NOTE — Progress Notes (Signed)
NAME:  Harold Mckee, MRN:  [id number], DOB:  1946-03-17, LOS: 7 ADMISSION DATE:  06/29/2020, CONSULTATION DATE:  06/29/2020 REFERRING MD:  Dr. Jake Samplesawley, CHIEF COMPLAINT:  Bilateral subarachnoid/subdural hematomas   Brief History    Harold Mckee is a 74yo make with PMX significant for HTN and Diabetes who presented after ground level fall resulting in head injury while walking his dog in the park. Per EMS patient was seen drinking alcohol prior to fall.   On arrival patient was seen bleeding from his left ear. Vitals on arrival significant for mild tachypnea and hypertension. Labwork significant for hyperglycemia and mild leukocytosis. CT C-spin and head positive for  left scull fracture and bilateral subarachnoid/subdural hematomas. PCCM consulted for assistance in admission.   Past Medical History  HTN  Diabetes   Significant Hospital Events   Admitted 7/13  7.15  - Started on empiric cefepime 7/14 due to fever and leukocytosis.  PCT 1.06 and lactate 5.9. Remains on cleviprex at 20. Mental status about the same, minimally responsive. CT head read pending.   Consults:  Neurosurgery   ENT 7/15 - Dr Suszanne Connerseoh - >  Left longitudinal temporal bone fracture with possible separation of the malleus and incus. - Cotton ball packing of the left ear canal. Change cotton ball as needed.  The T-bone CT reviewed. No obvious otic capsule involvement.   No acute ENT intervention needed at this time. - The patient will need hearing test as an outpatient in my office. My need middle ear exploration if significant conductive hearing loss is noted.   Procedures:    Significant Diagnostic Tests:  CT C-spine and head 7/13 > 1. Nondisplaced fracture of the left parietal calvarium along the left lambdoid suture extending into the left occipital calvarium and left temporal bone with involvement of the left mastoid air cell and external auditory canal. Dedicated temporal bone CT is recommended for further  evaluation. 2. Bilateral subdural and subarachnoid hemorrhages. No midline shift. 3. Focal hemorrhage with small pockets of pneumocephalus in the left posterior temporal lobe along the calvarial fracture. This likely represents combination of subarachnoid and intraparenchymal hemorrhage. 4. No acute/traumatic cervical spine pathology. CT head 7/14 > blooming contusions in bilateral inferior frontal, bilateral, inferior temporal, and left parieto-occipital lobes.  Generalized increased in SAH, mild IVH without hydrocephalus.  Progressive scalp swelling over left calvarial fracture.  No pneumocephalus.  CT temporal bones 7/14 > longitudinal left temporal bone fx.  Hemotympanum  On left with pneuocephalus and soft tissue emphysema.  EEG 7/14 > mod diffuse encephalopathy.  No seizures or epileptiform activity noted. Head CT 17 >> no change, extensive hemorrhagic contusions in the frontal lobes bilaterally, bifrontal subdural hematoma, diffuse subarachnoid hemorrhage, IVH without hydrocephalus  EEG 7/19 >> Moderate to severe encephalopathy, nonspecific.  No seizures or epileptiform discharges seen  Micro Data:  COVID 7/13 > negative  Blood 7/14 > Respiratory 7/16 >> normal flora Blood 7/16 >>   Antimicrobials:  Cefepime 7/13 > Vanco 7/16 >>   Interim history/subjective:   Continues to tolerate pressure support Low-dose Precedex restarted on 7/19 for tachycardia, tremor No change in his mental status reported overnight   Objective   Blood pressure 133/77, pulse (!) 117, temperature (!) 100.6 F (38.1 C), temperature source Axillary, resp. rate (!) 23, height 6\' 1"  (1.854 m), weight 70 kg, SpO2 100 %.    Vent Mode: PRVC FiO2 (%):  [30 %] 30 % Set Rate:  [16 bmp] 16 bmp Vt Set:  [409[630 mL]  630 mL PEEP:  [5 cmH20] 5 cmH20 Pressure Support:  [5 cmH20] 5 cmH20 Plateau Pressure:  [19 cmH20-23 cmH20] 19 cmH20   Intake/Output Summary (Last 24 hours) at 07/06/2020 0654 Last data filed at  07/06/2020 0500 Gross per 24 hour  Intake 2957.86 ml  Output 2450 ml  Net 507.86 ml   Filed Weights   07/04/20 0300 07/05/20 0500 07/06/20 0500  Weight: 71.2 kg 71.2 kg 70 kg    Examination: General Appearance: Chronically ill, ventilated Head: Normocephalic Eyes: Pupils reactive to light Ears: Cotton left ear, no active bleeding Nose: NG tube in place Throat: ET tube in place Neck: Normal Lungs: Good air movement, no wheeze, no crackles currently on PRVC Heart: Distant, regular, heart rate 109 Abdomen: Nondistended, positive bowel sounds Extremities: No edema Skin: No rash Neurologic: Does not open eyes today to voice or pain.  Pupils are midline.  Does not track or follow commands, no purposeful movement.  Strong cough.  Tremor has resolved   Assessment & Plan:   Acute Respiratory Failure, ventilator dependence, due to encephalopathy and inability to protect airway Ventilator dependent due to his encephalopathy.  He tolerates pressure support but does not have the mental status to protect his airway.  Unclear whether he will be able to be extubated, mental status will be the determining factor. Pulmonary hygiene VAP prevention order set Follow intermittent chest x-ray  Mechanical fall resulting in Left skull fracture and bilateral subarachnoid/subdural hematomas.  Now off 3% saline.  No plans to restart unless recommended by neurosurgery Any repeat imaging as per neurosurgery plans  Persistent acute Encephalopathy.  He has not shown any improvement in mental status with time, discontinuation sedation Generalized tremor.  No evidence of seizure activity EEG 7/19 Unfortunately no significant change.  On 7/19 he did open eyes spontaneously, did not do so today.  Prognosis unclear.  Defer to neurosurgery.,  Question involve neurology as well for prognostication.  He is not in a position to extubate. Continue empiric Keppra as ordered Minimize sedating medications.  He was  started back on Precedex 7/19 given his tremor, tachycardia.  Would like to have him off this if possible  Left longitudinal temporal bone fracture with possible separation of the malleus and incus. -Appreciate Dr. Avel Sensor input and assistance.  No indication for surgery at this time but he will need follow-up, assessment of his hearing, possible middle ear exploration in the future depending on status  Possible seizure activity - EEG negative 7/14, 7/19. P: Following on Keppra Serial EEG have not shown any active seizures  Hypertension Cleviprex off P: Amlodipine, HCTZ, metoprolol Hydralazine and labetalol available as needed  Diabetes - diet controlled. P: Resistant sliding scale Tube feed coverage 5 units, Levemir 20 units twice daily  Sepsis - unclear etiology at this point.  No clear source, consider fever neurological P:  Source unclear.  He does have bibasilar atelectasis versus infiltrate on chest x-ray with leukocytosis. Continue vancomycin and cefepime through 7/20 and discontinue.  Follow clinically off antibiotics at that time  Alcohol intoxication. - ETOH level 26 on arrival, unsure if this is acute vs chronic P: If he wakes up then we will have to watch for any evidence of alcohol withdrawal Completed thiamine and folate supplementation  Electrolyte imbalance Follow BMP, replace electrolytes if indicated  Best practice:  Diet: NPO Pain/Anxiety/Delirium protocol (if indicated): PRN  VAP protocol (if indicated): N/A DVT prophylaxis: SCD GI prophylaxis: PPI Glucose control: SSI Mobility: Bedrest  Code Status: Full  Family Communication: Per primary service Disposition: ICU    ATTESTATION & SIGNATURE   Independent critical care time 31 minutes  Levy Pupa, MD, PhD 07/06/2020, 6:54 AM [facility] [phone] or if no answer [phone]   LABS    PULMONARY Recent Labs  Lab 06/30/20 0414 06/30/20 1219 07/01/20 2055    PHART 7.284* 7.411 7.444  PCO2ART 23.4* 27.0* 31.6*  PO2ART 82* 72* 235*  HCO3 11.1* 16.8* 21.3  TCO2 12* 18* 22  O2SAT 95.0 93.0 100.0    CBC Recent Labs  Lab 07/03/20 1311 07/04/20 0322 07/05/20 0805  HGB 10.2* 10.0* 10.0*  HCT 31.9* 31.8* 30.8*  WBC 10.7* 11.8* 13.7*  PLT 161 153 211    COAGULATION Recent Labs  Lab 06/30/20 0024  INR 1.2    CARDIAC  No results for input(s): TROPONINI in the last 168 hours. No results for input(s): PROBNP in the last 168 hours.   CHEMISTRY Recent Labs  Lab 06/30/20 0024 06/30/20 0414 07/01/20 0655 07/01/20 0655 07/01/20 1634 07/01/20 2055 07/02/20 0448 07/02/20 1420 07/03/20 1311 07/03/20 1311 07/03/20 2234 07/04/20 0322 07/04/20 1453 07/04/20 2232 07/05/20 0805  NA 132*   < >  --   --   --    < > 133*   < > 140   < > 142 145 144 145 147*  K 3.6   < >  --   --   --    < > 3.5  --  4.1   < >  --  4.7  --   --  4.2  CL 94*  --   --   --   --   --  100  --  109  --   --  112*  --   --  113*  CO2 16*  --   --   --   --   --  19*  --  20*  --   --  21*  --   --  22  GLUCOSE 326*  --   --   --   --   --  270*  --  279*  --   --  308*  --   --  330*  BUN 10  --   --   --   --   --  20  --  20  --   --  24*  --   --  30*  CREATININE 1.03  --   --   --   --   --  1.01  --  0.77  --   --  0.80  --   --  1.01  CALCIUM 9.4  --   --   --   --   --  8.9  --  9.4  --   --  9.6  --   --  10.1  MG 1.4*  --  2.0  --   --   --  1.8  --  2.1  --   --   --   --   --   --   PHOS 3.4   < > 2.3*   < > 2.5  --  2.0*  --  2.0*  --   --  2.5  --   --  3.2   < > = values in this interval not displayed.   Estimated Creatinine Clearance: 62.6 mL/min (by C-G formula based on SCr of 1.01 mg/dL).   LIVER Recent  Labs  Lab 06/30/20 0024 07/02/20 0448  AST  --  21  ALT  --  19  ALKPHOS  --  30*  BILITOT  --  0.8  PROT  --  6.4*  ALBUMIN  --  2.8*  INR 1.2  --      INFECTIOUS Recent Labs  Lab 06/30/20 1252 07/01/20 2221  07/02/20 0448 07/05/20 0805  LATICACIDVEN 5.9* 3.8*  --   --   PROCALCITON 1.06  --  6.07 1.75     ENDOCRINE CBG (last 3)  Recent Labs    07/05/20 1921 07/05/20 2323 07/06/20 0321  GLUCAP 292* 248* 255*

## 2020-07-06 NOTE — Progress Notes (Signed)
Subjective: Intubated, sedation stopped this am, less responsive than yesterday  Objective: PE: Intubated Eyesclosed to pain Leftward gaze Pupils equally round reactive to light, brisk Face symmetric, winces to pain No motor response BUE, WD to pain in LEs   Assessment/Plan: 1. Left occipital parietotemporal calvarial fracture secondary to fall 2.Left temporal contusion 3.Bilateral frontal traumatic subarachnoidhemorrhage & contusions/subdural hematomas with cerebral edema 4.TBI 5. Hyponatremia, resolved   -neuro ICU -Every hour neurochecks -Keppra 1000 mg twice daily(EEG neg x2) -N.p.o., coretrak feeds -Blood pressure control -HOLD ALL ANTICOAG -stat CT brain ordered as patient is less responsive this am although he had a stable CT brain 7/17, will followup closely  Monia Pouch, DO Neurosurgeon [facility] Cell: [phone]

## 2020-07-06 NOTE — Progress Notes (Signed)
Pt transported to and from CT via the ventilator. Pt tolerated well and no complications noted. Pt now back in room 4N18. RT will continue to monitor.

## 2020-07-06 NOTE — Progress Notes (Signed)
CT brain reviewed 07/06/20, no acute changes, d/w RN to minimize sedation as needed  Will follow closely    Monia Pouch, DO Neurosurgeon [facility] Cell: [phone]

## 2020-07-07 DIAGNOSIS — S06361A Traumatic hemorrhage of cerebrum, unspecified, with loss of consciousness of 30 minutes or less, initial encounter: Secondary | ICD-10-CM

## 2020-07-07 LAB — CBC
HCT: 32.6 % — ABNORMAL LOW (ref 39.0–52.0)
HCT: 31.3 % — ABNORMAL LOW (ref 39.0–52.0)
Hemoglobin: 10.4 g/dL — ABNORMAL LOW (ref 13.0–17.0)
Hemoglobin: 9.9 g/dL — ABNORMAL LOW (ref 13.0–17.0)
MCH: 27.7 pg (ref 26.0–34.0)
MCH: 27.6 pg (ref 26.0–34.0)
MCHC: 31.9 g/dL (ref 30.0–36.0)
MCHC: 31.6 g/dL (ref 30.0–36.0)
MCV: 86.9 fL (ref 80.0–100.0)
MCV: 87.2 fL (ref 80.0–100.0)
Platelets: 230 10*3/uL (ref 150–400)
Platelets: 248 10*3/uL (ref 150–400)
RBC: 3.75 MIL/uL — ABNORMAL LOW (ref 4.22–5.81)
RBC: 3.59 MIL/uL — ABNORMAL LOW (ref 4.22–5.81)
RDW: 13.9 % (ref 11.5–15.5)
RDW: 14.1 % (ref 11.5–15.5)
WBC: 11.7 10*3/uL — ABNORMAL HIGH (ref 4.0–10.5)
WBC: 11.6 10*3/uL — ABNORMAL HIGH (ref 4.0–10.5)
nRBC: 1.6 % — ABNORMAL HIGH (ref 0.0–0.2)
nRBC: 2 % — ABNORMAL HIGH (ref 0.0–0.2)

## 2020-07-07 LAB — CULTURE, BLOOD (ROUTINE X 2)
Culture: NO GROWTH
Culture: NO GROWTH

## 2020-07-07 LAB — BASIC METABOLIC PANEL
Anion gap: 13 (ref 5–15)
BUN: 32 mg/dL — ABNORMAL HIGH (ref 8–23)
CO2: 24 mmol/L (ref 22–32)
Calcium: 10.1 mg/dL (ref 8.9–10.3)
Chloride: 109 mmol/L (ref 98–111)
Creatinine, Ser: 1 mg/dL (ref 0.61–1.24)
GFR calc Af Amer: >60 mL/min (ref >60)
GFR calc non Af Amer: >60 mL/min (ref >60)
Glucose, Bld: 308 mg/dL — ABNORMAL HIGH (ref 70–99)
Potassium: 4.5 mmol/L (ref 3.5–5.1)
Sodium: 146 mmol/L — ABNORMAL HIGH (ref 135–145)

## 2020-07-07 LAB — CREATININE, SERUM
Creatinine, Ser: 1.14 mg/dL (ref 0.61–1.24)
GFR calc Af Amer: >60 mL/min (ref >60)
GFR calc non Af Amer: >60 mL/min (ref >60)

## 2020-07-07 LAB — GLUCOSE, CAPILLARY
Glucose-Capillary: 274 mg/dL — ABNORMAL HIGH (ref 70–99)
Glucose-Capillary: 293 mg/dL — ABNORMAL HIGH (ref 70–99)
Glucose-Capillary: 281 mg/dL — ABNORMAL HIGH (ref 70–99)
Glucose-Capillary: 275 mg/dL — ABNORMAL HIGH (ref 70–99)
Glucose-Capillary: 321 mg/dL — ABNORMAL HIGH (ref 70–99)
Glucose-Capillary: 314 mg/dL — ABNORMAL HIGH (ref 70–99)

## 2020-07-07 MED ORDER — HEPARIN SODIUM (PORCINE) 5000 UNIT/ML IJ SOLN
5000.0000 [IU] | Freq: Three times a day (TID) | INTRAMUSCULAR | Status: DC
  Administered 2020-07-07 – 2020-07-25 (×55): 5000 [IU] via SUBCUTANEOUS
  Filled 2020-07-07 (×54): qty 1

## 2020-07-07 MED ORDER — INSULIN DETEMIR 100 UNIT/ML ~~LOC~~ SOLN
24.0000 [IU] | Freq: Two times a day (BID) | SUBCUTANEOUS | Status: DC
  Administered 2020-07-07 – 2020-07-08 (×3): 24 [IU] via SUBCUTANEOUS
  Filled 2020-07-07 (×4): qty 0.24

## 2020-07-07 NOTE — Progress Notes (Signed)
NAME:  Harold FriskJames Mantell, MRN:  [id number], DOB:  12-Jan-1946, LOS: 8 ADMISSION DATE:  06/29/2020, CONSULTATION DATE:  06/29/2020 REFERRING MD:  Dr. Jake Samplesawley, CHIEF COMPLAINT:  Bilateral subarachnoid/subdural hematomas   Brief History    74 y o male with HTN and Diabetes who presented after ground level fall resulting in head injury while walking his dog in the park. Per EMS patient was seen drinking alcohol prior to fall.   On arrival patient was seen bleeding from his left ear. Vitals on arrival significant for mild tachypnea and hypertension. Labwork significant for hyperglycemia and mild leukocytosis. CT C-spin and head positive for  left scull fracture and bilateral subarachnoid/subdural hematomas. PCCM consulted for assistance ventilator managment  Past Medical History  HTN  Diabetes   Significant Hospital Events   Admitted 7/13  7.15  - Started on empiric cefepime 7/14 due to fever and leukocytosis.  PCT 1.06 and lactate 5.9. Remains on cleviprex at 20. Mental status about the same, minimally responsive. CT head read pending.   Consults:  Neurosurgery   ENT 7/15 - Dr Suszanne Connerseoh - >  Left longitudinal temporal bone fracture with possible separation of the malleus and incus. - Cotton ball packing of the left ear canal. Change cotton ball as needed.  The T-bone CT reviewed. No obvious otic capsule involvement.   No acute ENT intervention needed at this time. - The patient will need hearing test as an outpatient in my office. My need middle ear exploration if significant conductive hearing loss is noted.   Procedures:    Significant Diagnostic Tests:  CT C-spine and head 7/13 > 1. Nondisplaced fracture of the left parietal calvarium along the left lambdoid suture extending into the left occipital calvarium and left temporal bone with involvement of the left mastoid air cell and external auditory canal. Dedicated temporal bone CT is recommended for further evaluation. 2. Bilateral  subdural and subarachnoid hemorrhages. No midline shift. 3. Focal hemorrhage with small pockets of pneumocephalus in the left posterior temporal lobe along the calvarial fracture. This likely represents combination of subarachnoid and intraparenchymal hemorrhage. 4. No acute/traumatic cervical spine pathology. CT head 7/14 > blooming contusions in bilateral inferior frontal, bilateral, inferior temporal, and left parieto-occipital lobes.  Generalized increased in SAH, mild IVH without hydrocephalus.  Progressive scalp swelling over left calvarial fracture.  No pneumocephalus.  CT temporal bones 7/14 > longitudinal left temporal bone fx.  Hemotympanum  On left with pneuocephalus and soft tissue emphysema.  EEG 7/14 > mod diffuse encephalopathy.  No seizures or epileptiform activity noted. Head CT 17 >> no change, extensive hemorrhagic contusions in the frontal lobes bilaterally, bifrontal subdural hematoma, diffuse subarachnoid hemorrhage, IVH without hydrocephalus  EEG 7/19 >> Moderate to severe encephalopathy, nonspecific.  No seizures or epileptiform discharges seen  Micro Data:  COVID 7/13 > negative  Blood 7/14 > Respiratory 7/16 >> normal flora Blood 7/16 >>   Antimicrobials:  Cefepime 7/13 > 7/20 Vanco 7/16 >>  7/20  Interim history/subjective:   Continue to remain encephalopathic and unresponsive Was placed on pressure support trial, became tachypneic and tachycardic  Objective   Blood pressure 132/85, pulse (!) 125, temperature (!) 102.2 F (39 C), temperature source Axillary, resp. rate (!) 25, height 6\' 1"  (1.854 m), weight 70.2 kg, SpO2 98 %.    Vent Mode: CPAP;PSV FiO2 (%):  [30 %] 30 % Set Rate:  [16 bmp] 16 bmp Vt Set:  [630 mL] 630 mL PEEP:  [5 cmH20] 5 cmH20 Pressure Support:  [  10 cmH20] 10 cmH20 Plateau Pressure:  [19 cmH20-24 cmH20] 21 cmH20   Intake/Output Summary (Last 24 hours) at 07/07/2020 9381 Last data filed at 07/07/2020 0800 Gross per 24 hour    Intake 3154.68 ml  Output 3025 ml  Net 129.68 ml   Filed Weights   07/05/20 0500 07/06/20 0500 07/07/20 0500  Weight: 71.2 kg 70 kg 70.2 kg    Examination: General: Chronically ill looking male, orally intubated Head: Normocephalic Eyes: Pupils sluggishly reactive to light Ears: Cotton left ear, no active bleeding Nose: NG tube in place Neck: Normal Lungs: Good air movement, no wheeze, no crackles currently on PRVC Heart: Distant, regular, heart rate 120's Abdomen: Nondistended, positive bowel sounds Extremities: No edema Skin: No rash Neurologic: Does not open eyes, not following commands.  Forced left gaze, does not cross midline.  Very minimal movement with painful stimuli  Assessment & Plan:   Acute hypoxic respiratory Failure,  Patient is still not tolerating pressure support trial, I adjusted the ventilatory setting to maintain oxygenation to vital organs and prevent dysfunction We will continue to do SBT daily  Pulmonary hygiene VAP prevention order set  Mechanical fall resulting in Left skull fracture and bilateral subarachnoid/subdural hemorrhage Now off 3% saline.   CT scan showed severe bifrontal contusion/intraparenchymal hemorrhage  Persistent acute Encephalopathy.  He has not shown any improvement in mental status with time, discontinuation sedation Generalized tremor.  No evidence of seizure activity EEG 7/19 Unfortunately no significant change.   Prognosis unclear.  Defer to neurosurgery.,   Continue empiric Keppra  He is off Precedex and all other sedation, repeat CT yesterday did not show significant change but persistent subarachnoid, subdural hemorrhages and bifrontal hemorrhage/contusion Probably he will require tracheostomy and PEG tube placement   Left longitudinal temporal bone fracture with possible separation of the malleus and incus. -Appreciate Dr. Avel Sensor input and assistance.  No indication for surgery at this time but he will need follow-up,  assessment of his hearing, possible middle ear exploration in the future depending on status  Hypertension Now Cleviprex is off Continue Amlodipine, HCTZ, metoprolol Hydralazine and labetalol available as needed  Diabetes type 2, uncontrolled with hyperglycemia Resistant sliding scale Tube feed coverage 5 units Increase Levemir to 24 units twice daily  Sepsis (PAO) - unclear etiology at this point.  No clear source, consider fever neurological P:  Patient persistently has fever, source unclear.  He does have bibasilar atelectasis versus infiltrate on chest x-ray with leukocytosis. He was on vancomycin and cefepime, which was discontinued yesterday after 7 days therapy So far cultures have been negative Unable to determine if sepsis was ruled out  Alcohol intoxication. - ETOH level 26 on arrival, unsure if this is acute vs chronic P: Completed thiamine and folate supplementation  Electrolyte imbalance Follow BMP, replace electrolytes if indicated  Best practice:  Diet: NPO (tolerating tube feeds) Pain/Anxiety/Delirium protocol (if indicated): PRN  VAP protocol (if indicated): N/A DVT prophylaxis: SCD GI prophylaxis: PPI Glucose control: SSI/Levemir Mobility: Bedrest  Code Status: Full Family Communication: Per primary service Disposition: ICU   LABS    PULMONARY Recent Labs  Lab 06/30/20 1219 07/01/20 2055  PHART 7.411 7.444  PCO2ART 27.0* 31.6*  PO2ART 72* 235*  HCO3 16.8* 21.3  TCO2 18* 22  O2SAT 93.0 100.0    CBC Recent Labs  Lab 07/05/20 0805 07/06/20 0632 07/07/20 0407  HGB 10.0* 10.1* 10.4*  HCT 30.8* 31.2* 32.6*  WBC 13.7* 11.5* 11.7*  PLT 211 217 230  COAGULATION No results for input(s): INR in the last 168 hours.  CARDIAC  No results for input(s): TROPONINI in the last 168 hours. No results for input(s): PROBNP in the last 168 hours.   CHEMISTRY Recent Labs  Lab 07/01/20 0655 07/01/20 0655 07/01/20 1634 07/01/20 2055  07/02/20 0448 07/02/20 1420 07/03/20 1311 07/03/20 2234 07/04/20 0322 07/04/20 0322 07/04/20 1453 07/04/20 2232 07/05/20 0805 07/05/20 0805 07/06/20 0632 07/07/20 0407  NA  --   --   --    < > 133*   < > 140   < > 145   < > 144 145 147*  --  146* 146*  K  --   --   --    < > 3.5  --  4.1  --  4.7   < >  --   --  4.2   < > 4.1 4.5  CL  --   --   --    < > 100  --  109  --  112*  --   --   --  113*  --  113* 109  CO2  --   --   --    < > 19*  --  20*  --  21*  --   --   --  22  --  23 24  GLUCOSE  --   --   --    < > 270*  --  279*  --  308*  --   --   --  330*  --  268* 308*  BUN  --   --   --    < > 20  --  20  --  24*  --   --   --  30*  --  33* 32*  CREATININE  --   --   --    < > 1.01  --  0.77  --  0.80  --   --   --  1.01  --  1.06 1.00  CALCIUM  --   --   --    < > 8.9  --  9.4  --  9.6  --   --   --  10.1  --  10.0 10.1  MG 2.0  --   --   --  1.8  --  2.1  --   --   --   --   --   --   --   --   --   PHOS 2.3*   < > 2.5  --  2.0*  --  2.0*  --  2.5  --   --   --  3.2  --   --   --    < > = values in this interval not displayed.   Estimated Creatinine Clearance: 63.4 mL/min (by C-G formula based on SCr of 1 mg/dL).   LIVER Recent Labs  Lab 07/02/20 0448  AST 21  ALT 19  ALKPHOS 30*  BILITOT 0.8  PROT 6.4*  ALBUMIN 2.8*     INFECTIOUS Recent Labs  Lab 06/30/20 1252 06/30/20 1252 07/01/20 2221 07/02/20 0448 07/05/20 0805 07/06/20 5631  LATICACIDVEN 5.9*  --  3.8*  --   --   --   PROCALCITON 1.06   < >  --  6.07 1.75 1.60   < > = values in this interval not displayed.     ENDOCRINE CBG (last 3)  Recent Labs    07/06/20  2316 07/07/20 0318 07/07/20 0729  GLUCAP 297* 274* 293*    Total critical care time: 41 minutes  Performed by: Cheri Fowler   Critical care time was exclusive of separately billable procedures and treating other patients.   Critical care was necessary to treat or prevent imminent or life-threatening deterioration.   Critical  care was time spent personally by me on the following activities: development of treatment plan with patient and/or surrogate as well as nursing, discussions with consultants, evaluation of patient's response to treatment, examination of patient, obtaining history from patient or surrogate, ordering and performing treatments and interventions, ordering and review of laboratory studies, ordering and review of radiographic studies, pulse oximetry and re-evaluation of patient's condition.   Cheri Fowler MD Critical care physician [facility]  Pager: [phone] Mobile: [phone]

## 2020-07-07 NOTE — Progress Notes (Signed)
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  She is going to discuss with her brother and bring her mom by later today to the hospital and let us know her decision this evening.  I told her there is no rush to decide today but he has been in the hospital for just over a week in which I believe the neck step of care for him as he is not awake enough to protect his own airway is trach and PEG.  She understands the significance of his brain injury and I explained the lengthy recovery ahead of him.  I answered all of her questions and will update her with any new changes.  Monia Pouch, DO Neurosurgeon [facility] Cell: [phone]

## 2020-07-07 NOTE — Progress Notes (Signed)
Inpatient Diabetes Program Recommendations  AACE/ADA: New Consensus Statement on Inpatient Glycemic Control (2015)  Target Ranges:  Prepandial:   less than 140 mg/dL      Peak postprandial:   less than 180 mg/dL (1-2 hours)      Critically ill patients:  140 - 180 mg/dL   Lab Results  Component Value Date   GLUCAP 293 (H) 07/07/2020   HGBA1C 8.5 (H) 06/30/2020    Review of Glycemic Control Results for Harold Mckee, Harold Mckee (MRN [id number]) as of 07/07/2020 09:55  Ref. Range 07/06/2020 15:20 07/06/2020 19:29 07/06/2020 23:16 07/07/2020 03:18 07/07/2020 07:29  Glucose-Capillary Latest Ref Range: 70 - 99 mg/dL 003 (H) 491 (H) 791 (H) 274 (H) 293 (H)    Inpatient Diabetes Program Recommendations:    CBG's upper 200's.  Noted Levemir was increased today from 20 units bid to 24 units bid.  Please consider increasing tube feed coverage as well to Novolog 8 units q4h.    Will continue to follow while inpatient.  Thank you, Dulce Sellar, RN, BSN Diabetes Coordinator Inpatient Diabetes Program [phone] (team pager from 8a-5p)

## 2020-07-07 NOTE — Progress Notes (Signed)
Subjective: Intubated, off sedation  Objective: PE: Intubated Eyesopen slightly to pain midline gaze Pupils equally round reactive to light, brisk Face symmetric, winces to pain No motor responseBUE, WD to pain in LEs   Assessment/Plan: 1. Left occipital parietotemporal calvarial fracture secondary to fall 2.Left temporal contusion 3.Bilateral frontal traumatic subarachnoidhemorrhage & contusions/subdural hematomaswith cerebral edema 4.TBI, severe 5. Hyponatremia, resolved   -neuro ICU -Q2hr neurochecks -Keppra 1000 mg twice daily(EEG neg x2) -N.p.o., coretrak feeds -Blood pressure control -ok for SQH -CT brain stable 7/20 -persistent encephalopathy likely due to significant traumatic brain injury -will likely need trach/PEG and rehab, will d/w family, CCM mentioned they will perform trach if needed  Please do no hesitate to call with questions or concerns  Monia Pouch, DO Neurosurgeon [facility] Cell: [phone]

## 2020-07-08 ENCOUNTER — Inpatient Hospital Stay (HOSPITAL_COMMUNITY): Payer: Medicare HMO

## 2020-07-08 DIAGNOSIS — M7989 Other specified soft tissue disorders: Secondary | ICD-10-CM

## 2020-07-08 LAB — GLUCOSE, CAPILLARY
Glucose-Capillary: 249 mg/dL — ABNORMAL HIGH (ref 70–99)
Glucose-Capillary: 209 mg/dL — ABNORMAL HIGH (ref 70–99)
Glucose-Capillary: 172 mg/dL — ABNORMAL HIGH (ref 70–99)
Glucose-Capillary: 183 mg/dL — ABNORMAL HIGH (ref 70–99)
Glucose-Capillary: 185 mg/dL — ABNORMAL HIGH (ref 70–99)
Glucose-Capillary: 224 mg/dL — ABNORMAL HIGH (ref 70–99)

## 2020-07-08 MED ORDER — GLUCERNA 1.2 CAL PO LIQD
1000.0000 mL | ORAL | Status: DC
  Administered 2020-07-08 – 2020-07-25 (×21): 1000 mL
  Filled 2020-07-08 (×30): qty 1000

## 2020-07-08 MED ORDER — INSULIN DETEMIR 100 UNIT/ML ~~LOC~~ SOLN
26.0000 [IU] | Freq: Two times a day (BID) | SUBCUTANEOUS | Status: DC
  Administered 2020-07-08 – 2020-07-19 (×22): 26 [IU] via SUBCUTANEOUS
  Filled 2020-07-08 (×23): qty 0.26

## 2020-07-08 MED ORDER — SEMAGLUTIDE(0.25 OR 0.5MG/DOS) 2 MG/1.5ML ~~LOC~~ SOPN
0.5000 mg | PEN_INJECTOR | SUBCUTANEOUS | Status: DC
  Administered 2020-07-08 – 2020-07-14 (×2): 0.5067 mg via SUBCUTANEOUS
  Filled 2020-07-08 (×2): qty 0.38

## 2020-07-08 NOTE — Progress Notes (Signed)
NAME:  Harold Mckee, MRN:  [id number], DOB:  1946-02-18, LOS: 9 ADMISSION DATE:  06/29/2020, CONSULTATION DATE:  06/29/2020 REFERRING MD:  Dr. Jake Samples, CHIEF COMPLAINT:  Bilateral subarachnoid/subdural hematomas   Brief History    74 y o male with HTN and Diabetes who presented after ground level fall resulting in head injury while [place] his [place] the park. Per EMS patient was seen drinking alcohol prior to fall.   On arrival patient was seen bleeding from his left ear. Vitals on arrival significant for mild tachypnea and hypertension. Labwork significant for hyperglycemia and mild leukocytosis. CT C-spin and head positive for  left scull fracture and bilateral subarachnoid/subdural hematomas. PCCM consulted for assistance ventilator managment  Past Medical History  HTN  Diabetes   Significant Hospital Events   Admitted 7/13  7.15  - Started on empiric cefepime 7/14 due to fever and leukocytosis.  PCT 1.06 and lactate 5.9. Cleviprex was weaned off   Consults:  Neurosurgery   ENT 7/15 - Dr Suszanne Conners - >  Left longitudinal temporal bone fracture with possible separation of the malleus and incus. - Cotton ball packing of the left ear canal. Change cotton ball as needed.  The T-bone CT reviewed. No obvious otic capsule involvement.   No acute ENT intervention needed at this time. - The patient will need hearing test as an outpatient in my office. My need middle ear exploration if significant conductive hearing loss is noted.   Procedures:    Significant Diagnostic Tests:  CT C-spine and head 7/13 > 1. Nondisplaced fracture of the left parietal calvarium along the left lambdoid suture extending into the left occipital calvarium and left temporal bone with involvement of the left mastoid air cell and external auditory canal. Dedicated temporal bone CT is recommended for further evaluation. 2. Bilateral subdural and subarachnoid hemorrhages. No midline shift. 3. Focal hemorrhage with  small pockets of pneumocephalus in the left posterior temporal lobe along the calvarial fracture. This likely represents combination of subarachnoid and intraparenchymal hemorrhage. 4. No acute/traumatic cervical spine pathology. CT head 7/14 > blooming contusions in bilateral inferior frontal, bilateral, inferior temporal, and left parieto-occipital lobes.  Generalized increased in SAH, mild IVH without hydrocephalus.  Progressive scalp swelling over left calvarial fracture.  No pneumocephalus.  CT temporal bones 7/14 > longitudinal left temporal bone fx.  Hemotympanum  On left with pneuocephalus and soft tissue emphysema.  EEG 7/14 > mod diffuse encephalopathy.  No seizures or epileptiform activity noted. Head CT 17 >> no change, extensive hemorrhagic contusions in the frontal lobes bilaterally, bifrontal subdural hematoma, diffuse subarachnoid hemorrhage, IVH without hydrocephalus  EEG 7/19 >> Moderate to severe encephalopathy, nonspecific.  No seizures or epileptiform discharges seen  Micro Data:  COVID 7/13 > negative  Blood 7/14 > negative Respiratory 7/16 >> normal flora  7/22 >> Blood 7/16 >> negative  Antimicrobials:  Cefepime 7/13 > 7/20 Vanco 7/16 >>  7/20  Interim history/subjective:  Patient continues to spike high-grade fever to 103, he had foul-smelling greenish secretion via trach Continue to remain encephalopathic and unresponsive Not able to tolerate pressure support trial due to respiratory distress  Objective   Blood pressure 118/75, pulse 100, temperature (!) 101 F (38.3 C), temperature source Axillary, resp. rate (!) 26, height 6\' 1"  (1.854 m), weight 70 kg, SpO2 100 %.    Vent Mode: CPAP;PSV FiO2 (%):  [30 %] 30 % Set Rate:  [16 bmp-18 bmp] 16 bmp Vt Set:  [630 mL] 630 mL PEEP:  [  5 cmH20] 5 cmH20 Pressure Support:  [15 cmH20] 15 cmH20 Plateau Pressure:  [20 cmH20-25 cmH20] 20 cmH20   Intake/Output Summary (Last 24 hours) at 07/08/2020 9021 Last data  filed at 07/08/2020 0400 Gross per 24 hour  Intake 1840.5 ml  Output 2350 ml  Net -509.5 ml   Filed Weights   07/06/20 0500 07/07/20 0500 07/08/20 0500  Weight: 70 kg 70.2 kg 70 kg    Examination: General: Chronically ill looking male, orally intubated Head: Normocephalic Eyes: Pupils sluggishly reactive to light Ears: Cotton left ear, no active bleeding Nose: NG tube in place Neck: Normal Lungs: Good air movement, no wheeze, no crackles Heart: Tachycardic with heart rate between 100-110, regular rhythm, no murmur Abdomen: Nondistended, positive bowel sounds Extremities: No edema Skin: No rash Neurologic: Does not open eyes, not following commands.  Today he has forced right gaze, does not cross midline.  Withdraws in bilateral lower extremities, minimal right upper extremity movement  Assessment & Plan:   Acute hypoxic respiratory Failure,  Patient is not able to tolerate pressure support trial due to respiratory distress, ventilator setting was adjusted to prevent hypoxemia to vital organs Probably he will require trach We will get respiratory culture Pulmonary hygiene VAP prevention order set  Mechanical fall resulting in Left skull fracture and bilateral subarachnoid/subdural hemorrhage Of 3% saline for last few days CT scan showed severe bifrontal contusion/intraparenchymal hemorrhage  Persistent acute traumatic encephalopathy.  He has not shown any improvement in mental status with time, discontinuation sedation Unfortunately no significant change in his mental status EEG ruled out acute seizure Continue empiric Keppra  Off sedation now Continue goals of care discussion with the family per primary team  Left longitudinal temporal bone fracture with possible separation of the malleus and incus. -Appreciate Dr. Avel Sensor input and assistance.  No indication for surgery at this time but he will need follow-up, assessment of his hearing, possible middle ear exploration in  the future depending on status  Hypertension Continue Amlodipine, HCTZ, metoprolol Hydralazine and labetalol available as needed  Diabetes type 2, uncontrolled with hyperglycemia Resistant to sliding scale Tube feed coverage 5 units We will further increase Levemir to 26 units twice daily  Sepsis (PAO) - unclear etiology at this point.  No clear source, consider fever neurological P:  Patient persistently has fever, source unclear.  He does have bibasilar atelectasis versus infiltrate on chest x-ray with leukocytosis. He was on vancomycin and cefepime, which was discontinued yesterday after 7 days therapy This could be central fever We will get respiratory culture as his secretions are foul-smelling and greenish in color Unable to determine if sepsis was ruled out  Alcohol intoxication. - ETOH level 26 on arrival, unsure if this is acute vs chronic P: Completed thiamine and folate supplementation  Electrolyte imbalance Follow BMP, replace electrolytes if indicated  Best practice:  Diet: NPO (tolerating tube feeds) Pain/Anxiety/Delirium protocol (if indicated): PRN  VAP protocol (if indicated): N/A DVT prophylaxis: Subcu heparin GI prophylaxis: PPI Glucose control: SSI/Levemir Mobility: Bedrest  Code Status: Full Family Communication: Per primary service Disposition: ICU   LABS    PULMONARY Recent Labs  Lab 07/01/20 2055  PHART 7.444  PCO2ART 31.6*  PO2ART 235*  HCO3 21.3  TCO2 22  O2SAT 100.0    CBC Recent Labs  Lab 07/06/20 0632 07/07/20 0407 07/07/20 1058  HGB 10.1* 10.4* 9.9*  HCT 31.2* 32.6* 31.3*  WBC 11.5* 11.7* 11.6*  PLT 217 230 248    COAGULATION No results  for input(s): INR in the last 168 hours.  CARDIAC  No results for input(s): TROPONINI in the last 168 hours. No results for input(s): PROBNP in the last 168 hours.   CHEMISTRY Recent Labs  Lab 07/01/20 1634 07/01/20 2055 07/02/20 0448 07/02/20 1420 07/03/20 1311  07/03/20 2234 07/04/20 0322 07/04/20 0322 07/04/20 1453 07/04/20 2232 07/05/20 0805 07/05/20 0805 07/06/20 0632 07/07/20 0407 07/07/20 1058  NA  --    < > 133*   < > 140   < > 145   < > 144 145 147*  --  146* 146*  --   K  --    < > 3.5  --  4.1  --  4.7   < >  --   --  4.2   < > 4.1 4.5  --   CL  --    < > 100  --  109  --  112*  --   --   --  113*  --  113* 109  --   CO2  --    < > 19*  --  20*  --  21*  --   --   --  22  --  23 24  --   GLUCOSE  --    < > 270*  --  279*  --  308*  --   --   --  330*  --  268* 308*  --   BUN  --    < > 20  --  20  --  24*  --   --   --  30*  --  33* 32*  --   CREATININE  --    < > 1.01  --  0.77  --  0.80  --   --   --  1.01  --  1.06 1.00 1.14  CALCIUM  --    < > 8.9  --  9.4  --  9.6  --   --   --  10.1  --  10.0 10.1  --   MG  --   --  1.8  --  2.1  --   --   --   --   --   --   --   --   --   --   PHOS 2.5  --  2.0*  --  2.0*  --  2.5  --   --   --  3.2  --   --   --   --    < > = values in this interval not displayed.   Estimated Creatinine Clearance: 55.4 mL/min (by C-G formula based on SCr of 1.14 mg/dL).   LIVER Recent Labs  Lab 07/02/20 0448  AST 21  ALT 19  ALKPHOS 30*  BILITOT 0.8  PROT 6.4*  ALBUMIN 2.8*     INFECTIOUS Recent Labs  Lab 07/01/20 2221 07/02/20 0448 07/05/20 0805 07/06/20 0632  LATICACIDVEN 3.8*  --   --   --   PROCALCITON  --  6.07 1.75 1.60     ENDOCRINE CBG (last 3)  Recent Labs    07/07/20 2317 07/08/20 0315 07/08/20 0758  GLUCAP 314* 249* 209*    Total critical care time: 34 minutes  Performed by: Cheri Fowler   Critical care time was exclusive of separately billable procedures and treating other patients.   Critical care was necessary to treat or prevent imminent or life-threatening deterioration.   Critical care was  time spent personally by me on the following activities: development of treatment plan with patient and/or surrogate as well as nursing, discussions with consultants,  evaluation of patient's response to treatment, examination of patient, obtaining history from patient or surrogate, ordering and performing treatments and interventions, ordering and review of laboratory studies, ordering and review of radiographic studies, pulse oximetry and re-evaluation of patient's condition.   Cheri FowlerSudham Annslee Tercero MD Critical care physician [facility]  Pager: [phone] Mobile: [phone]

## 2020-07-08 NOTE — Progress Notes (Signed)
Upper extremity venous LT study completed.   See Cv Proc for preliminary results.   Harold Mckee  

## 2020-07-08 NOTE — Progress Notes (Signed)
Subjective:   Intubated, off sedation  Objective: PE: Intubated Eyesopen slightly to pain Rightward gaze pref, does not track Pupils equally round reactive to light, brisk Face symmetric, winces to pain RUE WD to pain, min motor response in LUE BLE WD to pain, LLE >RLE   Assessment/Plan: 1. Left occipital parietotemporal calvarial fracture secondary to fall 2.Left temporal contusion 3.Bilateral frontal traumatic subarachnoidhemorrhage & contusions/subdural hematomaswith cerebral edema 4.TBI, severe 5. Hyponatremia, resolved   -neuro ICU -Q2hr neurochecks -Keppra 1000 mg twice daily(EEG negx2) -N.p.o., coretrak feeds -Blood pressure control -ok for SQH -CT brain stable 7/20 -will likely need trach/PEG and rehab, will d/w family, awaiting their decision -continue close monitoring and supportive care  Please do no hesitate to call with questions or concerns  Monia Pouch, DO Neurosurgeon [facility] Cell: [phone]

## 2020-07-08 NOTE — Progress Notes (Signed)
Nutrition Follow-up  DOCUMENTATION CODES:   Not applicable  INTERVENTION:   Tube feeding via Cortrak tube; tip gastric: Glucerna 1.2 at 65 ml/h (1560 ml per day) Prosource TF 90 ml BID  Provides 2032 kcal, 137 gm protein, 1265 ml free water daily   NUTRITION DIAGNOSIS:   Inadequate oral intake related to inability to eat as evidenced by NPO status. Ongoing.   GOAL:   Patient will meet greater than or equal to 90% of their needs Met with EN  MONITOR:   TF tolerance, Diet advancement  REASON FOR ASSESSMENT:   Consult Enteral/tube feeding initiation and management  ASSESSMENT:   Pt with PMH of HTN and DM admitted after fall while walking his dog with L skull fx and bilateral SAH/SDH.   Pt discussed during ICU rounds and with RN.  Neurosurgery discussing trach/PEG with family  + fever: pt was on abx therapy x 7 days; cultures negative so far per MD  7/14 cortrak placement; tip gastric   Patient is currently intubated on ventilator support MV: 13.4 L/min Temp (24hrs), Avg:101.1 F (38.4 C), Min:99.6 F (37.6 C), Max:103.3 F (39.6 C)   Medications reviewed and include: colace, folic acid, SSI, 5 units novolog every 4 hours, 24 units levemir BID, MVI, miralax, senokot   Labs reviewed: Na 146 CBG's: [phone]  Current TF: Osmolite 1.2 at 65 ml/h  Prosource TF 90 ml BID  Diet Order:   Diet Order            Diet NPO time specified  Diet effective now                 EDUCATION NEEDS:   No education needs have been identified at this time  Skin:  Skin Assessment: Reviewed RN Assessment  Last BM:  unknown  Height:   Ht Readings from Last 1 Encounters:  06/30/20 _0  (1.854 m)    Weight:   Wt Readings from Last 1 Encounters:  07/08/20 70 kg    Ideal Body Weight:  83.6 kg  BMI:  Body mass index is 20.36 kg/m.  Estimated Nutritional Needs:   Kcal:  2100  Protein:  120-130 grams  Fluid:  2 L/day  Lockie Pares., RD, LDN,  CNSC See AMiON for contact information

## 2020-07-09 ENCOUNTER — Inpatient Hospital Stay (HOSPITAL_COMMUNITY): Payer: Medicare HMO

## 2020-07-09 LAB — PROTIME-INR
INR: 1.2 (ref 0.8–1.2)
Prothrombin Time: 14.5 seconds (ref 11.4–15.2)

## 2020-07-09 LAB — GLUCOSE, CAPILLARY
Glucose-Capillary: 197 mg/dL — ABNORMAL HIGH (ref 70–99)
Glucose-Capillary: 170 mg/dL — ABNORMAL HIGH (ref 70–99)
Glucose-Capillary: 251 mg/dL — ABNORMAL HIGH (ref 70–99)
Glucose-Capillary: 162 mg/dL — ABNORMAL HIGH (ref 70–99)
Glucose-Capillary: 99 mg/dL (ref 70–99)
Glucose-Capillary: 252 mg/dL — ABNORMAL HIGH (ref 70–99)

## 2020-07-09 LAB — APTT: aPTT: 31 seconds (ref 24–36)

## 2020-07-09 MED ORDER — FENTANYL CITRATE (PF) 100 MCG/2ML IJ SOLN
200.0000 ug | Freq: Once | INTRAMUSCULAR | Status: AC
  Administered 2020-07-09: 100 ug via INTRAVENOUS
  Filled 2020-07-09: qty 4

## 2020-07-09 MED ORDER — PROPOFOL 10 MG/ML IV BOLUS
1.0000 mg/kg | Freq: Once | INTRAVENOUS | Status: AC
  Filled 2020-07-09: qty 20

## 2020-07-09 MED ORDER — PROPOFOL 10 MG/ML IV BOLUS
INTRAVENOUS | Status: AC
  Administered 2020-07-09: 30 mg via INTRAVENOUS
  Filled 2020-07-09: qty 20

## 2020-07-09 MED ORDER — VECURONIUM BROMIDE 10 MG IV SOLR
10.0000 mg | Freq: Once | INTRAVENOUS | Status: AC
  Administered 2020-07-09: 10 mg via INTRAVENOUS
  Filled 2020-07-09: qty 10

## 2020-07-09 MED ORDER — BISACODYL 10 MG RE SUPP
10.0000 mg | Freq: Every day | RECTAL | Status: DC | PRN
  Administered 2020-07-09: 10 mg via RECTAL
  Filled 2020-07-09: qty 1

## 2020-07-09 MED ORDER — ETOMIDATE 2 MG/ML IV SOLN
INTRAVENOUS | Status: AC
  Administered 2020-07-09: 20 mg
  Filled 2020-07-09: qty 10

## 2020-07-09 MED ORDER — MIDAZOLAM HCL 2 MG/2ML IJ SOLN
5.0000 mg | Freq: Once | INTRAMUSCULAR | Status: AC
  Administered 2020-07-09: 2 mg via INTRAVENOUS
  Filled 2020-07-09: qty 6

## 2020-07-09 NOTE — Procedures (Signed)
Percutaneous Tracheostomy Procedure Note   Fredderick Swanger  [id number]  [birth year]  Date:07/09/20  Time:2:50 PM   Provider Performing:Pete E Tanja Port  Procedure: Percutaneous Tracheostomy with Bronchoscopic Guidance (23762)  Indication(s) Vent weaning and airway protection   Consent Risks of the procedure as well as the alternatives and risks of each were explained to the patient and/or caregiver.  Consent for the procedure was obtained.  Anesthesia Etomidate, Versed, Fentanyl, Vecuronium   Time Out Verified patient identification, verified procedure, site/side was marked, verified correct patient position, special equipment/implants available, medications/allergies/relevant history reviewed, required imaging and test results available.   Sterile Technique Maximal sterile technique including sterile barrier drape, hand hygiene, sterile gown, sterile gloves, mask, hair covering.    Procedure Description Appropriate anatomy identified by palpation.  Patient's neck prepped and draped in sterile fashion.  1% lidocaine with epinephrine was used to anesthetize skin overlying neck.  1.5cm incision made and blunt dissection performed until tracheal rings could be easily palpated.   Then a size 6 cuffed  Shiley tracheostomy was placed under bronchoscopic visualization using usual Seldinger technique and serial dilation.   Bronchoscope confirmed placement above the carina.  Tracheostomy was sutured in place with adhesive pad to protect skin under pressure.    Patient connected to ventilator.   Complications/Tolerance None; patient tolerated the procedure well. Chest X-ray is ordered to confirm no post-procedural complication.   EBL Minimal   Specimen(s) None  \ Simonne Martinet ACNP-BC [facility] Pulmonary/Critical Care Pager # [phone] OR # [phone] if no answer

## 2020-07-09 NOTE — Consult Note (Signed)
[facility] Surgery Consult Note  Harold Mckee [birth year]  [id number].    Requesting MD: Monia Pouch Chief Complaint:  fall Reason for Consult: PEG placement  HPI: Patient is a 74 year old male who was brought to the ED after a fall while walking his dog 06/29/20.  He has a history of diabetes and high blood pressure is on aspirin.  Patient's mental status was altered he was unable to provide any history at the time of admission.  CT on admission showed severe TBI withleft occipital parietotemporal calvarial fracture secondary to fall,Left temporal contusion Bilateral frontal traumatic subarachnoidhemorrhage and contusions/subdural hematomaswith cerebral edema.   Patient has been undergoing medical management.  He has shown no improvement in his mental status with discontinuation of sedation.  He is unresponsive.  No surgical management.  He has failed spontaneous breathing trials and is scheduled for a tracheostomy.  We are asked to see and evaluate for PEG placement. No prior abdominal surgeries. He is on subq heparin. No other blood thinners. He is scheduled for trach with CCM today.   ROS: Review of Systems  Unable to perform ROS: Intubated    No family history on file.  Past Medical History:  Diagnosis Date  . Diabetes mellitus without complication (HCC)   . Hypertension     History reviewed. No pertinent surgical history.  Social History:  has no history on file for tobacco use, alcohol use, and drug use.  Allergies: No Known Allergies  Medications Prior to Admission  Medication Sig Dispense Refill  . amLODipine (NORVASC) 10 MG tablet Take 10 mg by mouth daily.    Marland Kitchen Apoaequorin (PREVAGEN PO) Take 1 tablet by mouth daily.    Marland Kitchen aspirin 81 MG chewable tablet Chew 81 mg by mouth daily.    . Carboxymethylcellul-Glycerin (CLEAR EYES FOR DRY EYES) 1-0.25 % SOLN Place 1 drop into both eyes daily as needed (dry eyes).    . hydrochlorothiazide (HYDRODIURIL) 25 MG tablet  Take 25 mg by mouth daily.    . metFORMIN (GLUCOPHAGE) 1000 MG tablet Take 1,000 mg by mouth 2 (two) times daily with a meal.    . metoprolol (TOPROL-XL) 200 MG 24 hr tablet Take 200 mg by mouth daily.    Marland Kitchen omeprazole (PRILOSEC) 40 MG capsule Take 40 mg by mouth daily.    . Semaglutide,0.25 or 0.5MG /DOS, (OZEMPIC, 0.25 OR 0.5 MG/DOSE,) 2 MG/1.5ML SOPN Inject 0.5 mg into the skin every Wednesday.     Vent Mode: PRVC FiO2 (%):  [30 %] 30 % Set Rate:  [16 bmp] 16 bmp Vt Set:  [630 mL] 630 mL PEEP:  [2 cmH20-5 cmH20] 5 cmH20 Pressure Support:  [15 cmH20] 15 cmH20 Plateau Pressure:  [17 cmH20-22 cmH20] 21 cmH20   Blood pressure 109/76, pulse (!) 127, temperature 99.7 F (37.6 C), temperature source Axillary, resp. rate 22, height 6\' 1"  (1.854 m), weight 70 kg, SpO2 99 %. Physical Exam:  General: WD, WN male who is lying in bed, sedated on the vent HEENT: head is normocephalic, atraumatic.  Sclera are noninjected.  PERRL.  Ears and nose without any masses or lesions.  Mouth is pink and moist. ETT in place.  Heart: Tachycardic with regular rate.  No obvious murmurs.  Palpable radial and pedal pulses bilaterally Lungs: CTAB, no wheezes, rhonchi, or rales noted. On vent.  Abd: Soft, mild distension, no rigidity, +BS, no masses, hernias, or organomegaly. No prior abdominal scars.  MS: all 4 extremities are symmetrical with no cyanosis, clubbing, or edema.  Skin: warm and dry with no masses, lesions, or rashes Neuro: Sedated on vent. Does not respond to verbal commands.  Psych: Unable to perform. Sedated on vent.    Results for orders placed or performed during the hospital encounter of 06/29/20 (from the past 48 hour(s))  Glucose, capillary     Status: Abnormal   Collection Time: 07/07/20  4:41 PM  Result Value Ref Range   Glucose-Capillary 275 (H) 70 - 99 mg/dL    Comment: Glucose reference range applies only to samples taken after fasting for at least 8 hours.  Glucose, capillary     Status:  Abnormal   Collection Time: 07/07/20  7:32 PM  Result Value Ref Range   Glucose-Capillary 321 (H) 70 - 99 mg/dL    Comment: Glucose reference range applies only to samples taken after fasting for at least 8 hours.  Glucose, capillary     Status: Abnormal   Collection Time: 07/07/20 11:17 PM  Result Value Ref Range   Glucose-Capillary 314 (H) 70 - 99 mg/dL    Comment: Glucose reference range applies only to samples taken after fasting for at least 8 hours.  Glucose, capillary     Status: Abnormal   Collection Time: 07/08/20  3:15 AM  Result Value Ref Range   Glucose-Capillary 249 (H) 70 - 99 mg/dL    Comment: Glucose reference range applies only to samples taken after fasting for at least 8 hours.  Glucose, capillary     Status: Abnormal   Collection Time: 07/08/20  7:58 AM  Result Value Ref Range   Glucose-Capillary 209 (H) 70 - 99 mg/dL    Comment: Glucose reference range applies only to samples taken after fasting for at least 8 hours.  Culture, respiratory (non-expectorated)     Status: None (Preliminary result)   Collection Time: 07/08/20 11:22 AM   Specimen: Tracheal Aspirate; Respiratory  Result Value Ref Range   Specimen Description TRACHEAL ASPIRATE    Special Requests NONE    Gram Stain      RARE WBC PRESENT, PREDOMINANTLY PMN FEW GRAM POSITIVE COCCI RARE GRAM NEGATIVE RODS RARE GRAM POSITIVE RODS Performed at [facility], [address], [place], [place] [address]    Culture FEW GRAM NEGATIVE RODS    Report Status PENDING   Glucose, capillary     Status: Abnormal   Collection Time: 07/08/20 11:29 AM  Result Value Ref Range   Glucose-Capillary 172 (H) 70 - 99 mg/dL    Comment: Glucose reference range applies only to samples taken after fasting for at least 8 hours.  Glucose, capillary     Status: Abnormal   Collection Time: 07/08/20  3:14 PM  Result Value Ref Range   Glucose-Capillary 183 (H) 70 - 99 mg/dL    Comment: Glucose reference range applies only to  samples taken after fasting for at least 8 hours.  Glucose, capillary     Status: Abnormal   Collection Time: 07/08/20  7:15 PM  Result Value Ref Range   Glucose-Capillary 185 (H) 70 - 99 mg/dL    Comment: Glucose reference range applies only to samples taken after fasting for at least 8 hours.  Glucose, capillary     Status: Abnormal   Collection Time: 07/08/20 11:11 PM  Result Value Ref Range   Glucose-Capillary 224 (H) 70 - 99 mg/dL    Comment: Glucose reference range applies only to samples taken after fasting for at least 8 hours.  Glucose, capillary  Status: Abnormal   Collection Time: 07/09/20  3:33 AM  Result Value Ref Range   Glucose-Capillary 197 (H) 70 - 99 mg/dL    Comment: Glucose reference range applies only to samples taken after fasting for at least 8 hours.  Glucose, capillary     Status: Abnormal   Collection Time: 07/09/20  7:44 AM  Result Value Ref Range   Glucose-Capillary 170 (H) 70 - 99 mg/dL    Comment: Glucose reference range applies only to samples taken after fasting for at least 8 hours.  Glucose, capillary     Status: Abnormal   Collection Time: 07/09/20 11:19 AM  Result Value Ref Range   Glucose-Capillary 251 (H) 70 - 99 mg/dL    Comment: Glucose reference range applies only to samples taken after fasting for at least 8 hours.   VAS US UPPER EXTREMITY VENOUS DUPLEX  Result Date: 07/08/2020 UPPER VENOUS STUDY  Indications: Swelling Comparison Study: No prior studies. Performing Technologist: Jean Rosenthalachel Hodge  Examination Guidelines: A complete evaluation includes B-mode imaging, spectral Doppler, color Doppler, and power Doppler as needed of all accessible portions of each vessel. Bilateral testing is considered an integral part of a complete examination. Limited examinations for reoccurring indications may be performed as noted.  Left Findings: +----------+------------+---------+-----------+----------+--------------+ LEFT       CompressiblePhasicitySpontaneousProperties   Summary     +----------+------------+---------+-----------+----------+--------------+ IJV           Full       Yes       Yes                             +----------+------------+---------+-----------+----------+--------------+ Subclavian    Full       Yes       Yes                             +----------+------------+---------+-----------+----------+--------------+ Axillary      Full       Yes       Yes                             +----------+------------+---------+-----------+----------+--------------+ Brachial      Full       Yes       Yes                             +----------+------------+---------+-----------+----------+--------------+ Radial        Full                                                 +----------+------------+---------+-----------+----------+--------------+ Ulnar         Full                                                 +----------+------------+---------+-----------+----------+--------------+ Cephalic                                            Not  visualized +----------+------------+---------+-----------+----------+--------------+ Basilic     Partial                Yes                  Acute      +----------+------------+---------+-----------+----------+--------------+  Summary:  Left: No evidence of deep vein thrombosis in the upper extremity. Findings consistent with acute superficial vein thrombosis involving the left basilic vein.  *See table(s) above for measurements and observations.  Diagnosing physician: Waverly Ferrari MD Electronically signed by Waverly Ferrari MD on 07/08/2020 at 11:12:53 PM.    Final    Anti-infectives (From admission, onward)   Start     Dose/Rate Route Frequency Ordered Stop   07/02/20 2200  vancomycin (VANCOREADY) IVPB 750 mg/150 mL        750 mg 150 mL/hr over 60 Minutes Intravenous Every 12 hours 07/02/20 0926 07/06/20 2343   07/02/20  1000  vancomycin (VANCOREADY) IVPB 1500 mg/300 mL        1,500 mg 150 mL/hr over 120 Minutes Intravenous  Once 07/02/20 0905 07/02/20 1259   06/30/20 1400  ceFEPIme (MAXIPIME) 2 g in sodium chloride 0.9 % 100 mL IVPB        2 g 200 mL/hr over 30 Minutes Intravenous Every 8 hours 06/30/20 1332 07/06/20 2359      Assessment/Plan Fall with left skull fracture and bilateral subarachnoid/subdural hemorrhage Acute hypoxic respiratory failure - CCM planning tracheostomy today Left longitudinal temporal bone fracture with possible separation -no surgery Hx Etoh use Type 2 diabetes Hypertension  Persistent Acute Traumatic Encephalopathy without improvement Consult for PEG placement  - CCM planning  - Will plan for PEG Monday. I have called the patients daughter, Harold Mckee, and obtained consent. Please hold tube feeds after midnight on the night of 7/25 into 7/26  Jacinto Halim, [facility] Surgery 07/09/2020, 11:41 AM Please see Amion for pager number during day hours 7:00am-4:30pm

## 2020-07-09 NOTE — TOC Initial Note (Signed)
Transition of Care [facility]) - Initial/Assessment Note    Patient Details  Name: Harold Mckee MRN: [id number] Date of Birth: 1946/09/04  Transition of Care [facility]) CM/SW Contact:    Glennon Mac, RN Phone Number: 07/09/2020, 4:16 PM  Clinical Narrative: Patient admitted on 06/29/2020 [place] the [place] and hitting his [place]; he sustained left occipital parietotemporal calvarial fracture, left temporal contusion, bilateral frontal traumatic subarachnoid hemorrhage and contusions/subdural hematomas with cerebral edema.  Prior to admission patient independent; lives at home with wife.  Patient has remained intubated with no improvement in his mental status with discontinuation of sedation.  Family has chosen to have tracheostomy performed today; plan PEG placement on Monday.  May consider LTAC placement if patient unable to wean from the ventilator versus SNF placement.  Will follow as patient progresses.              Expected Discharge Plan: Long Term Acute Care (LTAC) Barriers to Discharge: Continued Medical Work up          Expected Discharge Plan and Services Expected Discharge Plan: Long Term Acute Care (LTAC)   Discharge Planning Services: CM Consult   Living arrangements for the past 2 months: Single Family Home                                      Prior Living Arrangements/Services Living arrangements for the past 2 months: Single Family Home Lives with:: Spouse                   Activities of Daily Living      Permission Sought/Granted                  Emotional Assessment Appearance:: Appears stated age Attitude/Demeanor/Rapport: Unable to Assess Affect (typically observed): Unable to Assess        Admission diagnosis:  Subarachnoid hemorrhage following injury (HCC) [S06.6X9A] ICH (intracerebral hemorrhage) (HCC) [I61.9] Traumatic hemorrhage of cerebrum with loss of consciousness of 30 minutes or less, unspecified laterality,  initial encounter [facility]) [Z85.885O] Patient Active Problem List   Diagnosis Date Noted  . Traumatic hemorrhage of cerebrum with loss of consciousness of 30 minutes or less (HCC)   . Tachypnea   . Encephalopathy acute 06/29/2020  . Subarachnoid hemorrhage following injury (HCC) 06/29/2020   PCP:  Patient, No Pcp Per Pharmacy:   [facility] [phone] - [place], [place] - [address] [place] [address] Phone: [phone] Fax: [phone]     Social Determinants of Health (SDOH) Interventions    Readmission Risk Interventions No flowsheet data found.   Quintella Baton, RN, BSN  Trauma/Neuro ICU Case Manager [phone]

## 2020-07-09 NOTE — Progress Notes (Signed)
SLP Cancellation Note  Patient Details Name: Harold Mckee MRN: [id number] DOB: April 10, 1946   Cancelled treatment:       Reason Eval/Treat Not Completed: Patient not medically ready Patient with new tracheostomy. Orders for SLP eval and treat for PMSV and swallowing received. Will follow pt closely for readiness for SLP interventions as appropriate.   [name], MA CCC-SLP  Acute Rehabilitation Services Pager [phone] Office [phone]    [name] 07/09/2020, 3:33 PM

## 2020-07-09 NOTE — Progress Notes (Signed)
NAME:  Harold Mckee, MRN:  [id number], DOB:  [birth year], LOS: 10 ADMISSION DATE:  06/29/2020, CONSULTATION DATE:  06/29/2020 REFERRING MD:  Dr. Jake Samples, CHIEF COMPLAINT:  Bilateral subarachnoid/subdural hematomas   Brief History    26 y o male with HTN and Diabetes who presented after ground level fall resulting in head injury while walking his dog in the park. Per EMS patient was seen drinking alcohol prior to fall.   On arrival patient was seen bleeding from his left ear. Vitals on arrival significant for mild tachypnea and hypertension. Labwork significant for hyperglycemia and mild leukocytosis. CT C-spin and head positive for  left scull fracture and bilateral subarachnoid/subdural hematomas. PCCM consulted for assistance ventilator managment  Past Medical History  HTN  Diabetes   Significant Hospital Events   Admitted 7/13  7.15  - Started on empiric cefepime 7/14 due to fever and leukocytosis.  PCT 1.06 and lactate 5.9.   Consults:  Neurosurgery   ENT 7/15 - Dr Suszanne Conners - >  Left longitudinal temporal bone fracture with possible separation of the malleus and incus. - Cotton ball packing of the left ear canal. Change cotton ball as needed.  The T-bone CT reviewed. No obvious otic capsule involvement.   No acute ENT intervention needed at this time. - The patient will need hearing test as an outpatient in my office. My need middle ear exploration if significant conductive hearing loss is noted.   Procedures:    Significant Diagnostic Tests:  CT C-spine and head 7/13 > 1. Nondisplaced fracture of the left parietal calvarium along the left lambdoid suture extending into the left occipital calvarium and left temporal bone with involvement of the left mastoid air cell and external auditory canal. Dedicated temporal bone CT is recommended for further evaluation. 2. Bilateral subdural and subarachnoid hemorrhages. No midline shift. 3. Focal hemorrhage with small pockets of  pneumocephalus in the left posterior temporal lobe along the calvarial fracture. This likely represents combination of subarachnoid and intraparenchymal hemorrhage. 4. No acute/traumatic cervical spine pathology. CT head 7/14 > blooming contusions in bilateral inferior frontal, bilateral, inferior temporal, and left parieto-occipital lobes.  Generalized increased in SAH, mild IVH without hydrocephalus.  Progressive scalp swelling over left calvarial fracture.  No pneumocephalus.  CT temporal bones 7/14 > longitudinal left temporal bone fx.  Hemotympanum  On left with pneuocephalus and soft tissue emphysema.  EEG 7/14 > mod diffuse encephalopathy.  No seizures or epileptiform activity noted. Head CT 17 >> no change, extensive hemorrhagic contusions in the frontal lobes bilaterally, bifrontal subdural hematoma, diffuse subarachnoid hemorrhage, IVH without hydrocephalus  EEG 7/19 >> Moderate to severe encephalopathy, nonspecific.  No seizures or epileptiform discharges seen  Micro Data:  COVID 7/13 > negative  Blood 7/14 > negative Respiratory 7/16 >> normal flora  7/22 >> Blood 7/16 >> negative  Antimicrobials:  Cefepime 7/13 > 7/20 Vanco 7/16 >>  7/20  Interim history/subjective:  Patient is afebrile now, respiratory culture was sent yesterday because he has thick greenish secretion via trach Still minimally responsive and encephalopathic  Objective   Blood pressure 109/76, pulse (!) 127, temperature 99.7 F (37.6 C), temperature source Axillary, resp. rate 22, height 6\' 1"  (1.854 m), weight 70 kg, SpO2 97 %.    Vent Mode: PRVC FiO2 (%):  [30 %] 30 % Set Rate:  [16 bmp] 16 bmp Vt Set:  [630 mL] 630 mL PEEP:  [2 cmH20-5 cmH20] 5 cmH20 Pressure Support:  [15 cmH20] 15 cmH20 Plateau Pressure:  [  17 cmH20-22 cmH20] 17 cmH20   Intake/Output Summary (Last 24 hours) at 07/09/2020 1030 Last data filed at 07/09/2020 1950 Gross per 24 hour  Intake 1780 ml  Output 2550 ml  Net -770 ml    Filed Weights   07/06/20 0500 07/07/20 0500 07/08/20 0500  Weight: 70 kg 70.2 kg 70 kg    Examination: General: Chronically ill looking male, orally intubated Head: Normocephalic Ears: Cotton left ear, no active bleeding Nose: NG tube in place Neck: Normal Lungs: Reduced air entry at the bases, no wheeze, no crackles Heart: Tachycardic with heart rate between 100-120, regular rhythm, no murmur Abdomen: Distended with positive bowel sounds Extremities: No edema Skin: No rash Neurologic: Does not open eyes, not following commands.  Withdraws in bilateral lower extremities, minimal right upper extremity movement.  Gaze alternate between right and left intermittently Assessment & Plan:   Acute hypoxic respiratory Failure Patient failed spontaneous breathing trial, vent was adjusted to prevent respiratory distress, he became tachypneic and tachycardic now switch back to [facility] mode on ventilator  I had detailed discussion with family yesterday, they would like to proceed with tracheostomy and PEG tube placement.  He is a scheduled for trach today Respiratory culture is pending Pulmonary hygiene VAP prevention order set  Mechanical fall resulting in Left skull fracture and bilateral subarachnoid/subdural hemorrhage Received 3% saline for first few days, now off CT scan showed severe bifrontal contusion/intraparenchymal hemorrhage  Persistent acute traumatic encephalopathy.  He has not shown any improvement in mental status with time, discontinuation sedation Personally unresponsive EEG ruled out acute seizure Continue empiric Keppra  Continue goals of care discussion with the family per primary team  Left longitudinal temporal bone fracture with possible separation of the malleus and incus. -Appreciate Dr. Avel Sensor input and assistance.  No indication for surgery at this time but he will need follow-up, assessment of his hearing, possible middle ear exploration in the future depending  on status  Hypertension, uncontrolled Continue Amlodipine, HCTZ, metoprolol Hydralazine and labetalol available as needed  Diabetes type 2, uncontrolled with hyperglycemia Continue sliding scale and Levemir 26 units twice daily Tube feed coverage 5 units  Sepsis (PAO) - unclear etiology at this point.  No clear source, consider fever neurological P:  Patient's fever has broke, he remained afebrile for more than 24 hours  He was on vancomycin and cefepime, which was discontinued yesterday after 7 days therapy This could be central fever Follow-up respiratory culture Unable to determine if sepsis was ruled out  Alcohol intoxication. - ETOH level 26 on arrival, unsure if this is acute vs chronic P: Completed thiamine and folate supplementation  Electrolyte imbalance Monitor electrolytes and replace  Best practice:  Diet: NPO for trach today Pain/Anxiety/Delirium protocol (if indicated): PRN  VAP protocol (if indicated): Yes DVT prophylaxis: Subcu heparin GI prophylaxis: PPI Glucose control: SSI/Levemir Mobility: Bedrest  Code Status: Full Family Communication: Per primary service Disposition: ICU   LABS    PULMONARY No results for input(s): PHART, PCO2ART, PO2ART, HCO3, TCO2, O2SAT in the last 168 hours.  Invalid input(s): PCO2, PO2  CBC Recent Labs  Lab 07/06/20 0632 07/07/20 0407 07/07/20 1058  HGB 10.1* 10.4* 9.9*  HCT 31.2* 32.6* 31.3*  WBC 11.5* 11.7* 11.6*  PLT 217 230 248    COAGULATION No results for input(s): INR in the last 168 hours.  CARDIAC  No results for input(s): TROPONINI in the last 168 hours. No results for input(s): PROBNP in the last 168 hours.   CHEMISTRY  Recent Labs  Lab 07/03/20 1311 07/03/20 2234 07/04/20 0322 07/04/20 0322 07/04/20 1453 07/04/20 2232 07/05/20 0805 07/05/20 0805 07/06/20 0632 07/07/20 0407 07/07/20 1058  NA 140   < > 145   < > 144 145 147*  --  146* 146*  --   K 4.1  --  4.7   < >  --   --  4.2   <  > 4.1 4.5  --   CL 109  --  112*  --   --   --  113*  --  113* 109  --   CO2 20*  --  21*  --   --   --  22  --  23 24  --   GLUCOSE 279*  --  308*  --   --   --  330*  --  268* 308*  --   BUN 20  --  24*  --   --   --  30*  --  33* 32*  --   CREATININE 0.77  --  0.80  --   --   --  1.01  --  1.06 1.00 1.14  CALCIUM 9.4  --  9.6  --   --   --  10.1  --  10.0 10.1  --   MG 2.1  --   --   --   --   --   --   --   --   --   --   PHOS 2.0*  --  2.5  --   --   --  3.2  --   --   --   --    < > = values in this interval not displayed.   Estimated Creatinine Clearance: 55.4 mL/min (by C-G formula based on SCr of 1.14 mg/dL).   LIVER No results for input(s): AST, ALT, ALKPHOS, BILITOT, PROT, ALBUMIN, INR in the last 168 hours.   INFECTIOUS Recent Labs  Lab 07/05/20 0805 07/06/20 0632  PROCALCITON 1.75 1.60     ENDOCRINE CBG (last 3)  Recent Labs    07/08/20 2311 07/09/20 0333 07/09/20 0744  GLUCAP 224* 197* 170*    Total critical care time: 32 minutes  Performed by: Cheri Fowler   Critical care time was exclusive of separately billable procedures and treating other patients.   Critical care was necessary to treat or prevent imminent or life-threatening deterioration.   Critical care was time spent personally by me on the following activities: development of treatment plan with patient and/or surrogate as well as nursing, discussions with consultants, evaluation of patient's response to treatment, examination of patient, obtaining history from patient or surrogate, ordering and performing treatments and interventions, ordering and review of laboratory studies, ordering and review of radiographic studies, pulse oximetry and re-evaluation of patient's condition.   Cheri Fowler MD Critical care physician [facility] [place] [facility]  Pager: [phone] Mobile: [phone]

## 2020-07-09 NOTE — Procedures (Signed)
Diagnostic Bronchoscopy  Harold Mckee  [id number]  1946/07/17  Date:07/09/20  Time:2:48 PM   Provider Performing:Yeison Sippel   Procedure: Diagnostic Bronchoscopy (99833)  Indication(s) Assist with direct visualization of tracheostomy placement  Consent Risks of the procedure as well as the alternatives and risks of each were explained to the patient and/or caregiver.  Consent for the procedure was obtained.   Anesthesia See separate tracheostomy note   Time Out Verified patient identification, verified procedure, site/side was marked, verified correct patient position, special equipment/implants available, medications/allergies/relevant history reviewed, required imaging and test results available.   Sterile Technique Usual hand hygiene, masks, gowns, and gloves were used   Procedure Description Bronchoscope advanced through endotracheal tube and into airway.  After suctioning out tracheal secretions, bronchoscope used to provide direct visualization of tracheostomy placement.   Complications/Tolerance None; patient tolerated the procedure well.   EBL None  Specimen(s) None  Diagnostic Bronchoscopy  Harold Mckee  [id number]  04-23-1946  Date:07/09/20  Time:2:48 PM   Provider Performing:Ajahni Nay   Procedure: Diagnostic Bronchoscopy (73419)  Indication(s) Assist with direct visualization of tracheostomy placement  Consent Risks of the procedure as well as the alternatives and risks of each were explained to the patient and/or caregiver.  Consent for the procedure was obtained.   Anesthesia See separate tracheostomy note   Time Out Verified patient identification, verified procedure, site/side was marked, verified correct patient position, special equipment/implants available, medications/allergies/relevant history reviewed, required imaging and test results available.   Sterile Technique Usual hand hygiene, masks, gowns, and gloves were  used   Procedure Description Bronchoscope advanced through endotracheal tube and into airway.  After suctioning out tracheal secretions, bronchoscope used to provide direct visualization of tracheostomy placement.   Complications/Tolerance None; patient tolerated the procedure well.   EBL None  Specimen(s) None  Harold Catalan, MD [facility] ICU Physician [facility] [place] [facility]  Pager: [phone] Mobile: [phone] After hours: [phone].  07/09/2020, 2:49 PM

## 2020-07-09 NOTE — Progress Notes (Signed)
Subjective:                  Intubated,off sedation, no issues overnight  Objective: PE: Intubated Eyesopen slightly to pain Gaze midline Pupils equally round reactive to light, brisk Face symmetric, winces to pain Min movement to pain BUE/BLE   Assessment/Plan: 1. Left occipital parietotemporal calvarial fracture secondary to fall 2.Left temporal contusion 3.Bilateral frontal traumatic subarachnoidhemorrhage & contusions/subdural hematomaswith cerebral edema 4.TBI, severe 5. Hyponatremia, resolved   -neuro ICU -Q2hr neurochecks -Keppra 1000 mg twice daily(EEG negx2) -N.p.o., coretrak feeds -Blood pressure control -ok for SQH -CT brain stable 7/20 -trach today per CCM -will consult Gen surg for PEG -continue close monitoring and supportive care  Please do no hesitate to call with questions or concerns  Monia Pouch, DO Neurosurgeon [facility] Cell: [phone]

## 2020-07-10 DIAGNOSIS — J9601 Acute respiratory failure with hypoxia: Secondary | ICD-10-CM

## 2020-07-10 LAB — POCT I-STAT 7, (LYTES, BLD GAS, ICA,H+H)
Acid-Base Excess: 4 mmol/L — ABNORMAL HIGH (ref 0.0–2.0)
Bicarbonate: 26.1 mmol/L (ref 20.0–28.0)
Calcium, Ion: 1.36 mmol/L (ref 1.15–1.40)
HCT: 29 % — ABNORMAL LOW (ref 39.0–52.0)
Hemoglobin: 9.9 g/dL — ABNORMAL LOW (ref 13.0–17.0)
O2 Saturation: 95 %
Potassium: 4.1 mmol/L (ref 3.5–5.1)
Sodium: 147 mmol/L — ABNORMAL HIGH (ref 135–145)
TCO2: 27 mmol/L (ref 22–32)
pCO2 arterial: 29.2 mmHg — ABNORMAL LOW (ref 32.0–48.0)
pH, Arterial: 7.56 — ABNORMAL HIGH (ref 7.350–7.450)
pO2, Arterial: 62 mmHg — ABNORMAL LOW (ref 83.0–108.0)

## 2020-07-10 LAB — CBC
HCT: 31.6 % — ABNORMAL LOW (ref 39.0–52.0)
Hemoglobin: 9.8 g/dL — ABNORMAL LOW (ref 13.0–17.0)
MCH: 28.2 pg (ref 26.0–34.0)
MCHC: 31 g/dL (ref 30.0–36.0)
MCV: 90.8 fL (ref 80.0–100.0)
Platelets: UNDETERMINED 10*3/uL (ref 150–400)
RBC: 3.48 MIL/uL — ABNORMAL LOW (ref 4.22–5.81)
RDW: 14.3 % (ref 11.5–15.5)
WBC: 16.3 10*3/uL — ABNORMAL HIGH (ref 4.0–10.5)
nRBC: 3.7 % — ABNORMAL HIGH (ref 0.0–0.2)

## 2020-07-10 LAB — CULTURE, RESPIRATORY W GRAM STAIN

## 2020-07-10 LAB — BASIC METABOLIC PANEL
Anion gap: 13 (ref 5–15)
BUN: 53 mg/dL — ABNORMAL HIGH (ref 8–23)
CO2: 25 mmol/L (ref 22–32)
Calcium: 10.5 mg/dL — ABNORMAL HIGH (ref 8.9–10.3)
Chloride: 106 mmol/L (ref 98–111)
Creatinine, Ser: 1.2 mg/dL (ref 0.61–1.24)
GFR calc Af Amer: >60 mL/min (ref >60)
GFR calc non Af Amer: 59 mL/min — ABNORMAL LOW (ref >60)
Glucose, Bld: 326 mg/dL — ABNORMAL HIGH (ref 70–99)
Potassium: 4.1 mmol/L (ref 3.5–5.1)
Sodium: 144 mmol/L (ref 135–145)

## 2020-07-10 LAB — GLUCOSE, CAPILLARY
Glucose-Capillary: 269 mg/dL — ABNORMAL HIGH (ref 70–99)
Glucose-Capillary: 258 mg/dL — ABNORMAL HIGH (ref 70–99)
Glucose-Capillary: 228 mg/dL — ABNORMAL HIGH (ref 70–99)
Glucose-Capillary: 215 mg/dL — ABNORMAL HIGH (ref 70–99)
Glucose-Capillary: 204 mg/dL — ABNORMAL HIGH (ref 70–99)
Glucose-Capillary: 182 mg/dL — ABNORMAL HIGH (ref 70–99)

## 2020-07-10 LAB — MAGNESIUM: Magnesium: 2.1 mg/dL (ref 1.7–2.4)

## 2020-07-10 LAB — PHOSPHORUS: Phosphorus: 3.2 mg/dL (ref 2.5–4.6)

## 2020-07-10 LAB — PROCALCITONIN: Procalcitonin: 1.81 ng/mL

## 2020-07-10 MED ORDER — INSULIN ASPART 100 UNIT/ML ~~LOC~~ SOLN
8.0000 [IU] | SUBCUTANEOUS | Status: DC
  Administered 2020-07-10 – 2020-07-19 (×49): 8 [IU] via SUBCUTANEOUS

## 2020-07-10 NOTE — Progress Notes (Signed)
SLP Cancellation Note  Patient Details Name: Harold Mckee MRN: [id number] DOB: [birth year]   Cancelled treatment:       Reason Eval/Treat Not Completed: Patient not medically ready.  Pt remains on the vent at this time.  Spoke with RN and will f/u for evaluations as appropriate.     Shanon Rosser Kamran Coker 07/10/2020, 9:08 AM

## 2020-07-10 NOTE — Progress Notes (Addendum)
NAME:  Harold Mckee, MRN:  [id number], DOB:  July 04, 1946, LOS: 11 ADMISSION DATE:  06/29/2020, CONSULTATION DATE:  06/29/2020 REFERRING MD:  Dr. Jake Samples, CHIEF COMPLAINT:  Bilateral subarachnoid/subdural hematomas   Brief History   74 y o male with HTN and Diabetes who presented after ground level fall resulting in head injury while [place] his [place] in the park. Per EMS patient was seen drinking alcohol prior to fall.   On arrival patient was seen bleeding from his left ear. Vitals on arrival significant for mild tachypnea and hypertension. Labwork significant for hyperglycemia and mild leukocytosis. CT C-spin and head positive for  left scull fracture and bilateral subarachnoid/subdural hematomas. PCCM consulted for assistance ventilator managment  Past Medical History  HTN  Diabetes   Significant Hospital Events   Admitted 7/13  7.15 > Started on empiric cefepime 7/14 due to fever and leukocytosis.  PCT 1.06 and lactate 5.9.   Consults:  Neurosurgery   ENT 7/15 - Dr Suszanne Conners > Left longitudinal temporal bone fracture with possible separation of the malleus and incus - Cotton ball packing of the left ear canal. Change cotton ball as needed.  The T-bone CT reviewed. No obvious otic capsule involvement.   No acute ENT intervention needed at this time. - The patient will need hearing test as an outpatient in my office. My need middle ear exploration if significant conductive hearing loss is noted.   Procedures:    Significant Diagnostic Tests:  CT C-spine and head 7/13 > 1. Nondisplaced fracture of the left parietal calvarium along the left lambdoid suture extending into the left occipital calvarium and left temporal bone with involvement of the left mastoid air cell and external auditory canal. Dedicated temporal bone CT is recommended for further evaluation. 2. Bilateral subdural and subarachnoid hemorrhages. No midline shift. 3. Focal hemorrhage with small pockets of pneumocephalus  in the left posterior temporal lobe along the calvarial fracture. This likely represents combination of subarachnoid and intraparenchymal hemorrhage. 4. No acute/traumatic cervical spine pathology. CT head 7/14 > blooming contusions in bilateral inferior frontal, bilateral, inferior temporal, and left parieto-occipital lobes.  Generalized increased in SAH, mild IVH without hydrocephalus.  Progressive scalp swelling over left calvarial fracture.  No pneumocephalus.  CT temporal bones 7/14 > longitudinal left temporal bone fx.  Hemotympanum  On left with pneuocephalus and soft tissue emphysema.  EEG 7/14 > mod diffuse encephalopathy.  No seizures or epileptiform activity noted. Head CT 17 >> no change, extensive hemorrhagic contusions in the frontal lobes bilaterally, bifrontal subdural hematoma, diffuse subarachnoid hemorrhage, IVH without hydrocephalus  EEG 7/19 >> Moderate to severe encephalopathy, nonspecific.  No seizures or epileptiform discharges seen  Micro Data:  COVID 7/13 > negative  Blood 7/14 > negative Respiratory 7/16 >> normal flora  7/22 >> Blood 7/16 >> negative Sputum culture 7/22 > few Klebsiella pneumoniae, susceptibilities pending   Antimicrobials:  Cefepime 7/13 > 7/20 Vanco 7/16 >>  7/20  Interim history/subjective:  Per chart review patient again spiked temp overnight with t-max 103.2. Remains minimally responsive  No acute events overnight   Objective   Blood pressure 121/74, pulse (!) 114, temperature (!) 101 F (38.3 C), temperature source Oral, resp. rate 23, height 6\' 1"  (1.854 m), weight 70 kg, SpO2 100 %.    Vent Mode: PRVC FiO2 (%):  [30 %] 30 % Set Rate:  [16 bmp] 16 bmp Vt Set:  [630 mL] 630 mL PEEP:  [5 cmH20] 5 cmH20 Plateau Pressure:  [17 cmH20-22 cmH20]  22 cmH20   Intake/Output Summary (Last 24 hours) at 07/10/2020 0734 Last data filed at 07/10/2020 0700 Gross per 24 hour  Intake 1484.67 ml  Output 2225 ml  Net -740.33 ml   Filed  Weights   07/06/20 0500 07/07/20 0500 07/08/20 0500  Weight: 70 kg 70.2 kg 70 kg    Examination: General: Chronically ill appearing elderly deconditioned male lying in bed in NAD HEENT: 6 cuffed shiley trach midline, MM pink/moist, PERRL, sclera non-icteric  Neuro: eyes spontaneously open but will respond to painful stimuli only  CV: Slight tachycardia, s1s2 regular rate and rhythm, no murmur, rubs, or gallops,  PULM:  Clear to ascultation bilaterally, oxygen saturations 100% on 30 Fio2, tolerating vent well  GI: soft, bowel sounds active in all 4 quadrants, non-tender, non-distended, tolerating TF Extremities: warm/dry, no edema  Skin: no rashes or lesions  Assessment & Plan:   Acute hypoxic respiratory Failure -In the setting of bilateral SAH/SDH -S/P percutaneous tracheostomy tube placement 7/24 P: Continue ventilator support with lung protective strategies  Wean PEEP and FiO2 for sats greater than 90%. Goal for ATC trials Routine trach care  Head of bed elevated 30 degrees. Plateau pressures less than 30 cm H20.  Follow intermittent chest x-ray and ABG.   Ensure adequate pulmonary hygiene  Follow cultures, Sputum culture with few Klebsiella pneumoniae VAP bundle in place  PAD protocol  Mechanical fall resulting in Left skull fracture and bilateral subarachnoid/subdural hemorrhage -Received 3% saline for first few days, now off -CT scan showed severe bifrontal contusion/intraparenchymal hemorrhage P: Management per neurosurgery  Maintain neuro protective measures; eurothermia, euglycemia, eunatermia, normoxia Nutrition and bowel regiment  Seizure precautions  AEDs per neurology   Fever -unclear etiology at this point.  No clear source, consider fever neurological -Lung sounds remain clear, vent setting low, and sputum production also low -S/P 7 days of IV Vanc and Cefepime  P:  Continued to intermittently fever Tmax overnight 103.2 Respiratory culture from 7/22  with few Klebsiella pneumoniae No acute indication for further antibiotics at this time Trend CBC and fever curve, CBC pending this AM Obtain procalcitonin Follow sputum culture Low threshold to start IV antibiotics if leukocytosis and elevated procal seen on AM labs  PRN antipyretics   Persistent acute traumatic encephalopathy.   -He has not shown any improvement in mental status with time, discontinuation sedation -EEG ruled out acute seizure P: Continue empiric Keppra  Supportive care  Minimize sedation   Left longitudinal temporal bone fracture with possible separation of the malleus and incus. P: Seen by Dr. Avel Sensor ENT appreciate input and assistance.  No indication for surgery at this time but he will need follow-up, assessment of his hearing, possible middle ear exploration in the future depending on status  Hypertension, uncontrolled -Currently well controlled on current regiment  P: Continue Amlodipine, HCTZ, and metroprolol PRN hydralazine and Labetalol as needed   Diabetes type 2, uncontrolled with hyperglycemia P: Continue resistant SSI scale Continue Levemir 26units BID Will increase tiube feed coverage to 8 units q4hrs CBG check q4hrs   Alcohol intoxication.  - ETOH level 26 on arrival, unsure if this is acute vs chronic P: Completed thiamine and folate supplementation   Best practice:  Diet: NPO for trach today Pain/Anxiety/Delirium protocol (if indicated): PRN  VAP protocol (if indicated): Yes DVT prophylaxis: Subcu heparin GI prophylaxis: PPI Glucose control: SSI/Levemir Mobility: Bedrest  Code Status: Full Family Communication: Per primary service Disposition: ICU  LABS    PULMONARY Recent Labs  Lab 07/10/20 0355  PHART 7.560*  PCO2ART 29.2*  PO2ART 62*  HCO3 26.1  TCO2 27  O2SAT 95.0    CBC Recent Labs  Lab 07/06/20 0632 07/06/20 0632 07/07/20 0407 07/07/20 1058 07/10/20 0355  HGB 10.1*   < > 10.4* 9.9* 9.9*  HCT 31.2*   < >  32.6* 31.3* 29.0*  WBC 11.5*  --  11.7* 11.6*  --   PLT 217  --  230 248  --    < > = values in this interval not displayed.    COAGULATION Recent Labs  Lab 07/09/20 1145  INR 1.2    CARDIAC  No results for input(s): TROPONINI in the last 168 hours. No results for input(s): PROBNP in the last 168 hours.   CHEMISTRY Recent Labs  Lab 07/03/20 1311 07/03/20 2234 07/04/20 0322 07/04/20 0322 07/04/20 1453 07/04/20 2232 07/05/20 0805 07/05/20 0805 07/06/20 8182 07/06/20 9937 07/07/20 0407 07/07/20 1058 07/10/20 0355  NA 140   < > 145  --    < > 145 147*  --  146*  --  146*  --  147*  K 4.1  --  4.7   < >  --   --  4.2   < > 4.1   < > 4.5  --  4.1  CL 109  --  112*  --   --   --  113*  --  113*  --  109  --   --   CO2 20*  --  21*  --   --   --  22  --  23  --  24  --   --   GLUCOSE 279*  --  308*  --   --   --  330*  --  268*  --  308*  --   --   BUN 20  --  24*  --   --   --  30*  --  33*  --  32*  --   --   CREATININE 0.77  --  0.80  --   --   --  1.01  --  1.06  --  1.00 1.14  --   CALCIUM 9.4  --  9.6  --   --   --  10.1  --  10.0  --  10.1  --   --   MG 2.1  --   --   --   --   --   --   --   --   --   --   --   --   PHOS 2.0*  --  2.5  --   --   --  3.2  --   --   --   --   --   --    < > = values in this interval not displayed.   Estimated Creatinine Clearance: 55.4 mL/min (by C-G formula based on SCr of 1.14 mg/dL).   LIVER Recent Labs  Lab 07/09/20 1145  INR 1.2     INFECTIOUS Recent Labs  Lab 07/05/20 0805 07/06/20 0632  PROCALCITON 1.75 1.60     ENDOCRINE CBG (last 3)  Recent Labs    07/09/20 1911 07/09/20 2312 07/10/20 0319  GLUCAP 99 252* 269*    Total critical care time: 38 minutes  Performed by: Cheri Fowler   Critical care time was exclusive of separately billable procedures and treating other patients.   Critical care was necessary to  treat or prevent imminent or life-threatening deterioration.   Critical care was time  spent personally by me on the following activities: development of treatment plan with patient and/or surrogate as well as nursing, discussions with consultants, evaluation of patient's response to treatment, examination of patient, obtaining history from patient or surrogate, ordering and performing treatments and interventions, ordering and review of laboratory studies, ordering and review of radiographic studies, pulse oximetry and re-evaluation of patient's condition.   Delfin GantWhitney F Jame Morrell, NP-C [facility] Contact / Pager information can be found on Amion  07/10/2020, 8:13 AM

## 2020-07-10 NOTE — Progress Notes (Signed)
Patient ID: Harold Mckee, male   DOB: [birth year], 74 y.o.   MRN: [id number] BP (!) 134/77   Pulse (!) 114   Temp (!) 100.8 F (38.2 C) (Axillary)   Resp 22   Ht 6\' 1"  (1.854 m)   Wt 70 kg   SpO2 98%   BMI 20.36 kg/m  Ventilated, on trach Opens eyes to noxious stimuli Not following commands No real change neurologically Perrl, +cough

## 2020-07-10 NOTE — Evaluation (Signed)
Physical Therapy Evaluation Patient Details Name: Harold Mckee MRN: [id number] DOB: 22-Jul-1946 Today's Date: 07/10/2020   History of Present Illness  74 y o male with HTN and Diabetes who presented after fall resulting in head injury while walking his dog in the park. Per EMS patient was seen drinking alcohol prior to fall. imaging revealing L skull fracture, Bilat frontal SAH/SDH hematomas, and severe TBI. Pt was trached on 7/23, remains on vent. Planned for PEG tube on Monday 7/26.    Clinical Impression  Pt admitted with above. Pt was indep and living with spouse PTA. Pt now unresponsive, despite eyes open. Pt with no tracking of eyes, no voluntary movement of extremities x4, no command follow, requiring totalx2 for all mobility. Maintained EOB x 3 min but dependent on posterior support. Pt with no change in vitals when transferred to EOB. Pt with noted tinged tan saliva coming from mouth, RN notified. Pt remains on vent so will recommend LTACH at this time. Acute PT to cont to follow.    Follow Up Recommendations LTACH (until pt can wean off vent)    Equipment Recommendations  None recommended by PT (TBD at next venue)    Recommendations for Other Services       Precautions / Restrictions Precautions Precautions: Fall Precaution Comments: unresponsive Restrictions Weight Bearing Restrictions: No      Mobility  Bed Mobility Overal bed mobility: Needs Assistance Bed Mobility: Rolling;Sidelying to Sit;Sit to Supine Rolling: Total assist;+2 for physical assistance Sidelying to sit: Total assist;+2 for physical assistance   Sit to supine: Total assist;+2 for physical assistance   General bed mobility comments: pt with no assist with transfers, completely dependent for trunk and LE management  Transfers                 General transfer comment: would need to be hoyer lifted to chair at this time  Ambulation/Gait                Stairs             Wheelchair Mobility    Modified Rankin (Stroke Patients Only) Modified Rankin (Stroke Patients Only) Pre-Morbid Rankin Score: No symptoms Modified Rankin: Severe disability     Balance Overall balance assessment: Needs assistance Sitting-balance support: Feet unsupported;Bilateral upper extremity supported Sitting balance-Leahy Scale: Zero Sitting balance - Comments: pt dependnet on posterior support from PT tech to maintain EOB balance, pt with no righting or reactive response with LOB                                     Pertinent Vitals/Pain Pain Assessment: Faces Faces Pain Scale: Hurts a little bit Pain Location: grimacing with noxious stimuli x 4 extremities Pain Descriptors / Indicators: Grimacing    Home Living Family/patient expects to be discharged to:: Private residence Living Arrangements: Spouse/significant other               Additional Comments: pt poor historian as pt non-responsive, no command follow. Per chart pt was indep and living with spouse    Prior Function Level of Independence: Independent         Comments: was walking dog, per chart independent     Hand Dominance        Extremity/Trunk Assessment   Upper Extremity Assessment Upper Extremity Assessment: Difficult to assess due to impaired cognition (no active mvmt bilat UE, full PROM)  Lower Extremity Assessment Lower Extremity Assessment: Difficult to assess due to impaired cognition (no active mvmt bilat LE, full PROM)    Cervical / Trunk Assessment Cervical / Trunk Assessment: Other exceptions (unable to hold head up at EOB)  Communication   Communication: Expressive difficulties;Receptive difficulties (pt not speaking or following commands)  Cognition Arousal/Alertness:  (eyes open but non-responsive) Behavior During Therapy: Flat affect Overall Cognitive Status: Impaired/Different from baseline Area of Impairment: Following commands                        Following Commands: Follows one step commands inconsistently       General Comments: pt non-verbal, doesn't use shaking of head and can not do yes/no via thumbs up/down to communicate. Pt with no command follow at all. Pt with blink to threat only with bilat eyes      General Comments General comments (skin integrity, edema, etc.): pt with trach and NG tube, VSS, no change with position    Exercises     Assessment/Plan    PT Assessment Patient needs continued PT services  PT Problem List Decreased strength;Decreased activity tolerance;Decreased balance;Decreased mobility;Decreased coordination;Decreased cognition;Decreased knowledge of use of DME;Decreased safety awareness;Decreased knowledge of precautions       PT Treatment Interventions DME instruction;Gait training;Stair training;Functional mobility training;Therapeutic activities;Therapeutic exercise;Balance training;Neuromuscular re-education;Cognitive remediation;Patient/family education;Wheelchair mobility training    PT Goals (Current goals can be found in the Care Plan section)  Acute Rehab PT Goals PT Goal Formulation: With patient Time For Goal Achievement: 07/24/20 Potential to Achieve Goals: Fair    Frequency Min 2X/week   Barriers to discharge        Co-evaluation               AM-PAC PT "6 Clicks" Mobility  Outcome Measure Help needed turning from your back to your side while in a flat bed without using bedrails?: Total Help needed moving from lying on your back to sitting on the side of a flat bed without using bedrails?: Total Help needed moving to and from a bed to a chair (including a wheelchair)?: Total Help needed standing up from a chair using your arms (e.g., wheelchair or bedside chair)?: Total Help needed to walk in hospital room?: Total Help needed climbing 3-5 steps with a railing? : Total 6 Click Score: 6    End of Session Equipment Utilized During Treatment:  Oxygen Activity Tolerance: Patient tolerated treatment well Patient left: in bed;with call bell/phone within reach (left in chair position) Nurse Communication: Mobility status (drainage color tinged like tube feed from mouth) PT Visit Diagnosis: Muscle weakness (generalized) (M62.81);History of falling (Z91.81);Difficulty in walking, not elsewhere classified (R26.2)    Time: 1027-2536 PT Time Calculation (min) (ACUTE ONLY): 18 min   Charges:   PT Evaluation $PT Eval Moderate Complexity: 1 Mod          Lewis Shock, PT, DPT Acute Rehabilitation Services Pager #: [phone] Office #: [phone]   Iona Hansen 07/10/2020, 9:06 AM

## 2020-07-11 DIAGNOSIS — Z930 Tracheostomy status: Secondary | ICD-10-CM

## 2020-07-11 LAB — CBC
HCT: 29.5 % — ABNORMAL LOW (ref 39.0–52.0)
Hemoglobin: 9.3 g/dL — ABNORMAL LOW (ref 13.0–17.0)
MCH: 28.2 pg (ref 26.0–34.0)
MCHC: 31.5 g/dL (ref 30.0–36.0)
MCV: 89.4 fL (ref 80.0–100.0)
Platelets: 243 10*3/uL (ref 150–400)
RBC: 3.3 MIL/uL — ABNORMAL LOW (ref 4.22–5.81)
RDW: 13.9 % (ref 11.5–15.5)
WBC: 16.5 10*3/uL — ABNORMAL HIGH (ref 4.0–10.5)
nRBC: 6.4 % — ABNORMAL HIGH (ref 0.0–0.2)

## 2020-07-11 LAB — BASIC METABOLIC PANEL
Anion gap: 13 (ref 5–15)
BUN: 50 mg/dL — ABNORMAL HIGH (ref 8–23)
CO2: 26 mmol/L (ref 22–32)
Calcium: 10.5 mg/dL — ABNORMAL HIGH (ref 8.9–10.3)
Chloride: 112 mmol/L — ABNORMAL HIGH (ref 98–111)
Creatinine, Ser: 0.87 mg/dL (ref 0.61–1.24)
GFR calc Af Amer: >60 mL/min (ref >60)
GFR calc non Af Amer: >60 mL/min (ref >60)
Glucose, Bld: 198 mg/dL — ABNORMAL HIGH (ref 70–99)
Potassium: 3.8 mmol/L (ref 3.5–5.1)
Sodium: 151 mmol/L — ABNORMAL HIGH (ref 135–145)

## 2020-07-11 LAB — GLUCOSE, CAPILLARY
Glucose-Capillary: 164 mg/dL — ABNORMAL HIGH (ref 70–99)
Glucose-Capillary: 194 mg/dL — ABNORMAL HIGH (ref 70–99)
Glucose-Capillary: 211 mg/dL — ABNORMAL HIGH (ref 70–99)
Glucose-Capillary: 191 mg/dL — ABNORMAL HIGH (ref 70–99)
Glucose-Capillary: 177 mg/dL — ABNORMAL HIGH (ref 70–99)
Glucose-Capillary: 162 mg/dL — ABNORMAL HIGH (ref 70–99)

## 2020-07-11 MED ORDER — SODIUM CHLORIDE 0.9 % IV SOLN
INTRAVENOUS | Status: DC | PRN
  Administered 2020-07-11: 250 mL via INTRAVENOUS
  Administered 2020-07-12: 1000 mL via INTRAVENOUS

## 2020-07-11 MED ORDER — DEXTROSE 10 % IV SOLN: INTRAVENOUS | Status: DC

## 2020-07-11 MED ORDER — FREE WATER
200.0000 mL | Status: DC
  Administered 2020-07-11 – 2020-07-13 (×9): 200 mL

## 2020-07-11 MED ORDER — SODIUM CHLORIDE 0.9 % IV SOLN
1.0000 g | INTRAVENOUS | Status: AC
  Administered 2020-07-11 – 2020-07-17 (×7): 1 g via INTRAVENOUS
  Filled 2020-07-11 (×2): qty 1
  Filled 2020-07-11: qty 10
  Filled 2020-07-11 (×2): qty 1
  Filled 2020-07-11: qty 10
  Filled 2020-07-11: qty 1

## 2020-07-11 NOTE — Progress Notes (Addendum)
NAME:  Harold Mckee, MRN:  [id number], DOB:  [birth year], LOS: 12 ADMISSION DATE:  06/29/2020, CONSULTATION DATE:  06/29/2020 REFERRING MD:  Dr. Jake Samplesawley, CHIEF COMPLAINT:  Bilateral subarachnoid/subdural hematomas   Brief History   74 y o male with HTN and Diabetes who presented after ground level fall resulting in head injury while walking his dog in the park. Per EMS patient was seen drinking alcohol prior to fall.   On arrival patient was seen bleeding from his left ear. Vitals on arrival significant for mild tachypnea and hypertension. Labwork significant for hyperglycemia and mild leukocytosis. CT C-spin and head positive for  left scull fracture and bilateral subarachnoid/subdural hematomas. PCCM consulted for assistance ventilator managment  Past Medical History  HTN  Diabetes   Significant Hospital Events   Admitted 7/13  7.15 > Started on empiric cefepime 7/14 due to fever and leukocytosis.  PCT 1.06 and lactate 5.9.  7/25 > sputum culture positive for few Klebsiella pneumonia, IV ceftriaxone started  Consults:  Neurosurgery   ENT 7/15 - Dr Suszanne Connerseoh > Left longitudinal temporal bone fracture with possible separation of the malleus and incus - Cotton ball packing of the left ear canal. Change cotton ball as needed.  The T-bone CT reviewed. No obvious otic capsule involvement.   No acute ENT intervention needed at this time. - The patient will need hearing test as an outpatient in my office. My need middle ear exploration if significant conductive hearing loss is noted.   Procedures:    Significant Diagnostic Tests:  CT C-spine and head 7/13 > 1. Nondisplaced fracture of the left parietal calvarium along the left lambdoid suture extending into the left occipital calvarium and left temporal bone with involvement of the left mastoid air cell and external auditory canal. Dedicated temporal bone CT is recommended for further evaluation. 2. Bilateral subdural and subarachnoid  hemorrhages. No midline shift. 3. Focal hemorrhage with small pockets of pneumocephalus in the left posterior temporal lobe along the calvarial fracture. This likely represents combination of subarachnoid and intraparenchymal hemorrhage. 4. No acute/traumatic cervical spine pathology. CT head 7/14 > blooming contusions in bilateral inferior frontal, bilateral, inferior temporal, and left parieto-occipital lobes.  Generalized increased in SAH, mild IVH without hydrocephalus.  Progressive scalp swelling over left calvarial fracture.  No pneumocephalus.  CT temporal bones 7/14 > longitudinal left temporal bone fx.  Hemotympanum  On left with pneuocephalus and soft tissue emphysema.  EEG 7/14 > mod diffuse encephalopathy.  No seizures or epileptiform activity noted. Head CT 17 >> no change, extensive hemorrhagic contusions in the frontal lobes bilaterally, bifrontal subdural hematoma, diffuse subarachnoid hemorrhage, IVH without hydrocephalus  EEG 7/19 >> Moderate to severe encephalopathy, nonspecific.  No seizures or epileptiform discharges seen  Micro Data:  COVID 7/13 > negative  Blood 7/14 > negative Respiratory 7/16 >> normal flora  7/22 >> Blood 7/16 >> negative Sputum culture 7/22 > few Klebsiella pneumoniae, susceptibilities pending   Antimicrobials:  Cefepime 7/13 > 7/20 Vanco 7/16 >>  7/20  Interim history/subjective:  Failed SBT this a.m. due to increased work of breathing Reported thick white secretions per ET tube No acute events overnight  Objective   Blood pressure 126/69, pulse (!) 113, temperature 100 F (37.8 C), temperature source Axillary, resp. rate 18, height 6\' 1"  (1.854 m), weight 72.1 kg, SpO2 99 %.    Vent Mode: PRVC FiO2 (%):  [30 %] 30 % Set Rate:  [16 bmp] 16 bmp Vt Set:  [191[630 mL] 630 mL  PEEP:  [5 cmH20] 5 cmH20 Plateau Pressure:  [20 cmH20-24 cmH20] 24 cmH20   Intake/Output Summary (Last 24 hours) at 07/11/2020 0719 Last data filed at 07/11/2020  0600 Gross per 24 hour  Intake 1795 ml  Output 3050 ml  Net -1255 ml   Filed Weights   07/07/20 0500 07/08/20 0500 07/11/20 0500  Weight: 70.2 kg 70 kg 72.1 kg    Examination: General: Chronically ill appearing elderly gentleman lying in bed on mechanical ventilation in no acute distress HEENT: 6 cuffed Shiley trach midline, MM pink/moist, PERRL, sclera nonicteric Neuro: Eyes spontaneously open but does not track to movement, will grimace to painful stimuli, unable to follow any commands, CV: Slightly tachycardic, s1s2 regular rate and rhythm, no murmur, rubs, or gallops,  PULM: Coarse breath sounds bilaterally, no increased work of breathing, oxygen saturations 98 200% on 30 FiO2 department, failed SBT this a.m. GI: soft, bowel sounds active in all 4 quadrants, non-tender, non-distended, tolerating TF Extremities: warm/dry, no edema  Skin: no rashes or lesions'  Assessment & Plan:   Acute hypoxic respiratory Failure -In the setting of bilateral SAH/SDH -S/P percutaneous tracheostomy tube placement 7/24 P: Continue ventilator support with lung protective strategies  Failed SBT trial morning of 7/25 due to respiratory distress including tachypnea Goal to wean to ATC trials, mentation may preclude this Wean PEEP and FiO2 for sats greater than 90%. Head of bed elevated 30 degrees. Plateau pressures less than 30 cm H20.  Follow intermittent chest x-ray and ABG.   SAT/SBT as tolerated, mentation preclude extubation  Ensure adequate pulmonary hygiene  VAP bundle in place  PAD protocol  Mechanical fall resulting in Left skull fracture and bilateral subarachnoid/subdural hemorrhage -Received 3% saline for first few days, now off -CT scan showed severe bifrontal contusion/intraparenchymal hemorrhage P: Management per neurology  Maintain neuro protective measures; goal for eurothermia, euglycemia, eunatermia, normoxia, and PCO2 goal of 35-40 Nutrition and bowel regiment  Seizure  precautions  AEDs per neurology   HCAP -Sputum culture positive for Klebsiella pneumoniae 7/24 coupled with continued new leukocytosis fever and sputum production antibiotics were resumed -S/P 7 days of IV Vanc and Cefepime  P:  IV ceftriaxone started 7/25 Trend CBC and fever curve As needed antipyretics Encourage frequent pulmonary hygiene Routine tracheal suctioning  Persistent acute traumatic encephalopathy.   -He has not shown any improvement in mental status with time, discontinuation sedation -EEG ruled out acute seizure P: Continue empiric Keppra Supportive care Minimize sedation Mobilize as able  Left longitudinal temporal bone fracture with possible separation of the malleus and incus. -Seen by Dr. Avel Sensor ENT appreciate input and assistance.  No indication for surgery at this time but he will need follow-up, assessment of his hearing, possible middle ear exploration in the future depending on status P: Follow-up in the outpatient setting  Hypertension, uncontrolled -Currently well controlled on current regiment  P: Continue amlodipine, HCTZ, and metoprolol As needed hydralazine and labetalol  Diabetes type 2, uncontrolled with hyperglycemia -Blood sugar better controlled with increased to be coverage P: Continue resistant sliding scale insulin Continue Levemir 26 units twice daily Continue 8 units every 4 hour to be coverage Check CBG every 4 hours  Alcohol intoxication.  - ETOH level 26 on arrival, unsure if this is acute vs chronic P: Has completed thiamine and folate supplementation  Hyponatremia -Sodium 151 on bmet 7/25 P: Begin free water flushes Trend bmet  Best practice:  Diet: NPO for trach today Pain/Anxiety/Delirium protocol (if indicated): PRN  VAP protocol (if indicated): Yes DVT prophylaxis: Subcu heparin GI prophylaxis: PPI Glucose control: SSI/Levemir Mobility: Bedrest  Code Status: Full Family Communication: Updated wife over the  phone 7/25 with all questions answered Disposition: ICU  LABS    PULMONARY Recent Labs  Lab 07/10/20 0355  PHART 7.560*  PCO2ART 29.2*  PO2ART 62*  HCO3 26.1  TCO2 27  O2SAT 95.0    CBC Recent Labs  Lab 07/07/20 1058 07/07/20 1058 07/10/20 0355 07/10/20 0609 07/11/20 0510  HGB 9.9*   < > 9.9* 9.8* 9.3*  HCT 31.3*   < > 29.0* 31.6* 29.5*  WBC 11.6*  --   --  16.3* 16.5*  PLT 248  --   --  PLATELET CLUMPS NOTED ON SMEAR, UNABLE TO ESTIMATE 243   < > = values in this interval not displayed.    COAGULATION Recent Labs  Lab 07/09/20 1145  INR 1.2    CARDIAC  No results for input(s): TROPONINI in the last 168 hours. No results for input(s): PROBNP in the last 168 hours.   CHEMISTRY Recent Labs  Lab 07/05/20 0805 07/05/20 0805 07/06/20 4315 07/06/20 4008 07/07/20 0407 07/07/20 0407 07/07/20 1058 07/10/20 0355 07/10/20 0355 07/10/20 0609 07/11/20 0510  NA 147*   < > 146*  --  146*  --   --  147*  --  144 151*  K 4.2   < > 4.1   < > 4.5   < >  --  4.1   < > 4.1 3.8  CL 113*  --  113*  --  109  --   --   --   --  106 112*  CO2 22  --  23  --  24  --   --   --   --  25 26  GLUCOSE 330*  --  268*  --  308*  --   --   --   --  326* 198*  BUN 30*  --  33*  --  32*  --   --   --   --  53* 50*  CREATININE 1.01   < > 1.06  --  1.00  --  1.14  --   --  1.20 0.87  CALCIUM 10.1  --  10.0  --  10.1  --   --   --   --  10.5* 10.5*  MG  --   --   --   --   --   --   --   --   --  2.1  --   PHOS 3.2  --   --   --   --   --   --   --   --  3.2  --    < > = values in this interval not displayed.   Estimated Creatinine Clearance: 74.8 mL/min (by C-G formula based on SCr of 0.87 mg/dL).   LIVER Recent Labs  Lab 07/09/20 1145  INR 1.2     INFECTIOUS Recent Labs  Lab 07/05/20 0805 07/06/20 0632 07/10/20 0609  PROCALCITON 1.75 1.60 1.81     ENDOCRINE CBG (last 3)  Recent Labs    07/10/20 2006 07/10/20 2312 07/11/20 0342  GLUCAP 204* 182* 164*     CRITICAL CARE Performed by: [name]  Total critical care time: 38 minutes  Critical care time was exclusive of separately billable procedures and treating other patients.  Critical care was necessary to treat or prevent  imminent or life-threatening deterioration.  Critical care was time spent personally by me on the following activities: development of treatment plan with patient and/or surrogate as well as nursing, discussions with consultants, evaluation of patient's response to treatment, examination of patient, obtaining history from patient or surrogate, ordering and performing treatments and interventions, ordering and review of laboratory studies, ordering and review of radiographic studies, pulse oximetry and re-evaluation of patient's condition.  [name], NP-C [facility] Contact / Pager information can be found on Amion  07/11/2020, 7:24 AM

## 2020-07-11 NOTE — Progress Notes (Signed)
eLink Physician-Brief Progress Note Patient Name: Harold Mckee DOB: [birth year] MRN: [id number]   Date of Service  07/11/2020  HPI/Events of Note  Patient having procedure in AM. Tube feeds stop at 12 midnight. Scheduled to get Levemir 26 units at 10 PM.  eICU Interventions  Plan: 1. Start D10W at 50 mL/hour when tube feeds stopped.      Intervention Category Major Interventions: Hyperglycemia - active titration of insulin therapy  Jamilla Galli [place] 07/11/2020, 10:18 PM

## 2020-07-11 NOTE — Progress Notes (Signed)
SLP Cancellation Note  Patient Details Name: Harold Mckee MRN: [id number] DOB: September 11, 1946   Cancelled treatment:       Reason Eval/Treat Not Completed: Patient not medically ready (Pt remains on the vent at this time. SLP will follow up. )  [name], MS, CCC-SLP Acute Rehabilitation Services Office number [phone] Pager [phone]  [name] 07/11/2020, 8:12 AM

## 2020-07-11 NOTE — Progress Notes (Signed)
Patient ID: Harold Mckee, male   DOB: August 14, 1946, 74 y.o.   MRN: [id number] BP 118/70   Pulse (!) 112   Temp (!) 101.6 F (38.7 C) (Axillary) Comment: Nurse notified  Resp 18   Ht 6\' 1"  (1.854 m)   Wt 72.1 kg   SpO2 97%   BMI 20.97 kg/m  Not responsive to voice Winces with noxious stimuli, does not localize  perrl +oculocephalics No neurological change

## 2020-07-12 ENCOUNTER — Encounter (HOSPITAL_COMMUNITY)
Admission: EM | Disposition: A | Discharge status: Hospice | Payer: Medicare HMO | Source: Home / Self Care | Attending: Neurological Surgery

## 2020-07-12 HISTORY — PX: ESOPHAGOGASTRODUODENOSCOPY: SHX5428

## 2020-07-12 HISTORY — PX: PEG PLACEMENT: SHX5437

## 2020-07-12 LAB — BASIC METABOLIC PANEL
Anion gap: 14 (ref 5–15)
BUN: 43 mg/dL — ABNORMAL HIGH (ref 8–23)
CO2: 25 mmol/L (ref 22–32)
Calcium: 10.5 mg/dL — ABNORMAL HIGH (ref 8.9–10.3)
Chloride: 112 mmol/L — ABNORMAL HIGH (ref 98–111)
Creatinine, Ser: 0.97 mg/dL (ref 0.61–1.24)
GFR calc Af Amer: >60 mL/min (ref >60)
GFR calc non Af Amer: >60 mL/min (ref >60)
Glucose, Bld: 222 mg/dL — ABNORMAL HIGH (ref 70–99)
Potassium: 4.4 mmol/L (ref 3.5–5.1)
Sodium: 151 mmol/L — ABNORMAL HIGH (ref 135–145)

## 2020-07-12 LAB — GLUCOSE, CAPILLARY
Glucose-Capillary: 199 mg/dL — ABNORMAL HIGH (ref 70–99)
Glucose-Capillary: 210 mg/dL — ABNORMAL HIGH (ref 70–99)
Glucose-Capillary: 198 mg/dL — ABNORMAL HIGH (ref 70–99)
Glucose-Capillary: 215 mg/dL — ABNORMAL HIGH (ref 70–99)
Glucose-Capillary: 213 mg/dL — ABNORMAL HIGH (ref 70–99)
Glucose-Capillary: 197 mg/dL — ABNORMAL HIGH (ref 70–99)

## 2020-07-12 SURGERY — EGD (ESOPHAGOGASTRODUODENOSCOPY)
Anesthesia: Moderate Sedation

## 2020-07-12 MED ORDER — MIDAZOLAM HCL 2 MG/2ML IJ SOLN
10.0000 mg | Freq: Once | INTRAMUSCULAR | Status: DC
  Filled 2020-07-12 (×2): qty 10

## 2020-07-12 MED ORDER — FENTANYL CITRATE (PF) 100 MCG/2ML IJ SOLN
INTRAMUSCULAR | Status: AC
  Filled 2020-07-12: qty 2

## 2020-07-12 MED ORDER — FENTANYL CITRATE (PF) 100 MCG/2ML IJ SOLN
100.0000 ug | Freq: Once | INTRAMUSCULAR | Status: AC
  Administered 2020-07-12: 100 ug via INTRAVENOUS
  Filled 2020-07-12: qty 2

## 2020-07-12 MED ORDER — MIDAZOLAM HCL 2 MG/2ML IJ SOLN
10.0000 mg | Freq: Once | INTRAMUSCULAR | Status: AC
  Administered 2020-07-12: 4 mg via INTRAVENOUS
  Filled 2020-07-12: qty 10

## 2020-07-12 NOTE — Progress Notes (Signed)
NAME:  Ajdin Macke, MRN:  [id number], DOB:  Feb 05, 1946, LOS: 13 ADMISSION DATE:  06/29/2020, CONSULTATION DATE:  06/29/2020 REFERRING MD:  Dr. Jake Samples, CHIEF COMPLAINT:  Bilateral subarachnoid/subdural hematomas   Brief History   74 y o male with HTN and Diabetes who presented after ground level fall resulting in head injury while walking his [place] in the park. Per EMS patient was seen drinking alcohol prior to fall.   On arrival patient was seen bleeding from his left ear. Vitals on arrival significant for mild tachypnea and hypertension. Labwork significant for hyperglycemia and mild leukocytosis. CT C-spin and head positive for  left scull fracture and bilateral subarachnoid/subdural hematomas. PCCM consulted for assistance ventilator managment  Past Medical History  HTN  Diabetes   Significant Hospital Events   Admitted 7/13  7.15 > Started on empiric cefepime 7/14 due to fever and leukocytosis.  PCT 1.06 and lactate 5.9.  7/25 > sputum culture positive for few Klebsiella pneumonia, IV ceftriaxone started  Consults:  Neurosurgery   ENT 7/15 - Dr Suszanne Conners > Left longitudinal temporal bone fracture with possible separation of the malleus and incus - Cotton ball packing of the left ear canal. Change cotton ball as needed.  The T-bone CT reviewed. No obvious otic capsule involvement.   No acute ENT intervention needed at this time. - The patient will need hearing test as an outpatient in my office. My need middle ear exploration if significant conductive hearing loss is noted.   Procedures:    Significant Diagnostic Tests:  CT C-spine and head 7/13 > 1. Nondisplaced fracture of the left parietal calvarium along the left lambdoid suture extending into the left occipital calvarium and left temporal bone with involvement of the left mastoid air cell and external auditory canal. Dedicated temporal bone CT is recommended for further evaluation. 2. Bilateral subdural and subarachnoid  hemorrhages. No midline shift. 3. Focal hemorrhage with small pockets of pneumocephalus in the left posterior temporal lobe along the calvarial fracture. This likely represents combination of subarachnoid and intraparenchymal hemorrhage. 4. No acute/traumatic cervical spine pathology. CT head 7/14 > blooming contusions in bilateral inferior frontal, bilateral, inferior temporal, and left parieto-occipital lobes.  Generalized increased in SAH, mild IVH without hydrocephalus.  Progressive scalp swelling over left calvarial fracture.  No pneumocephalus.  CT temporal bones 7/14 > longitudinal left temporal bone fx.  Hemotympanum  On left with pneuocephalus and soft tissue emphysema.  EEG 7/14 > mod diffuse encephalopathy.  No seizures or epileptiform activity noted. Head CT 17 >> no change, extensive hemorrhagic contusions in the frontal lobes bilaterally, bifrontal subdural hematoma, diffuse subarachnoid hemorrhage, IVH without hydrocephalus  EEG 7/19 >> Moderate to severe encephalopathy, nonspecific.  No seizures or epileptiform discharges seen  Micro Data:  COVID 7/13 > negative  Blood 7/14 > negative Respiratory 7/16 >> normal flora  7/22 >> Blood 7/16 >> negative Sputum culture 7/22 > few Klebsiella pneumoniae, susceptibilities pending   Antimicrobials:  Cefepime 7/13 > 7/20 Vanco 7/16 >>  7/20  Ctx 7/25 -->  Interim history/subjective:  Started on D10 while tube feeds held for PEG.  Continued lantus.  Could not tolerate SBT today due to tachypnea.   Objective   Blood pressure 116/74, pulse (!) 108, temperature 99.7 F (37.6 C), temperature source Axillary, resp. rate 19, height 6\' 1"  (1.854 m), weight 72.1 kg, SpO2 100 %.    Vent Mode: PRVC FiO2 (%):  [30 %] 30 % Set Rate:  [16 bmp] 16 bmp Vt Set:  [  630 mL] 630 mL PEEP:  [5 cmH20] 5 cmH20 Pressure Support:  [14 cmH20] 14 cmH20 Plateau Pressure:  [17 cmH20-20 cmH20] 20 cmH20   Intake/Output Summary (Last 24 hours) at  07/12/2020 1115 Last data filed at 07/12/2020 1000 Gross per 24 hour  Intake 2756.33 ml  Output 2450 ml  Net 306.33 ml   Filed Weights   07/07/20 0500 07/08/20 0500 07/11/20 0500  Weight: 70.2 kg 70 kg 72.1 kg    Examination: General: Chronically ill appearing 74-year-old gentleman lying in bed on mechanical ventilation in no acute distress HEENT: 6 cuffed Shiley trach midline, MM pink/moist, PERRL, sclera nonicteric Neuro: Eyes spontaneously open but does not track to movement, will grimace to painful stimuli, unable to follow any commands, CV: Slightly tachycardic, s1s2 regular rate and rhythm, no murmur, rubs, or gallops,  PULM: Coarse breath sounds bilaterally, no increased work of breathing, oxygen saturations 98 200% on 30 FiO2 department, failed SBT this a.m. GI: soft, bowel sounds active in all 4 quadrants, non-tender, non-distended, tolerating TF Extremities: warm/dry, no edema  Skin: no rashes or lesions'  Assessment & Plan:   Acute hypoxic respiratory Failure -In the setting of bilateral SAH/SDH -S/P percutaneous tracheostomy tube placement 7/24 P: Continue ventilator support with lung protective strategies  Failed SBT trial morning of 7/26 due to respiratory distress including tachypnea Currently being treated for Klebsiella PNA with ceftriaxone.   Goal to wean to ATC trials Wean PEEP and FiO2 for sats greater than 90%. Head of bed elevated 30 degrees. Plateau pressures less than 30 cm H20.  Follow intermittent chest x-ray and ABG.   SAT/SBT as tolerated  Ensure adequate pulmonary hygiene  VAP bundle in place  PAD protocol  HCAP -Sputum culture positive for Klebsiella pneumoniae 7/24 coupled with continued new leukocytosis fever and sputum production antibiotics were resumed -S/P 7 days of IV Vanc and Cefepime  P:  IV ceftriaxone started 7/25 Trend CBC and fever curve As needed antipyretics Encourage frequent pulmonary hygiene Routine tracheal  suctioning  Mechanical fall resulting in Left skull fracture and bilateral subarachnoid/subdural hemorrhage -Received 3% saline for first few days, now off -CT scan showed severe bifrontal contusion/intraparenchymal hemorrhage P: PEG placement today.  Management per neurology  Maintain neuro protective measures; goal for eurothermia, euglycemia, eunatermia, normoxia, and PCO2 goal of 35-40 Nutrition and bowel regiment  Seizure precautions  AEDs per neurology    Persistent acute traumatic encephalopathy.   -He has not shown any improvement in mental status with time, discontinuation sedation -EEG ruled out acute seizure P: Continue empiric Keppra Supportive care Minimize sedation Mobilize as able  Left longitudinal temporal bone fracture with possible separation of the malleus and incus. -Seen by Dr. Avel Sensor ENT appreciate input and assistance.  No indication for surgery at this time but he will need follow-up, assessment of his hearing, possible middle ear exploration in the future depending on status P: Follow-up in the outpatient setting  Hypertension, uncontrolled -Currently well controlled on current regiment  P: Continue amlodipine, HCTZ, and metoprolol As needed hydralazine and labetalol  Diabetes type 2, uncontrolled with hyperglycemia -Blood sugar better controlled with increased to be coverage P: Started on D10 overnight while tube feeds held for PEG.  Monitor BS adjust D10 as needed until tube feeds can restart.  Continue resistant sliding scale insulin Continue Levemir 26 units twice daily Short acting standing insulin held, cont correctional Check CBG every 4 hours  Alcohol intoxication.  - ETOH level 26 on arrival, unsure if this  is acute vs chronic P: Has completed thiamine and folate supplementation  Hyponatremia -Sodium 151 on bmet 7/25 P: Begin free water flushes Trend bmet  Best practice:  Diet: NPO for PEG Pain/Anxiety/Delirium protocol (if  indicated): PRN  VAP protocol (if indicated): Yes DVT prophylaxis: Subcu heparin GI prophylaxis: PPI Glucose control: SSI/Levemir Mobility: Bedrest  Code Status: Full Family Communication: Wife to be updated.  Disposition: ICU  LABS    PULMONARY Recent Labs  Lab 07/10/20 0355  PHART 7.560*  PCO2ART 29.2*  PO2ART 62*  HCO3 26.1  TCO2 27  O2SAT 95.0    CBC Recent Labs  Lab 07/07/20 1058 07/07/20 1058 07/10/20 0355 07/10/20 0609 07/11/20 0510  HGB 9.9*   < > 9.9* 9.8* 9.3*  HCT 31.3*   < > 29.0* 31.6* 29.5*  WBC 11.6*  --   --  16.3* 16.5*  PLT 248  --   --  PLATELET CLUMPS NOTED ON SMEAR, UNABLE TO ESTIMATE 243   < > = values in this interval not displayed.    COAGULATION Recent Labs  Lab 07/09/20 1145  INR 1.2    CARDIAC  No results for input(s): TROPONINI in the last 168 hours. No results for input(s): PROBNP in the last 168 hours.   CHEMISTRY Recent Labs  Lab 07/06/20 7253 07/06/20 6644 07/07/20 0407 07/07/20 0407 07/07/20 1058 07/10/20 0355 07/10/20 0355 07/10/20 0609 07/10/20 0609 07/11/20 0510 07/12/20 0522  NA 146*   < > 146*  --   --  147*  --  144  --  151* 151*  K 4.1   < > 4.5   < >  --  4.1   < > 4.1   < > 3.8 4.4  CL 113*  --  109  --   --   --   --  106  --  112* 112*  CO2 23  --  24  --   --   --   --  25  --  26 25  GLUCOSE 268*  --  308*  --   --   --   --  326*  --  198* 222*  BUN 33*  --  32*  --   --   --   --  53*  --  50* 43*  CREATININE 1.06   < > 1.00  --  1.14  --   --  1.20  --  0.87 0.97  CALCIUM 10.0  --  10.1  --   --   --   --  10.5*  --  10.5* 10.5*  MG  --   --   --   --   --   --   --  2.1  --   --   --   PHOS  --   --   --   --   --   --   --  3.2  --   --   --    < > = values in this interval not displayed.   Estimated Creatinine Clearance: 67.1 mL/min (by C-G formula based on SCr of 0.97 mg/dL).   LIVER Recent Labs  Lab 07/09/20 1145  INR 1.2     INFECTIOUS Recent Labs  Lab 07/06/20 0632  07/10/20 0609  PROCALCITON 1.60 1.81     ENDOCRINE CBG (last 3)  Recent Labs    07/11/20 2311 07/12/20 0311 07/12/20 0740  GLUCAP 162* 199* 210*   Total critical care time 35  min  Charlotte SanesNicole Pavle Wiler MD, CCM

## 2020-07-12 NOTE — Progress Notes (Signed)
SLP Cancellation Note  Patient Details Name: Harold Mckee MRN: [id number] DOB: 07/03/1946   Cancelled treatment:       Reason Eval/Treat Not Completed: Medical issues which prohibited therapy - pt on ventilator. Will continue efforts.  Jawann Urbani B. Murvin Natal, [facility], CCC-SLP Speech Language Pathologist Office: [phone]  Leigh Aurora 07/12/2020, 2:31 PM

## 2020-07-12 NOTE — Progress Notes (Signed)
Patient placed back on full vent support due to increased RR.

## 2020-07-12 NOTE — Progress Notes (Signed)
Subjective: Intubated,off sedation, no issues overnight  Objective: PE: Eyesopen to pain, does not track, appears slightly more alert PERRL Trach in place Slight WD to pain in RLE, otherwise no motor response to pain Winces strongly to pain    Assessment/Plan: 1. Left occipital parietotemporal calvarial fracture secondary to fall 2.Left temporal contusion 3.Bilateral frontal traumatic subarachnoidhemorrhage & contusions/subdural hematomaswith cerebral edema 4.TBI, severe 5. HCAP   -neuro ICU -Q4hr neurochecks -Keppra 1000 mg twice daily(EEG negx2) -N.p.o., coretrak feeds -Blood pressure control -ok for SQH -CT brain stable 7/20 -s/p trach -PEG pending -will work on transition to rehab once PEG in place  Please do no hesitate to call with questions or concerns  Monia Pouch, DO Neurosurgeon [facility] Cell: [phone]

## 2020-07-12 NOTE — Op Note (Signed)
   Procedure Note  Date: 07/12/2020  Procedure: esophagogastroduodenoscopy (EGD) and percutaneous endoscopic gastrostomy (PEG) tube placement  Pre-op diagnosis: dysphagia Post-op diagnosis: same  Indication and clinical history: 81M with dysphagia after fall with ICH/TBI  Surgeon: Jesusita Oka, MD Assistant: Maxwell Caul, [place]   Anesthesia: MAC, 131mg fentanyl, 444mversed  Findings: . Specimen: none . EBL: <5cc . Drains/Implants: PEG tube, 5cm at the skin   Disposition: ICU/PACU  Description of Procedure: The patient was positioned semi-recumbent. Time-out was performed verifying correct patient, procedure, signature of informed consent, and pre-operative antibiotics as indicated. MAC induction was uneventful and a bite block was placed into the oropharynx. The endoscope was inserted into the oropharynx and advanced down the esophagus into the stomach and into the duodenum. The visualized esophagus and duodenum were unremarkable. The endoscope was retracted back into the stomach and the stomach was insufflated. The stomach was inspected and was also normal. Transillumination was performed. The light was visible on the external skin and dimpling of the stomach was noted endoscopically with manual pressure. The abdomen was prepped and draped in the usual sterile fashion. Transillumination and dimpling were repeated and local anesthetic was infiltrated to make a skin wheal at the site of transillumination. The needle was inserted perpendicularly to the skin and the tip of the needle was visualized endoscopically. As the needle was retracted, the tract was also anesthetized. A skin nick was made at the site of the wheal and an introducer needle and sheath were inserted. The needle was removed and guidewire inserted. The guidewire was grasped by an endoscopic snare and the snare, guidewire, and endoscope retracted out of the oropharynx. The PEG tube was secured to the guidewire and retracted through  the mouth and esophagus into the stomach. The PEG tube was secured with a bolster and was visualized endoscopically to spin freely circumferentially and also be without gaps between the internal bumper and the stomach wall. There was no evidence of bleeding. The PEG bolster was secured at 5cm at the skin and there were no gaps between the bolster and the abdominal wall. The stomach was desufflated endoscopically and the endoscope removed. The bite block was also removed. The patient tolerated the procedure well and there were no complications.   The patient may have water and medications administered via the PEG tube beginning immediately and tube feeds may be initiated four hours post-procedure.    AyJesusita OkaMD General and TrZionurgery

## 2020-07-13 ENCOUNTER — Encounter (HOSPITAL_COMMUNITY): Payer: Self-pay | Admitting: Surgery

## 2020-07-13 DIAGNOSIS — S06361D Traumatic hemorrhage of cerebrum, unspecified, with loss of consciousness of 30 minutes or less, subsequent encounter: Secondary | ICD-10-CM

## 2020-07-13 LAB — GLUCOSE, CAPILLARY
Glucose-Capillary: 203 mg/dL — ABNORMAL HIGH (ref 70–99)
Glucose-Capillary: 211 mg/dL — ABNORMAL HIGH (ref 70–99)
Glucose-Capillary: 161 mg/dL — ABNORMAL HIGH (ref 70–99)
Glucose-Capillary: 144 mg/dL — ABNORMAL HIGH (ref 70–99)
Glucose-Capillary: 129 mg/dL — ABNORMAL HIGH (ref 70–99)
Glucose-Capillary: 199 mg/dL — ABNORMAL HIGH (ref 70–99)

## 2020-07-13 MED ORDER — CHLORHEXIDINE GLUCONATE CLOTH 2 % EX PADS
6.0000 | MEDICATED_PAD | Freq: Every day | CUTANEOUS | Status: DC
  Administered 2020-07-14 – 2020-07-26 (×13): 6 via TOPICAL

## 2020-07-13 MED ORDER — FREE WATER
200.0000 mL | Status: DC
  Administered 2020-07-13 – 2020-07-15 (×18): 200 mL

## 2020-07-13 NOTE — Progress Notes (Signed)
NAME:  Harold Mckee, MRN:  [id number], DOB:  [birth year], LOS: 14 ADMISSION DATE:  06/29/2020, CONSULTATION DATE:  06/29/2020 REFERRING MD:  Dr. Jake Samples, CHIEF COMPLAINT:  Bilateral subarachnoid/subdural hematomas   Brief History   74 y o male with HTN and Diabetes who presented after ground level fall resulting in head injury while walking his dog in the park. Per EMS patient was seen drinking alcohol prior to fall.   On arrival patient was seen bleeding from his left ear. Vitals on arrival significant for mild tachypnea and hypertension. Labwork significant for hyperglycemia and mild leukocytosis. CT C-spin and head positive for  left scull fracture and bilateral subarachnoid/subdural hematomas. PCCM consulted for assistance ventilator managment  Past Medical History  HTN  Diabetes   Significant Hospital Events   Admitted 7/13  7.15 > Started on empiric cefepime 7/14 due to fever and leukocytosis.  PCT 1.06 and lactate 5.9.  7/25 > sputum culture positive for few Klebsiella pneumonia, IV ceftriaxone started  Consults:  Neurosurgery   ENT 7/15 - Dr Suszanne Conners > Left longitudinal temporal bone fracture with possible separation of the malleus and incus - Cotton ball packing of the left ear canal. Change cotton ball as needed.  The T-bone CT reviewed. No obvious otic capsule involvement.   No acute ENT intervention needed at this time. - The patient will need hearing test as an outpatient in my office. My need middle ear exploration if significant conductive hearing loss is noted.   Procedures:  7/23 tracheostomy 7/25 PEG tube placement  Significant Diagnostic Tests:  CT C-spine and head 7/13 > 1. Nondisplaced fracture of the left parietal calvarium along the left lambdoid suture extending into the left occipital calvarium and left temporal bone with involvement of the left mastoid air cell and external auditory canal. Dedicated temporal bone CT is recommended for further  evaluation. 2. Bilateral subdural and subarachnoid hemorrhages. No midline shift. 3. Focal hemorrhage with small pockets of pneumocephalus in the left posterior temporal lobe along the calvarial fracture. This likely represents combination of subarachnoid and intraparenchymal hemorrhage. 4. No acute/traumatic cervical spine pathology. CT head 7/14 > blooming contusions in bilateral inferior frontal, bilateral, inferior temporal, and left parieto-occipital lobes.  Generalized increased in SAH, mild IVH without hydrocephalus.  Progressive scalp swelling over left calvarial fracture.  No pneumocephalus.  CT temporal bones 7/14 > longitudinal left temporal bone fx.  Hemotympanum  On left with pneuocephalus and soft tissue emphysema.  EEG 7/14 > mod diffuse encephalopathy.  No seizures or epileptiform activity noted. Head CT 17 >> no change, extensive hemorrhagic contusions in the frontal lobes bilaterally, bifrontal subdural hematoma, diffuse subarachnoid hemorrhage, IVH without hydrocephalus  EEG 7/19 >> Moderate to severe encephalopathy, nonspecific.  No seizures or epileptiform discharges seen  Micro Data:  COVID 7/13 > negative  Blood 7/14 > negative Respiratory 7/16 >> normal flora  7/22 >> Blood 7/16 >> negative Sputum culture 7/22 > few Klebsiella pneumoniae,   Antimicrobials:  Cefepime 7/13 > 7/20 Vanco 7/16 >>  7/20  Ctx 7/25 -->  Interim history/subjective:  Tube feed was restarted via PEG, patient is afebrile with low-grade temperature spikes.  Still not following commands Not tolerating pressure support trials, due to tachypnea  Objective   Blood pressure (!) 132/73, pulse (!) 107, temperature 99.4 F (37.4 C), temperature source Axillary, resp. rate 19, height 6\' 1"  (1.854 m), weight 72 kg, SpO2 100 %.    Vent Mode: PRVC FiO2 (%):  [30 %] 30 % Set  Rate:  [16 bmp] 16 bmp Vt Set:  [630 mL] 630 mL PEEP:  [5 cmH20] 5 cmH20 Plateau Pressure:  [17 cmH20-20 cmH20] 19 cmH20    Intake/Output Summary (Last 24 hours) at 07/13/2020 0959 Last data filed at 07/13/2020 0935 Gross per 24 hour  Intake 2576.21 ml  Output 1250 ml  Net 1326.21 ml   Filed Weights   07/08/20 0500 07/11/20 0500 07/13/20 0500  Weight: 70 kg 72.1 kg 72 kg    Examination: General: Chronically ill appearing elderly male, lying in bed on mechanical ventilation via trach  HEENT: 6 cuffed Shiley trach midline, MM pink/moist, PERRL, sclera nonicteric Neuro: Eyes spontaneously open but does not track to movement, will grimace to painful stimuli, not following commands, withdraws in bilateral lower extremities, no movement on upper extremity CV: Slightly tachycardic, s1s2 regular rate and rhythm, no murmur, rubs, or gallops,  PULM: Coarse breath sounds bilaterally, no increased work of breathing, no wheezes  GI: soft, bowel sounds active in all 4 quadrants, non-tender, non-distended, tolerating TF Extremities: warm/dry, no edema  Skin: no rashes or lesions'  Assessment & Plan:   Acute hypoxic respiratory Failure, due to healthcare associated pneumonia with Klebsiella -In the setting of bilateral SAH/SDH -S/P percutaneous tracheostomy tube placement 7/24 P:  Patient failed spontaneous breathing trial due to respiratory distress and tachypnea, I adjusted vent [facility] to prevent hypoxemia Respiratory culture grew Klebsiella, sensitive to ceftriaxone Continue ceftriaxone day 3 of 7 Head of bed elevated 30 degrees. Plateau pressures less than 30 cm H20.  Ensure adequate pulmonary hygiene  VAP bundle in place  PAD protocol  Sepsis due to HCAP Meets criteria for sepsis with tachycardia and tachypnea Sputum culture positive for Klebsiella pneumoniae 7/24 coupled with continued new leukocytosis fever and sputum production antibiotics were resumed P:  IV ceftriaxone As needed antipyretics Encourage frequent pulmonary hygiene Routine tracheal suctioning  Mechanical fall resulting  in Left skull fracture and bilateral subarachnoid/subdural hemorrhage -Received 3% saline for first few days, now off -CT scan showed severe bifrontal contusion/intraparenchymal hemorrhage P: Management per neurosurgery Maintain neuro protective measures; goal for eurothermia, euglycemia, eunatermia, normoxia, and PCO2 goal of 35-40 Nutrition and bowel regiment  Seizure precautions  AEDs per neurology    Persistent acute traumatic encephalopathy.   -He has not shown any improvement in mental status with time, discontinuation sedation -EEG ruled out acute seizure P: Continue empiric Keppra Supportive care Minimize sedation Mobilize as able  Left longitudinal temporal bone fracture with possible separation of the malleus and incus. -Seen by Dr. Avel Sensoreoh's ENT appreciate input and assistance.  No indication for surgery at this time but he will need follow-up, assessment of his hearing, possible middle ear exploration in the future depending on status P: Follow-up in the outpatient setting  Hypertension, uncontrolled -Currently well controlled on current regiment  P: Continue amlodipine, HCTZ, and metoprolol As needed hydralazine and labetalol  Diabetes type 2, uncontrolled with hyperglycemia -Blood sugar better controlled with increased to be coverage P: Continue resistant sliding scale insulin Continue Levemir 26 units twice daily Short acting standing insulin held, cont correctional Check CBG every 4 hours  Alcohol intoxication.  - ETOH level 26 on arrival, unsure if this is acute vs chronic P: Has completed thiamine and folate supplementation  Hypernatremia Patient serum sodium is 151 P: Increase free water flushes to 300 cc every 3 hours Trend bmet  Best practice:  Diet: NPO (tube feeds via PEG) Pain/Anxiety/Delirium protocol (if indicated): PRN  VAP  protocol (if indicated): Yes DVT prophylaxis: Subcu heparin GI prophylaxis: PPI Glucose control:  SSI/Levemir Mobility: Bedrest  Code Status: Full Family Communication: Per primary team Disposition: ICU  LABS    PULMONARY Recent Labs  Lab 07/10/20 0355  PHART 7.560*  PCO2ART 29.2*  PO2ART 62*  HCO3 26.1  TCO2 27  O2SAT 95.0    CBC Recent Labs  Lab 07/07/20 1058 07/07/20 1058 07/10/20 0355 07/10/20 0609 07/11/20 0510  HGB 9.9*   < > 9.9* 9.8* 9.3*  HCT 31.3*   < > 29.0* 31.6* 29.5*  WBC 11.6*  --   --  16.3* 16.5*  PLT 248  --   --  PLATELET CLUMPS NOTED ON SMEAR, UNABLE TO ESTIMATE 243   < > = values in this interval not displayed.    COAGULATION Recent Labs  Lab 07/09/20 1145  INR 1.2    CARDIAC  No results for input(s): TROPONINI in the last 168 hours. No results for input(s): PROBNP in the last 168 hours.   CHEMISTRY Recent Labs  Lab 07/07/20 0407 07/07/20 0407 07/07/20 1058 07/10/20 0355 07/10/20 0355 07/10/20 0609 07/10/20 0609 07/11/20 0510 07/12/20 0522  NA 146*  --   --  147*  --  144  --  151* 151*  K 4.5   < >  --  4.1   < > 4.1   < > 3.8 4.4  CL 109  --   --   --   --  106  --  112* 112*  CO2 24  --   --   --   --  25  --  26 25  GLUCOSE 308*  --   --   --   --  326*  --  198* 222*  BUN 32*  --   --   --   --  53*  --  50* 43*  CREATININE 1.00  --  1.14  --   --  1.20  --  0.87 0.97  CALCIUM 10.1  --   --   --   --  10.5*  --  10.5* 10.5*  MG  --   --   --   --   --  2.1  --   --   --   PHOS  --   --   --   --   --  3.2  --   --   --    < > = values in this interval not displayed.   Estimated Creatinine Clearance: 67 mL/min (by C-G formula based on SCr of 0.97 mg/dL).   LIVER Recent Labs  Lab 07/09/20 1145  INR 1.2     INFECTIOUS Recent Labs  Lab 07/10/20 0609  PROCALCITON 1.81     ENDOCRINE CBG (last 3)  Recent Labs    07/12/20 2307 07/13/20 0316 07/13/20 0742  GLUCAP 197* 203* 211*   Total critical care time: 33 minutes  Performed by: Cheri Fowler   Critical care time was exclusive of separately  billable procedures and treating other patients.   Critical care was necessary to treat or prevent imminent or life-threatening deterioration.   Critical care was time spent personally by me on the following activities: development of treatment plan with patient and/or surrogate as well as nursing, discussions with consultants, evaluation of patient's response to treatment, examination of patient, obtaining history from patient or surrogate, ordering and performing treatments and interventions, ordering and review of laboratory studies, ordering and review of radiographic  studies, pulse oximetry and re-evaluation of patient's condition.   Cheri Fowler MD Critical care physician [facility]  Pager: [phone] Mobile: [phone]

## 2020-07-13 NOTE — Progress Notes (Signed)
   Providing Compassionate, Quality Care - Together  NEUROSURGERY PROGRESS NOTE   S: No issues overnight. Slightly more alert today  O: EXAM:  BP 108/68   Pulse 93   Temp 99.7 F (37.6 C) (Axillary)   Resp 20   Ht 6\' 1"  (1.854 m)   Wt 72 kg   SpO2 100%   BMI 20.94 kg/m   Eyes open to voice PERRL Trach in place Slight WD to pain in LUE WD to pain in BLE Winces strongly to pain  ASSESSMENT:  74 y.o. male with  1. Left occipital parietotemporal calvarial fracture secondary to fall 2.Left temporal contusion 3.Bilateral frontal traumatic subarachnoidhemorrhage & contusions/subdural hematomaswith cerebral edema 4.TBI, severe 5. HCAP  PLAN: -neuro ICU -Q4hr neurochecks -Keppra 1000 mg twice daily(EEG negx2) -s/p PEG -Blood pressure control -ok for SQH -CT brain stable 7/20 -s/p trach -transition to stepdown unit and LTAC planning -abx for PNA     Thank you for allowing me to participate in this patient's care.  Please do not hesitate to call with questions or concerns.   8/20, DO Neurosurgeon [facility] Cell: [phone]

## 2020-07-13 NOTE — Progress Notes (Signed)
Physical Therapy Treatment Patient Details Name: Harold Mckee MRN: [id number] DOB: 04/22/1946 Today's Date: 07/13/2020    History of Present Illness 74 y o male with HTN and Diabetes who presented after fall resulting in head injury while walking his dog in the park. Per EMS patient was seen drinking alcohol prior to fall. imaging revealing L skull fracture, Bilat frontal SAH/SDH hematomas, and severe TBI. Pt was trached on 7/23, remains on vent. Planned for PEG tube on Monday 7/26.    PT Comments    Pt with no progress towards goals. Pt remains to have no command follow, no tracking of the eyes, no voluntary movement x4 extremities, and remains on the Vent. Pt placed in chair position of the bed, vitals did not change. Pt tolerated PROM x 4 extremities, grimaced with bilater shld ROM. Pt with R UE stiffness compared to the other extremities. Acute PT to cont to follow.    Follow Up Recommendations  LTACH     Equipment Recommendations  None recommended by PT    Recommendations for Other Services       Precautions / Restrictions Precautions Precautions: Fall Precaution Comments: unresponsive Restrictions Weight Bearing Restrictions: No    Mobility  Bed Mobility               General bed mobility comments: pt dependent for bed mobiltiy  Transfers Overall transfer level: Needs assistance               General transfer comment: assist pt to chair position in bed, pt total assist to bring trunk away from bed, dependent  Ambulation/Gait                 Stairs             Wheelchair Mobility    Modified Rankin (Stroke Patients Only) Modified Rankin (Stroke Patients Only) Pre-Morbid Rankin Score: No symptoms Modified Rankin: Severe disability     Balance Overall balance assessment: Needs assistance Sitting-balance support: Feet unsupported;Bilateral upper extremity supported Sitting balance-Leahy Scale: Zero Sitting balance - Comments: pt  dependnet on posterior support from PT tech to maintain EOB balance, pt with no righting or reactive response with LOB                                    Cognition Arousal/Alertness: Awake/alert Behavior During Therapy: Flat affect Overall Cognitive Status: Difficult to assess Area of Impairment: Following commands;Attention                   Current Attention Level:  (pt with no tracking)   Following Commands:  (no commands follow)       General Comments: pt non-verbal, only response is blink to threat, no tracking of PT or finger      Exercises Other Exercises Other Exercises: PROM to bilat LEs at hips, knees, and ankles Other Exercises: PROM to bilat shoulders, elbows, and wrist/grip    General Comments        Pertinent Vitals/Pain Pain Assessment: Faces Faces Pain Scale: Hurts little more Pain Location: grimacing with bilat shoulder ROM Pain Descriptors / Indicators: Grimacing Pain Intervention(s): Monitored during session    Home Living                      Prior Function            PT Goals (current goals can now be  found in the care plan section) Progress towards PT goals: Not progressing toward goals - comment    Frequency    Min 2X/week      PT Plan Current plan remains appropriate    Co-evaluation              AM-PAC PT "6 Clicks" Mobility   Outcome Measure  Help needed turning from your back to your side while in a flat bed without using bedrails?: Total Help needed moving from lying on your back to sitting on the side of a flat bed without using bedrails?: Total Help needed moving to and from a bed to a chair (including a wheelchair)?: Total Help needed standing up from a chair using your arms (e.g., wheelchair or bedside chair)?: Total Help needed to walk in hospital room?: Total Help needed climbing 3-5 steps with a railing? : Total 6 Click Score: 6    End of Session Equipment Utilized During  Treatment: Oxygen (via vent) Activity Tolerance: Patient tolerated treatment well Patient left: in bed;with call bell/phone within reach (in chair position) Nurse Communication: Mobility status PT Visit Diagnosis: Muscle weakness (generalized) (M62.81);History of falling (Z91.81);Difficulty in walking, not elsewhere classified (R26.2)     Time: 7035-0093 PT Time Calculation (min) (ACUTE ONLY): 12 min  Charges:  $Therapeutic Exercise: 8-22 mins                     Lewis Shock, PT, DPT Acute Rehabilitation Services Pager #: [phone] Office #: [phone]    Iona Hansen 07/13/2020, 2:41 PM

## 2020-07-13 NOTE — Progress Notes (Signed)
1 Day Post-Op  Subjective: Doesn't respond, on vent  ROS: unable, unresponsive  Objective: Vital signs in last 24 hours: Temp:  [98.9 F (37.2 C)-100.1 F (37.8 C)] 99.4 F (37.4 C) (07/27 0800) Pulse Rate:  [91-115] 110 (07/27 0800) Resp:  [16-24] 23 (07/27 0800) BP: (100-134)/(69-82) 119/78 (07/27 0800) SpO2:  [98 %-100 %] 100 % (07/27 0800) FiO2 (%):  [30 %] 30 % (07/27 0755) Weight:  [72 kg] 72 kg (07/27 0500) Last BM Date: 07/12/20  Intake/Output from previous day: 07/26 0701 - 07/27 0700 In: 2203.1 [I.V.:719.4; NG/GT:1162.7; IV Piggyback:321] Out: 900 [Urine:900] Intake/Output this shift: Total I/O In: 421.3 [I.V.:5.7; NG/GT:330; IV Piggyback:85.6] Out: 200 [Urine:200]  PE: Abd: g-tube in place with no bleeding or signs of infection, TFs running with no issues.  Soft, ND  Lab Results:  Recent Labs    07/11/20 0510  WBC 16.5*  HGB 9.3*  HCT 29.5*  PLT 243   BMET Recent Labs    07/11/20 0510 07/12/20 0522  NA 151* 151*  K 3.8 4.4  CL 112* 112*  CO2 26 25  GLUCOSE 198* 222*  BUN 50* 43*  CREATININE 0.87 0.97  CALCIUM 10.5* 10.5*   PT/INR No results for input(s): LABPROT, INR in the last 72 hours. CMP     Component Value Date/Time   NA 151 (H) 07/12/2020 0522   K 4.4 07/12/2020 0522   CL 112 (H) 07/12/2020 0522   CO2 25 07/12/2020 0522   GLUCOSE 222 (H) 07/12/2020 0522   BUN 43 (H) 07/12/2020 0522   CREATININE 0.97 07/12/2020 0522   CALCIUM 10.5 (H) 07/12/2020 0522   PROT 6.4 (L) 07/02/2020 0448   ALBUMIN 2.8 (L) 07/02/2020 0448   AST 21 07/02/2020 0448   ALT 19 07/02/2020 0448   ALKPHOS 30 (L) 07/02/2020 0448   BILITOT 0.8 07/02/2020 0448   GFRNONAA >60 07/12/2020 0522   GFRAA >60 07/12/2020 0522   Lipase  No results found for: LIPASE     Studies/Results: No results found.  Anti-infectives: Anti-infectives (From admission, onward)   Start     Dose/Rate Route Frequency Ordered Stop   07/11/20 0800  cefTRIAXone (ROCEPHIN)  1 g in sodium chloride 0.9 % 100 mL IVPB     Discontinue     1 g 200 mL/hr over 30 Minutes Intravenous Every 24 hours 07/11/20 0708     07/02/20 2200  vancomycin (VANCOREADY) IVPB 750 mg/150 mL        750 mg 150 mL/hr over 60 Minutes Intravenous Every 12 hours 07/02/20 0926 07/06/20 2343   07/02/20 1000  vancomycin (VANCOREADY) IVPB 1500 mg/300 mL        1,500 mg 150 mL/hr over 120 Minutes Intravenous  Once 07/02/20 0905 07/02/20 1259   06/30/20 1400  ceFEPIme (MAXIPIME) 2 g in sodium chloride 0.9 % 100 mL IVPB        2 g 200 mL/hr over 30 Minutes Intravenous Every 8 hours 06/30/20 1332 07/06/20 2359       Assessment/Plan Fall with left skull fracture and bilateral subarachnoid/subdural hemorrhage Acute hypoxic respiratory failure - CCM planning tracheostomy today Left longitudinal temporal bone fracture with possible separation -no surgery Hx Etoh use Type 2 diabetes Hypertension  Persistent Acute Traumatic Encephalopathy without improvement -s/p PEG yesterday tolerated well -tolerating TF -no further issues.  We will sign off   LOS: 14 days    Letha Cape , [facility] 07/13/2020, 8:49 AM Please see Amion  for pager number during day hours 7:00am-4:30pm or 7:00am -11:30am on weekends

## 2020-07-14 ENCOUNTER — Ambulatory Visit: Payer: Medicare HMO | Admitting: Orthopedic Surgery

## 2020-07-14 LAB — GLUCOSE, CAPILLARY
Glucose-Capillary: 178 mg/dL — ABNORMAL HIGH (ref 70–99)
Glucose-Capillary: 140 mg/dL — ABNORMAL HIGH (ref 70–99)
Glucose-Capillary: 151 mg/dL — ABNORMAL HIGH (ref 70–99)
Glucose-Capillary: 150 mg/dL — ABNORMAL HIGH (ref 70–99)
Glucose-Capillary: 154 mg/dL — ABNORMAL HIGH (ref 70–99)
Glucose-Capillary: 223 mg/dL — ABNORMAL HIGH (ref 70–99)

## 2020-07-14 LAB — BASIC METABOLIC PANEL
Anion gap: 14 (ref 5–15)
BUN: 47 mg/dL — ABNORMAL HIGH (ref 8–23)
CO2: 25 mmol/L (ref 22–32)
Calcium: 10.1 mg/dL (ref 8.9–10.3)
Chloride: 110 mmol/L (ref 98–111)
Creatinine, Ser: 0.93 mg/dL (ref 0.61–1.24)
GFR calc Af Amer: >60 mL/min (ref >60)
GFR calc non Af Amer: >60 mL/min (ref >60)
Glucose, Bld: 186 mg/dL — ABNORMAL HIGH (ref 70–99)
Potassium: 3.7 mmol/L (ref 3.5–5.1)
Sodium: 149 mmol/L — ABNORMAL HIGH (ref 135–145)

## 2020-07-14 NOTE — Progress Notes (Signed)
SLP Cancellation Note  Patient Details Name: Harold Mckee MRN: [id number] DOB: 10-18-1946   Cancelled treatment:       Reason Eval/Treat Not Completed: Medical issues which prohibited therapy (on vent, not following commands). Will continue to follow.    Mahala Menghini., M.A. CCC-SLP Acute Rehabilitation Services Pager [phone] Office [phone]  07/14/2020, 7:18 AM

## 2020-07-14 NOTE — Progress Notes (Signed)
   Providing Compassionate, Quality Care - Together  NEUROSURGERY PROGRESS NOTE   S: No issues overnight.   O: EXAM:  BP 111/65   Pulse (!) 120   Temp (!) 102.5 F (39.2 C) (Axillary)   Resp 21   Ht 6\' 1"  (1.854 m)   Wt 72 kg   SpO2 95%   BMI 20.94 kg/m   Eyes open to voice PERRL Trach in place Min movement in BUE WD to pain in BLE Winces strongly to pain  Appears to track to the left but not right side  ASSESSMENT:  74 y.o. male with  1. Left occipital parietotemporal calvarial fracture secondary to fall 2.Left temporal contusion 3.Bilateral frontal traumatic subarachnoidhemorrhage & contusions/subdural hematomaswith cerebral edema 4.TBI, severe 5. HCAP  PLAN: -neuro ICU -Q4hr neurochecks -Keppra 1000 mg twice daily(EEG negx2) -s/p PEG -Blood pressure control -ok for SQH -CT brain stable 7/20 -s/p trach -LTAC planning -abx for PNA    Please do not hesitate to call with questions or concerns.   8/20, DO Neurosurgeon [facility] Cell: [phone]

## 2020-07-14 NOTE — Progress Notes (Signed)
NAME:  Harold Mckee, MRN:  [id number], DOB:  Dec 14, 1946, LOS: 15 ADMISSION DATE:  06/29/2020, CONSULTATION DATE:  06/29/2020 REFERRING MD:  Dr. Jake Samples, CHIEF COMPLAINT:  Bilateral subarachnoid/subdural hematomas   Brief History   74 y o male with HTN and Diabetes who presented after ground level fall resulting in head injury while walking his dog in the park. Per EMS patient was seen drinking alcohol prior to fall.   On arrival patient was seen bleeding from his left ear. Vitals on arrival significant for mild tachypnea and hypertension. Labwork significant for hyperglycemia and mild leukocytosis. CT C-spin and head positive for  left scull fracture and bilateral subarachnoid/subdural hematomas. PCCM consulted for assistance ventilator managment  Past Medical History  HTN  Diabetes   Significant Hospital Events   Admitted 7/13  7.15 > Started on empiric cefepime 7/14 due to fever and leukocytosis.  PCT 1.06 and lactate 5.9.  7/25 > sputum culture positive for few Klebsiella pneumonia, IV ceftriaxone started  Consults:  Neurosurgery   ENT 7/15 - Dr Suszanne Conners > Left longitudinal temporal bone fracture with possible separation of the malleus and incus - Cotton ball packing of the left ear canal. Change cotton ball as needed.  The T-bone CT reviewed. No obvious otic capsule involvement.   No acute ENT intervention needed at this time. - The patient will need hearing test as an outpatient in my office. My need middle ear exploration if significant conductive hearing loss is noted.   Procedures:  7/23 tracheostomy 7/25 PEG tube placement  Significant Diagnostic Tests:  CT C-spine and head 7/13 > 1. Nondisplaced fracture of the left parietal calvarium along the left lambdoid suture extending into the left occipital calvarium and left temporal bone with involvement of the left mastoid air cell and external auditory canal. Dedicated temporal bone CT is recommended for further  evaluation. 2. Bilateral subdural and subarachnoid hemorrhages. No midline shift. 3. Focal hemorrhage with small pockets of pneumocephalus in the left posterior temporal lobe along the calvarial fracture. This likely represents combination of subarachnoid and intraparenchymal hemorrhage. 4. No acute/traumatic cervical spine pathology. CT head 7/14 > blooming contusions in bilateral inferior frontal, bilateral, inferior temporal, and left parieto-occipital lobes.  Generalized increased in SAH, mild IVH without hydrocephalus.  Progressive scalp swelling over left calvarial fracture.  No pneumocephalus.  CT temporal bones 7/14 > longitudinal left temporal bone fx.  Hemotympanum  On left with pneuocephalus and soft tissue emphysema.  EEG 7/14 > mod diffuse encephalopathy.  No seizures or epileptiform activity noted. Head CT 17 >> no change, extensive hemorrhagic contusions in the frontal lobes bilaterally, bifrontal subdural hematoma, diffuse subarachnoid hemorrhage, IVH without hydrocephalus  EEG 7/19 >> Moderate to severe encephalopathy, nonspecific.  No seizures or epileptiform discharges seen  Micro Data:  COVID 7/13 > negative  Blood 7/14 > negative Respiratory 7/16 >> normal flora  7/22 >> Blood 7/16 >> negative Sputum culture 7/22 > few Klebsiella pneumoniae,   Antimicrobials:  Cefepime 7/13 > 7/20 Vanco 7/16 >>  7/20  Ctx 7/25 -->  Interim history/subjective:  Patient continues to fail pressure support trials, goes into respiratory distress and becomes tachypneic  Objective   Blood pressure 111/65, pulse (!) 120, temperature (!) 102.5 F (39.2 C), temperature source Axillary, resp. rate 21, height 6\' 1"  (1.854 m), weight 72 kg, SpO2 95 %.    Vent Mode: PRVC FiO2 (%):  [30 %] 30 % Set Rate:  [15 bmp-16 bmp] 16 bmp Vt Set:  mL]  630 mL PEEP:  [5 cmH20] 5 cmH20 Plateau Pressure:  [20 cmH20] 20 cmH20   Intake/Output Summary (Last 24 hours) at 07/14/2020 4034 Last data  filed at 07/14/2020 0809 Gross per 24 hour  Intake 3462.28 ml  Output 2100 ml  Net 1362.28 ml   Filed Weights   07/08/20 0500 07/11/20 0500 07/13/20 0500  Weight: 70 kg 72.1 kg 72 kg    Examination: General: Chronically ill appearing elderly male, lying in bed on mechanical ventilation via trach  HEENT: 6 cuffed Shiley trach midline, MM pink/moist, PERRL, sclera nonicteric Neuro: Eyes spontaneously open but does not track to movement, will grimace to painful stimuli, not following commands, withdraws in bilateral lower extremities, no movement on upper extremity CV: Slightly tachycardic, s1s2 regular rate and rhythm, no murmur, rubs, or gallops,  PULM: Coarse breath sounds bilaterally, no increased work of breathing, no wheezes  GI: soft, bowel sounds active in all 4 quadrants, non-tender, non-distended, tolerating TF Extremities: warm/dry, no edema  Skin: no rashes or lesions'  Assessment & Plan:   Acute hypoxic respiratory Failure, due to healthcare associated pneumonia with Klebsiella Sepsis due to HCAP -In the setting of bilateral SAH/SDH -S/P percutaneous tracheostomy tube placement 7/24 P:  Patient continues to fail spontaneous breathing trial, leading to respiratory distress and vent was adjusted back to full support Respiratory culture grew Klebsiella, sensitive to ceftriaxone Continue ceftriaxone day 4 of 7 Head of bed elevated 30 degrees. Ensure adequate pulmonary hygiene  VAP bundle in place  PAD protocol  Mechanical fall resulting in Left skull fracture and bilateral subarachnoid/subdural hemorrhage -Received 3% saline for first few days, now off -CT scan showed severe bifrontal contusion/intraparenchymal hemorrhage P: Management per neurosurgery Maintain neuro protective measures; goal for eurothermia, euglycemia, eunatermia, normoxia, and PCO2 goal of 35-40 Nutrition and bowel regiment  Seizure precautions  Keppra for seizure prophylaxis   Persistent acute  traumatic encephalopathy.   -He has not shown any improvement in mental status with time, discontinuation sedation -EEG ruled out acute seizure P: Continue empiric Keppra Supportive care Minimize sedation Mobilize as able  Left longitudinal temporal bone fracture with possible separation of the malleus and incus. -Seen by Dr. Avel Sensor ENT appreciate input and assistance.  No indication for surgery at this time but he will need follow-up, assessment of his hearing, possible middle ear exploration in the future depending on status P: Follow-up in the outpatient setting  Hypertension -Currently well controlled on current regiment  P: Continue amlodipine, HCTZ, and metoprolol As needed hydralazine and labetalol  Diabetes type 2, uncontrolled with hyperglycemia -Blood sugar better controlled with increased to be coverage P: Continue resistant sliding scale insulin Continue Levemir 26 units twice daily Short acting standing insulin held, cont correctional Check CBG every 4 hours  Alcohol intoxication.  - ETOH level 26 on arrival, unsure if this is acute vs chronic P: Has completed thiamine and folate supplementation  Hypernatremia Serum sodium is slowly improving, today it is 149 P: Continue free water flushes to 300 cc every 3 hours Trend bmet  Best practice:  Diet: NPO (tube feeds via PEG) Pain/Anxiety/Delirium protocol (if indicated): PRN  VAP protocol (if indicated): Yes DVT prophylaxis: Subcu heparin GI prophylaxis: PPI Glucose control: SSI/Levemir Mobility: Bedrest  Code Status: Full Family Communication: Per primary team Disposition: ICU  LABS    PULMONARY Recent Labs  Lab 07/10/20 0355  PHART 7.560*  PCO2ART 29.2*  PO2ART 62*  HCO3 26.1  TCO2 27  O2SAT 95.0    CBC  Recent Labs  Lab 07/07/20 1058 07/07/20 1058 07/10/20 0355 07/10/20 0609 07/11/20 0510  HGB 9.9*   < > 9.9* 9.8* 9.3*  HCT 31.3*   < > 29.0* 31.6* 29.5*  WBC 11.6*  --   --  16.3*  16.5*  PLT 248  --   --  PLATELET CLUMPS NOTED ON SMEAR, UNABLE TO ESTIMATE 243   < > = values in this interval not displayed.    COAGULATION Recent Labs  Lab 07/09/20 1145  INR 1.2    CARDIAC  No results for input(s): TROPONINI in the last 168 hours. No results for input(s): PROBNP in the last 168 hours.   CHEMISTRY Recent Labs  Lab 07/07/20 1058 07/10/20 0355 07/10/20 0355 07/10/20 0609 07/10/20 0609 07/11/20 0510 07/11/20 0510 07/12/20 0522 07/14/20 0407  NA  --  147*  --  144  --  151*  --  151* 149*  K  --  4.1   < > 4.1   < > 3.8   < > 4.4 3.7  CL  --   --   --  106  --  112*  --  112* 110  CO2  --   --   --  25  --  26  --  25 25  GLUCOSE  --   --   --  326*  --  198*  --  222* 186*  BUN  --   --   --  53*  --  50*  --  43* 47*  CREATININE 1.14  --   --  1.20  --  0.87  --  0.97 0.93  CALCIUM  --   --   --  10.5*  --  10.5*  --  10.5* 10.1  MG  --   --   --  2.1  --   --   --   --   --   PHOS  --   --   --  3.2  --   --   --   --   --    < > = values in this interval not displayed.   Estimated Creatinine Clearance: 69.9 mL/min (by C-G formula based on SCr of 0.93 mg/dL).   LIVER Recent Labs  Lab 07/09/20 1145  INR 1.2     INFECTIOUS Recent Labs  Lab 07/10/20 0609  PROCALCITON 1.81     ENDOCRINE CBG (last 3)  Recent Labs    07/13/20 2310 07/14/20 0315 07/14/20 0727  GLUCAP 199* 178* 140*     Cheri Fowler MD Critical care physician [facility]  Pager: [phone] Mobile: [phone]

## 2020-07-15 DIAGNOSIS — G9340 Encephalopathy, unspecified: Secondary | ICD-10-CM | POA: Diagnosis not present

## 2020-07-15 DIAGNOSIS — R0682 Tachypnea, not elsewhere classified: Secondary | ICD-10-CM

## 2020-07-15 DIAGNOSIS — Z930 Tracheostomy status: Secondary | ICD-10-CM | POA: Diagnosis not present

## 2020-07-15 DIAGNOSIS — S066X1D Traumatic subarachnoid hemorrhage with loss of consciousness of 30 minutes or less, subsequent encounter: Secondary | ICD-10-CM | POA: Diagnosis not present

## 2020-07-15 DIAGNOSIS — J9601 Acute respiratory failure with hypoxia: Secondary | ICD-10-CM | POA: Diagnosis not present

## 2020-07-15 LAB — GLUCOSE, CAPILLARY
Glucose-Capillary: 111 mg/dL — ABNORMAL HIGH (ref 70–99)
Glucose-Capillary: 135 mg/dL — ABNORMAL HIGH (ref 70–99)
Glucose-Capillary: 144 mg/dL — ABNORMAL HIGH (ref 70–99)
Glucose-Capillary: 147 mg/dL — ABNORMAL HIGH (ref 70–99)
Glucose-Capillary: 163 mg/dL — ABNORMAL HIGH (ref 70–99)
Glucose-Capillary: 175 mg/dL — ABNORMAL HIGH (ref 70–99)

## 2020-07-15 MED ORDER — FREE WATER
250.0000 mL | Status: DC
  Administered 2020-07-15 – 2020-07-18 (×23): 250 mL

## 2020-07-15 MED ORDER — LEVETIRACETAM 100 MG/ML PO SOLN
1000.0000 mg | Freq: Two times a day (BID) | ORAL | Status: DC
  Administered 2020-07-15 – 2020-07-25 (×20): 1000 mg
  Filled 2020-07-15 (×21): qty 10

## 2020-07-15 NOTE — Progress Notes (Signed)
   Providing Compassionate, Quality Care - Together  NEUROSURGERY PROGRESS NOTE   S: No issues overnight.   O: EXAM:  BP 112/69   Pulse (!) 108   Temp 100 F (37.8 C) (Axillary)   Resp 19   Ht 6\' 1"  (1.854 m)   Wt 72 kg   SpO2 99%   BMI 20.94 kg/m   Eyes open to voice PERRL Trach in place Min movement in BUE WD to pain in BLE [place] strongly to pain  Appears to track to the left but not right side   ASSESSMENT:  74 y.o. male with  1. Left occipital parietotemporal calvarial fracture secondary to fall 2.Left temporal contusion 3.Bilateral frontal traumatic subarachnoidhemorrhage & contusions/subdural hematomaswith cerebral edema 4.TBI, severe 5. HCAP  PLAN: -neuro ICU -Q4hr neurochecks -Keppra 1000 mg twice daily(EEG negx2) -s/p PEG -Blood pressure control -ok for SQH -CT brain stable 7/20 -s/p trach -LTAC planning/vent SNF -abx for PNA   Please do not hesitate to call with questions or concerns.   8/20, DO Neurosurgeon [facility] Cell: [phone]

## 2020-07-15 NOTE — Progress Notes (Signed)
Nutrition Follow-up  DOCUMENTATION CODES:   Not applicable  INTERVENTION:   Tube feeding via Cortrak tube; tip gastric: Glucerna 1.2 at 65 ml/h (1560 ml per day) Prosource TF 90 ml BID  200 ml free water every 3 hours  Provides 2032 kcal, 137 gm protein, 1265 ml free water daily Total free water: 2865 ml   NUTRITION DIAGNOSIS:   Inadequate oral intake related to inability to eat as evidenced by NPO status. Ongoing.   GOAL:   Patient will meet greater than or equal to 90% of their needs Met with EN  MONITOR:   TF tolerance, Diet advancement  REASON FOR ASSESSMENT:   Consult Enteral/tube feeding initiation and management  ASSESSMENT:   Pt with PMH of HTN and DM admitted after fall while walking his dog with L skull fx and bilateral SAH/SDH.   Pt discussed during ICU rounds and with RN.  Per CCM pt continues to fail SBT.  Weight stable   7/23 s/p trach placement  7/26 PEG placement   Patient is currently intubated on ventilator support MV: 12.2 L/min Temp (24hrs), Avg:99.3 F (37.4 C), Min:98.6 F (37 C), Max:99.9 F (37.7 C)   Medications reviewed and include: colace, folic acid, SSI, 8 units novolog every 4 hours, 26 units levemir BID, MVI, miralax, senokot   Labs reviewed: Na 149 CBG's: [phone]  Diet Order:   Diet Order            Diet NPO time specified  Diet effective now                 EDUCATION NEEDS:   No education needs have been identified at this time  Skin:  Skin Assessment: Reviewed RN Assessment  Last BM:  7/28  Height:   Ht Readings from Last 1 Encounters:  06/30/20 '6\' 1"'  (1.854 m)    Weight:   Wt Readings from Last 1 Encounters:  07/13/20 72 kg    Ideal Body Weight:  83.6 kg  BMI:  Body mass index is 20.94 kg/m.  Estimated Nutritional Needs:   Kcal:  2100  Protein:  120-130 grams  Fluid:  2 L/day  Lockie Pares., RD, LDN, CNSC See AMiON for contact information

## 2020-07-15 NOTE — Progress Notes (Signed)
PROGRESS NOTE    Harold Mckee  MRN:[id number] DOB: 12/14/1946 DOA: 06/29/2020 PCP: Patient, No Pcp Per    Brief Narrative:  Patient was admitted to the hospital with a working diagnosis of bilateral subarachnoid/subdural hemorrhage secondary to left skull fracture due to mechanical fall.  Hospitalization complicated by sepsis due to Klebsiella pneumonia, endorgan failure acute hypoxic respiratory failure (not present on admission).  74-year-old male who presented after mechanical fall.  He does have significant past medical history for hypertension and type 2 diabetes mellitus.  He fell from ground-level resulting in head injury while walking his dog in the park.  Apparently patient had been drinking alcohol.  On his initial physical examination he was bleeding from his left ear, his vital signs showed a blood pressure 134/76, heart rate 94, temperature 97.4, respiratory 22, oxygen saturation 98%.  He was altered, unable to follow commands, his lungs are clear to auscultation bilaterally, heart S1-S2, present rhythmic, his abdomen was soft, no lower extremity edema.  CT head and spine showed nondisplaced fracture of the left parietal calvarium along the left lambdoid suture extending into the left occipital calvarium and left temporal bone with involvement of the left mastoid air cell and external auditory canal.  Bilateral subdural and subarachnoid hemorrhages.  No midline shift.  Focal hemorrhage with small pockets of pneumocephalus in the left posterior temporal lobe along the calvarial fracture.  Patient was admitted to the neuro intensive care unit, he had frequent neuro checks and received.  Patient was evaluated by ENT, no intervention recommended at this point.  On July 14 patient developed fever and worsening leukocytosis, lactic 5.1, he was started on short course of empiric antibiotic therapy with cefepime and vancomycin for 7 days.  He continued to have persistent  encephalopathy.  July 15 he had progressive worsening mental status, and was emergently intubated and placed on mechanical ventilation.  He was placed on pressure support ventilation, with good toleration but he was unable to protect his airway due to his persistent encephalopathy.  Further work-up with electroencephalography was negative for seizures.  Patient underwent percutaneous tracheostomy on July 24.  Patient had a persistent leukocytosis and significant sputum production.  He was diagnosed with Klebsiella pneumonia on July 25th and placed on intravenous ceftriaxone.    Assessment & Plan:   Principal Problem:   Subarachnoid hemorrhage following injury (HCC) Active Problems:   Encephalopathy acute   Tachypnea   Traumatic hemorrhage of cerebrum with loss of consciousness of 30 minutes or less (HCC)   Acute respiratory failure with hypoxia (HCC)   Status post tracheostomy (HCC)   1. Traumatic left skull fracture with bilateral subarachnoid and subdural hematoma. Patient continue to be poorly responsive and not interactive. No clinical signs of active seizures. He has a poor prognosis. Not able to protect his airway, he is no mandatory mode of mechanical ventilation PRVC.   Continue tube feedings and aspiration precautions. Will change keppra to per tube formulation   2. Acute hypoxic respiratory failure due to ventilator associated pneumonia, complicated with sepsis not present on admission. Personally reviewed chest film from 07.23 with positive infiltrate at the right lower lobe.   Fi02 is 30% with oxygen saturation 100 %.  PRVC Fi02 30%, Peep 5, RR 16, Vt 630 cc.  Wbc continue to be elevated at 16,5. Sputum culture positive for klebsiella resistant to ampicillin and sulbactam.   Continue antibiotic therapy with IV ceftriaxone. Continue to monitor oxygenation and sputum production. Continue trach care and aspiration precautions.     He has failed multiple attempts from  liberation from mechanical ventilation. He is now 14 days post intubation, likely now has ventilator dependent respiratory failure. Poor prognosis.   3. Temporal bone fracture. Continue supportive medical therapy, eventually will need outpatient follow up with ENT.  4. Uncontrolled T2DM Hg A1c 8,5. Glucose today was 186 mg/dl, will continue insulin sliding scale for glucose cover and monitoring.  Basal insulin 26 units bid levemir, plus 8 units q 4 H aspart.  Semaglutide  5. Alcohol intoxication. No clinical signs of withdrawal. Continue supportive medical care  6. Hypernatremia. Na today down to 149 from 151, renal function with serum cr at 0,93 and serum bicarbonate at 25. Will dc HCTZ for now.   Will increase free water to 250 cc q 3 H from 200 cc q 3 H for now and will follow on electrolytes in am.   7. HTN. Continue blood pressure control with metoprolol and amlodipine   Patient continue to be at high risk for worsening encephalopathy and respiratory failure.   Status is: Inpatient  Remains inpatient appropriate because:Inpatient level of care appropriate due to severity of illness   Dispo: The patient is from: Home              Anticipated d/c is to: SNF              Anticipated d/c date is: > 3 days              Patient currently is not medically stable to d/c.   DVT prophylaxis: Enoxaparin   Code Status:   full  Family Communication:  No family at the bedside      Nutrition Status: Nutrition Problem: Inadequate oral intake Etiology: inability to eat Signs/Symptoms: NPO status Interventions: MVI, Prostat, Tube feeding     Consultants:   Surgery   Neurosurgery   CC  Procedures:   Peg tube  Tracheostomy tube   Antimicrobials:   Ceftriaxone     Subjective: Patient is not responsive to touch or voice. Tolerating well tube feedings and mandatory mechanical ventilation   Objective: Vitals:   07/15/20 1123 07/15/20 1125 07/15/20 1200 07/15/20 1300   BP: 110/69  111/69 116/70  Pulse: 88  90 92  Resp: _0 Temp:   99.6 F (37.6 C)   TempSrc:   Axillary   SpO2: 95% 95% 97% 97%  Weight:      Height:        Intake/Output Summary (Last 24 hours) at 07/15/2020 1437 Last data filed at 07/15/2020 1304 Gross per 24 hour  Intake 3344.41 ml  Output 2650 ml  Net 694.41 ml   Filed Weights   07/08/20 0500 07/11/20 0500 07/13/20 0500  Weight: 70 kg 72.1 kg 72 kg    Examination:   General: deconditioned and ill looking appearing  Neurology: not interactive, not responsive to voice or touch  E ENT: mild pallor, oral mucosa dry  Cardiovascular: No JVD. S1-S2 present, rhythmic, no gallops, rubs, or murmurs. No lower extremity edema. Pulmonary: positive  breath sounds bilaterally,  Gastrointestinal. Abdomen peg tube in place Skin. No rashes Musculoskeletal: no joint deformities     Data Reviewed: I have personally reviewed following labs and imaging studies  CBC: Recent Labs  Lab 07/10/20 0355 07/10/20 0609 07/11/20 0510  WBC  --  16.3* 16.5*  HGB 9.9* 9.8* 9.3*  HCT 29.0* 31.6* 29.5*  MCV  --  90.8 89.4  PLT  --  PLATELET  CLUMPS NOTED ON SMEAR, UNABLE TO ESTIMATE 384   Basic Metabolic Panel: Recent Labs  Lab 07/10/20 0355 07/10/20 0609 07/11/20 0510 07/12/20 0522 07/14/20 0407  NA 147* 144 151* 151* 149*  K 4.1 4.1 3.8 4.4 3.7  CL  --  106 112* 112* 110  CO2  --  _0 GLUCOSE  --  326* 198* 222* 186*  BUN  --  53* 50* 43* 47*  CREATININE  --  1.20 0.87 0.97 0.93  CALCIUM  --  10.5* 10.5* 10.5* 10.1  MG  --  2.1  --   --   --   PHOS  --  3.2  --   --   --    GFR: Estimated Creatinine Clearance: 69.9 mL/min (by C-G formula based on SCr of 0.93 mg/dL). Liver Function Tests: No results for input(s): AST, ALT, ALKPHOS, BILITOT, PROT, ALBUMIN in the last 168 hours. No results for input(s): LIPASE, AMYLASE in the last 168 hours. No results for input(s): AMMONIA in the last 168 hours. Coagulation  Profile: Recent Labs  Lab 07/09/20 1145  INR 1.2   Cardiac Enzymes: No results for input(s): CKTOTAL, CKMB, CKMBINDEX, TROPONINI in the last 168 hours. BNP (last 3 results) No results for input(s): PROBNP in the last 8760 hours. HbA1C: No results for input(s): HGBA1C in the last 72 hours. CBG: Recent Labs  Lab 07/14/20 1928 07/14/20 2317 07/15/20 0314 07/15/20 0741 07/15/20 1130  GLUCAP 154* 223* 163* 147* 144*   Lipid Profile: No results for input(s): CHOL, HDL, LDLCALC, TRIG, CHOLHDL, LDLDIRECT in the last 72 hours. Thyroid Function Tests: No results for input(s): TSH, T4TOTAL, FREET4, T3FREE, THYROIDAB in the last 72 hours. Anemia Panel: No results for input(s): VITAMINB12, FOLATE, FERRITIN, TIBC, IRON, RETICCTPCT in the last 72 hours.    Radiology Studies: I have reviewed all of the imaging during this hospital visit personally     Scheduled Meds: . amLODipine  10 mg Per Tube Daily  . chlorhexidine gluconate (MEDLINE KIT)  15 mL Mouth Rinse BID  . Chlorhexidine Gluconate Cloth  6 each Topical Q0600  . docusate  100 mg Per Tube BID  . feeding supplement (PROSource TF)  90 mL Per Tube BID  . free water  200 mL Per Tube Q3H  . heparin  5,000 Units Subcutaneous Q8H  . hydrochlorothiazide  50 mg Per Tube Daily  . insulin aspart  0-20 Units Subcutaneous Q4H  . insulin aspart  8 Units Subcutaneous Q4H  . insulin detemir  26 Units Subcutaneous BID  . mouth rinse  15 mL Mouth Rinse 10 times per day  . metoprolol tartrate  50 mg Per Tube BID  . multivitamin with minerals  1 tablet Per Tube Daily  . pantoprazole sodium  40 mg Per Tube QHS  . polyethylene glycol  17 g Per Tube BID  . Semaglutide(0.25 or 0.5MG/DOS)  0.5067 mg Subcutaneous Q Wed  . sennosides  5 mL Per Tube BID   Continuous Infusions: . sodium chloride 10 mL/hr at 07/15/20 0800  . cefTRIAXone (ROCEPHIN)  IV 1 g (07/15/20 0810)  . dextrose 25 mL/hr at 07/12/20 1636  . feeding supplement (GLUCERNA 1.2  CAL) 1,000 mL (07/15/20 0332)  . levETIRAcetam 1,000 mg (07/15/20 0935)     LOS: 16 days        Muhanad Torosyan Gerome Apley, MD

## 2020-07-15 NOTE — Progress Notes (Signed)
Physical Therapy Treatment Patient Details Name: Harold Mckee MRN: [id number] DOB: 1946-02-15 Today's Date: 07/15/2020    History of Present Illness 74 y o male with HTN and Diabetes who presented after fall resulting in head injury while walking his dog in the park. Per EMS patient was seen drinking alcohol prior to fall. imaging revealing L skull fracture, Bilat frontal SAH/SDH hematomas, and severe TBI. Pt was trached on 7/23, remains on vent. Planned for PEG tube on Monday 7/26.    PT Comments    Pt tolerates treatment well although he still remains unable to follow commands during session. Pt is alert during session with eyes open during entire session but demonstrates no ability to visually track even to stimulation from family. PT provides family education on PROM HEP to maintain current ROM while hospitalized. Pt does demonstrate use of facial muscles to grimace but no other AROM is noted. Pt will benefit from continued acute PT to attempt to arouse and improve participation, however if the pt continues to remain alert and is unable to respond then PT may sign off as the pt has made little progress to this point.   Follow Up Recommendations  LTACH     Equipment Recommendations  Wheelchair (measurements PT);Wheelchair cushion (measurements PT);Hospital bed (mechanical lift if home)    Recommendations for Other Services       Precautions / Restrictions Precautions Precautions: Fall Precaution Comments: alert but unable to follow commands Restrictions Weight Bearing Restrictions: No    Mobility  Bed Mobility Overal bed mobility: Needs Assistance Bed Mobility: Rolling Rolling: Total assist;+2 for physical assistance            Transfers                    Ambulation/Gait                 Stairs             Wheelchair Mobility    Modified Rankin (Stroke Patients Only) Modified Rankin (Stroke Patients Only) Pre-Morbid Rankin Score: No  symptoms Modified Rankin: Severe disability     Balance                                            Cognition Arousal/Alertness: Awake/alert Behavior During Therapy: Flat affect Overall Cognitive Status: Difficult to assess Area of Impairment: Following commands;Rancho level               Rancho Levels of Cognitive Functioning [place]ant Scales of Cognitive Functioning: Generalized response   Current Attention Level: Focused   Following Commands:  (does not follow any commands)       General Comments: pt non-verbal, only response is blink to threat, no tracking of PT or finger      Exercises Other Exercises Other Exercises: PROM bilateral UE including shoulder flexion, abduction, elbow flex/ext, wrist flex/ext for 10 reps Other Exercises: PROM bilateral LE ankle PF/DF, knee flex/ext, hip flex/ext/abd/add 10 reps Other Exercises: PROM ankle PF stretch 1 minut hold    General Comments General comments (skin integrity, edema, etc.): PT provides family education on PROM and not over-stimulating the patient      Pertinent Vitals/Pain Pain Assessment: Faces Faces Pain Scale: Hurts little more Pain Location: grimaces with cervical ROM, wiping of face or use of suction Pain Descriptors / Indicators: Grimacing Pain Intervention(s): Monitored  during session    Home Living                      Prior Function            PT Goals (current goals can now be found in the care plan section) Acute Rehab PT Goals Patient Stated Goal: pt unable to state, daughter's goal is to get pt to respond and participate Progress towards PT goals: Not progressing toward goals - comment    Frequency    Min 2X/week      PT Plan Current plan remains appropriate    Co-evaluation              AM-PAC PT "6 Clicks" Mobility   Outcome Measure  Help needed turning from your back to your side while in a flat bed without using bedrails?:  Total Help needed moving from lying on your back to sitting on the side of a flat bed without using bedrails?: Total Help needed moving to and from a bed to a chair (including a wheelchair)?: Total Help needed standing up from a chair using your arms (e.g., wheelchair or bedside chair)?: Total Help needed to walk in hospital room?: Total Help needed climbing 3-5 steps with a railing? : Total 6 Click Score: 6    End of Session Equipment Utilized During Treatment: Back brace (trach to vent) Activity Tolerance: Patient tolerated treatment well Patient left: in bed;with call bell/phone within reach;with bed alarm set Nurse Communication: Mobility status PT Visit Diagnosis: Muscle weakness (generalized) (M62.81);History of falling (Z91.81);Difficulty in walking, not elsewhere classified (R26.2)     Time: 7902-4097 PT Time Calculation (min) (ACUTE ONLY): 49 min  Charges:  $Therapeutic Exercise: 23-37 mins $Therapeutic Activity: 8-22 mins                     Arlyss Gandy, PT, DPT Acute Rehabilitation Pager: [phone]    Arlyss Gandy 07/15/2020, 5:19 PM

## 2020-07-16 DIAGNOSIS — Z930 Tracheostomy status: Secondary | ICD-10-CM | POA: Diagnosis not present

## 2020-07-16 DIAGNOSIS — G9340 Encephalopathy, unspecified: Secondary | ICD-10-CM | POA: Diagnosis not present

## 2020-07-16 DIAGNOSIS — S066X1D Traumatic subarachnoid hemorrhage with loss of consciousness of 30 minutes or less, subsequent encounter: Secondary | ICD-10-CM | POA: Diagnosis not present

## 2020-07-16 DIAGNOSIS — J9601 Acute respiratory failure with hypoxia: Secondary | ICD-10-CM | POA: Diagnosis not present

## 2020-07-16 LAB — GLUCOSE, CAPILLARY
Glucose-Capillary: 158 mg/dL — ABNORMAL HIGH (ref 70–99)
Glucose-Capillary: 147 mg/dL — ABNORMAL HIGH (ref 70–99)
Glucose-Capillary: 127 mg/dL — ABNORMAL HIGH (ref 70–99)
Glucose-Capillary: 88 mg/dL (ref 70–99)
Glucose-Capillary: 134 mg/dL — ABNORMAL HIGH (ref 70–99)
Glucose-Capillary: 123 mg/dL — ABNORMAL HIGH (ref 70–99)

## 2020-07-16 LAB — CBC WITH DIFFERENTIAL/PLATELET
Abs Immature Granulocytes: 0.47 10*3/uL — ABNORMAL HIGH (ref 0.00–0.07)
Basophils Absolute: 0 10*3/uL (ref 0.0–0.1)
Basophils Relative: 0 %
Eosinophils Absolute: 0 10*3/uL (ref 0.0–0.5)
Eosinophils Relative: 0 %
HCT: 29.1 % — ABNORMAL LOW (ref 39.0–52.0)
Hemoglobin: 8.9 g/dL — ABNORMAL LOW (ref 13.0–17.0)
Immature Granulocytes: 5 %
Lymphocytes Relative: 15 %
Lymphs Abs: 1.4 10*3/uL (ref 0.7–4.0)
MCH: 27.2 pg (ref 26.0–34.0)
MCHC: 30.6 g/dL (ref 30.0–36.0)
MCV: 89 fL (ref 80.0–100.0)
Monocytes Absolute: 0.5 10*3/uL (ref 0.1–1.0)
Monocytes Relative: 5 %
Neutro Abs: 7 10*3/uL (ref 1.7–7.7)
Neutrophils Relative %: 75 %
Platelets: 267 10*3/uL (ref 150–400)
RBC: 3.27 MIL/uL — ABNORMAL LOW (ref 4.22–5.81)
RDW: 14.5 % (ref 11.5–15.5)
WBC: 9.5 10*3/uL (ref 4.0–10.5)
nRBC: 16.5 % — ABNORMAL HIGH (ref 0.0–0.2)

## 2020-07-16 LAB — BASIC METABOLIC PANEL
Anion gap: 12 (ref 5–15)
BUN: 35 mg/dL — ABNORMAL HIGH (ref 8–23)
CO2: 29 mmol/L (ref 22–32)
Calcium: 10.1 mg/dL (ref 8.9–10.3)
Chloride: 106 mmol/L (ref 98–111)
Creatinine, Ser: 0.71 mg/dL (ref 0.61–1.24)
GFR calc Af Amer: >60 mL/min (ref >60)
GFR calc non Af Amer: >60 mL/min (ref >60)
Glucose, Bld: 168 mg/dL — ABNORMAL HIGH (ref 70–99)
Potassium: 3.2 mmol/L — ABNORMAL LOW (ref 3.5–5.1)
Sodium: 147 mmol/L — ABNORMAL HIGH (ref 135–145)

## 2020-07-16 MED ORDER — POTASSIUM CHLORIDE CRYS ER 20 MEQ PO TBCR
40.0000 meq | EXTENDED_RELEASE_TABLET | Freq: Two times a day (BID) | ORAL | Status: DC
  Filled 2020-07-16: qty 2

## 2020-07-16 MED ORDER — POTASSIUM CHLORIDE 20 MEQ PO PACK
40.0000 meq | PACK | Freq: Two times a day (BID) | ORAL | Status: DC
  Administered 2020-07-16 – 2020-07-17 (×3): 40 meq
  Filled 2020-07-16 (×3): qty 2

## 2020-07-16 NOTE — Progress Notes (Signed)
RT NOTE: RT found patient weaning on ventilator (PS/CPAP 10/5) with RR in the low 40's. Patient had also desated to 90%. RT placed patient back on full support. RT removed sutures per CCM as patient is 7 days post trach procedure today. Trach care performed and trach ties changed. Janina Mayo is clean and secure. Drawtex placed around trach. Vitals are stable. RT will continue to monitor.

## 2020-07-16 NOTE — Progress Notes (Signed)
° °  Providing Compassionate, Quality Care - Together  NEUROSURGERY PROGRESS NOTE   S: No issues overnight.  O: EXAM:  BP (!) 133/71    Pulse 90    Temp 99.2 F (37.3 C) (Axillary)    Resp (!) 35    Ht 6\' 1"  (1.854 m)    Wt 71.3 kg    SpO2 95%    BMI 20.74 kg/m   Eyes open spont. PERRL Trach in place Min movement in BUE WD to pain in BLE [place] strongly to pain Appears to track to the left and slightly toward the right   ASSESSMENT:  74 y.o. male with  1. Left occipital parietotemporal calvarial fracture secondary to fall 2.Left temporal contusion 3.Bilateral frontal traumatic subarachnoidhemorrhage & contusions/subdural hematomaswith cerebral edema 4.TBI, severe 5. HCAP  PLAN: -neuro ICU -Q4hr neurochecks -Keppra 1000 mg twice daily(EEG negx2) -s/p PEG -Blood pressure control -ok for SQH -CT brain stable 7/20 -s/p trach -LTAC planning/vent SNF -abx for PNA    Please do not hesitate to call with questions or concerns.   8/20, DO Neurosurgeon [facility] Cell: [phone]

## 2020-07-16 NOTE — Progress Notes (Signed)
NAME:  Harold Mckee, MRN:  [id number], DOB:  12/16/1946, LOS: 17 ADMISSION DATE:  06/29/2020, CONSULTATION DATE:  06/29/2020 REFERRING MD:  Dr. Jake Samples, CHIEF COMPLAINT:  Bilateral subarachnoid/subdural hematomas   Brief History   74 y o male with HTN and Diabetes who presented after ground level fall resulting in head injury while walking his dog in the park. Per EMS patient was seen drinking alcohol prior to fall.   On arrival patient was seen bleeding from his left ear. Vitals on arrival significant for mild tachypnea and hypertension. Labwork significant for hyperglycemia and mild leukocytosis. CT C-spin and head positive for  left scull fracture and bilateral subarachnoid/subdural hematomas. PCCM consulted for assistance ventilator managment  Past Medical History  HTN  Diabetes   Significant Hospital Events   Admitted 7/13  7.15 > Started on empiric cefepime 7/14 due to fever and leukocytosis.  PCT 1.06 and lactate 5.9.  7/25 > sputum culture positive for few Klebsiella pneumonia, IV ceftriaxone started  Consults:  Neurosurgery   ENT 7/15 - Dr Suszanne Conners > Left longitudinal temporal bone fracture with possible separation of the malleus and incus - Cotton ball packing of the left ear canal. Change cotton ball as needed.  The T-bone CT reviewed. No obvious otic capsule involvement.   No acute ENT intervention needed at this time. - The patient will need hearing test as an outpatient in my office. My need middle ear exploration if significant conductive hearing loss is noted.   Procedures:  7/23 tracheostomy 7/25 PEG tube placement  Significant Diagnostic Tests:  CT C-spine and head 7/13 > 1. Nondisplaced fracture of the left parietal calvarium along the left lambdoid suture extending into the left occipital calvarium and left temporal bone with involvement of the left mastoid air cell and external auditory canal. Dedicated temporal bone CT is recommended for further  evaluation. 2. Bilateral subdural and subarachnoid hemorrhages. No midline shift. 3. Focal hemorrhage with small pockets of pneumocephalus in the left posterior temporal lobe along the calvarial fracture. This likely represents combination of subarachnoid and intraparenchymal hemorrhage. 4. No acute/traumatic cervical spine pathology.  CT head 7/14 > blooming contusions in bilateral inferior frontal, bilateral, inferior temporal, and left parieto-occipital lobes.  Generalized increased in SAH, mild IVH without hydrocephalus.  Progressive scalp swelling over left calvarial fracture.  No pneumocephalus.   CT temporal bones 7/14 > longitudinal left temporal bone fx.  Hemotympanum  On left with pneuocephalus and soft tissue emphysema.  EEG 7/14 > mod diffuse encephalopathy.  No seizures or epileptiform activity noted. Head CT 17 >> no change, extensive hemorrhagic contusions in the frontal lobes bilaterally, bifrontal subdural hematoma, diffuse subarachnoid hemorrhage, IVH without hydrocephalus  EEG 7/19 >> Moderate to severe encephalopathy, nonspecific.  No seizures or epileptiform discharges seen  Micro Data:  COVID 7/13 > negative  Blood 7/14 > negative Respiratory 7/16 >> normal flora  7/22 >> Blood 7/16 >> negative Sputum culture 7/22 > few Klebsiella pneumoniae,   Antimicrobials:  Cefepime 7/13 > 7/20 Vanco 7/16 >>  7/20  Ctx 7/25 -->  Interim history/subjective:  Lying in bed, will track with eyes but unable to follow any commands.   Objective   Blood pressure 113/71, pulse 100, temperature 99.1 F (37.3 C), temperature source Oral, resp. rate 18, height 6\' 1"  (1.854 m), weight 71.3 kg, SpO2 96 %.    Vent Mode: PRVC FiO2 (%):  [30 %] 30 % Set Rate:  [16 bmp] 16 bmp Vt Set:  mL] 630  mL PEEP:  [5 cmH20] 5 cmH20 Plateau Pressure:  [19 cmH20-22 cmH20] 20 cmH20   Intake/Output Summary (Last 24 hours) at 07/16/2020 0806 Last data filed at 07/16/2020 0600 Gross per 24  hour  Intake 3640.91 ml  Output 2600 ml  Net 1040.91 ml   Filed Weights   07/11/20 0500 07/13/20 0500 07/16/20 0500  Weight: 72.1 kg 72 kg 71.3 kg    Examination: General: Chronically ill appearing elderly male on mechanical ventilation through trach, in NAD HEENT: 6 cuffed shiley trach midline, MM pink/moist, PERRL,  Neuro: Eyes spontaneously open and appears to track movement but unable to follow commands  CV: s1s2 regular rate and rhythm, no murmur, rubs, or gallops,  PULM:  Clear to ascultation bilaterally, tolerating vent well, no increased work of breathing  GI: soft, bowel sounds active in all 4 quadrants, non-tender, non-distended, tolerating TF Extremities: warm/dry, no edema  Skin: no rashes or lesions  Resolved problems:  Sepsis due to HCAP  Assessment & Plan:   Acute hypoxic respiratory Failure, due to healthcare associated pneumonia with Klebsiella -In the setting of bilateral SAH/SDH -S/P percutaneous tracheostomy tube placement 7/24 P: Continue to trail SBT, unfortunately patient has failed attempts this far with  Continue treatment for HCAP tentative stop date 7/31 Continue ventilator support with lung protective strategies  Wean PEEP and FiO2 for sats greater than 90%. Head of bed elevated 30 degrees. Plateau pressures less than 30 cm H20.  Follow intermittent chest x-ray and ABG.   Ensure adequate pulmonary hygiene  VAP bundle in place  PAD protocol   Rest of acute and chronic medical conditions as follows to now be managed by primary team Mechanical fall resulting in Left skull fracture and bilateral subarachnoid/subdural hemorrhage Persistent acute traumatic encephalopathy Left longitudinal temporal bone fracture with possible separation of the malleus and incus Hypertension Diabetes type 2, uncontrolled with hyperglycemia Alcohol intoxication Hypernatremia  PCCM will continue to follow 1-2x weekly for trach care    Best practice:  Diet: NPO  (tube feeds via PEG) Pain/Anxiety/Delirium protocol (if indicated): PRN  VAP protocol (if indicated): Yes DVT prophylaxis: Subcu heparin GI prophylaxis: PPI Glucose control: SSI/Levemir Mobility: Bedrest  Code Status: Full Family Communication: Per primary team Disposition: ICU  LABS    PULMONARY Recent Labs  Lab 07/10/20 0355  PHART 7.560*  PCO2ART 29.2*  PO2ART 62*  HCO3 26.1  TCO2 27  O2SAT 95.0    CBC Recent Labs  Lab 07/10/20 0609 07/11/20 0510 07/16/20 0433  HGB 9.8* 9.3* 8.9*  HCT 31.6* 29.5* 29.1*  WBC 16.3* 16.5* 9.5  PLT PLATELET CLUMPS NOTED ON SMEAR, UNABLE TO ESTIMATE 243 267    COAGULATION Recent Labs  Lab 07/09/20 1145  INR 1.2    CARDIAC  No results for input(s): TROPONINI in the last 168 hours. No results for input(s): PROBNP in the last 168 hours.   CHEMISTRY Recent Labs  Lab 07/10/20 0609 07/10/20 0609 07/11/20 0510 07/11/20 0510 07/12/20 0522 07/12/20 0522 07/14/20 0407 07/16/20 0433  NA 144  --  151*  --  151*  --  149* 147*  K 4.1   < > 3.8   < > 4.4   < > 3.7 3.2*  CL 106  --  112*  --  112*  --  110 106  CO2 25  --  26  --  25  --  25 29  GLUCOSE 326*  --  198*  --  222*  --  186* 168*  BUN 53*  --  50*  --  43*  --  47* 35*  CREATININE 1.20  --  0.87  --  0.97  --  0.93 0.71  CALCIUM 10.5*  --  10.5*  --  10.5*  --  10.1 10.1  MG 2.1  --   --   --   --   --   --   --   PHOS 3.2  --   --   --   --   --   --   --    < > = values in this interval not displayed.   Estimated Creatinine Clearance: 80.5 mL/min (by C-G formula based on SCr of 0.71 mg/dL).   LIVER Recent Labs  Lab 07/09/20 1145  INR 1.2     INFECTIOUS Recent Labs  Lab 07/10/20 0609  PROCALCITON 1.81     ENDOCRINE CBG (last 3)  Recent Labs    07/15/20 2338 07/16/20 0320 07/16/20 0733  GLUCAP 175* 158* 147*     Signature: [name], NP-C [facility] Contact / Pager information can be found on Amion   07/16/2020, 8:13 AM

## 2020-07-16 NOTE — Progress Notes (Signed)
PROGRESS NOTE    Bobie Kistler  MCN:[id number] DOB: [birth year] DOA: 06/29/2020 PCP: Patient, No Pcp Per    Brief Narrative:  Patient was admitted to the hospital with a working diagnosis of bilateral subarachnoid/subdural hemorrhage secondary to left skull fracture due to mechanical fall.  Hospitalization complicated by sepsis due to Klebsiella pneumonia, endorgan failure acute hypoxic respiratory failure (not present on admission).  74 year old male who presented after mechanical fall.  He does have significant past medical history for hypertension and type 2 diabetes mellitus.  He fell from ground-level resulting in head injury while walking his dog in the park.  Apparently patient had been drinking alcohol.  On his initial physical examination he was bleeding from his left ear, his vital signs showed a blood pressure 134/76, heart rate 94, temperature 97.4, respiratory 22, oxygen saturation 98%.  He was altered, unable to follow commands, his lungs are clear to auscultation bilaterally, heart S1-S2, present rhythmic, his abdomen was soft, no lower extremity edema.  CT head and spine showed nondisplaced fracture of the left parietal calvarium along the left lambdoid suture extending into the left occipital calvarium and left temporal bone with involvement of the left mastoid air cell and external auditory canal.  Bilateral subdural and subarachnoid hemorrhages.  No midline shift.  Focal hemorrhage with small pockets of pneumocephalus in the left posterior temporal lobe along the calvarial fracture.  Patient was admitted to the neuro intensive care unit, he had frequent neuro checks and received.  Patient was evaluated by ENT, no intervention recommended at this point.  On July 14 patient developed fever and worsening leukocytosis, lactic 5.1, he was started on short course of empiric antibiotic therapy with cefepime and vancomycin for 7 days.  He continued to have persistent  encephalopathy.  July 15 he had progressive worsening mental status, and was emergently intubated and placed on mechanical ventilation.  He was placed on pressure support ventilation, with good toleration but he was unable to protect his airway due to his persistent encephalopathy.  Further work-up with electroencephalography was negative for seizures.  Patient underwent percutaneous tracheostomy on July 24.  Patient had a persistent leukocytosis and significant sputum production.  He was diagnosed with Klebsiella pneumonia on July 25th and placed on intravenous ceftriaxone.  Patient continue to failed spontaneous breathing trials.    Assessment & Plan:   Principal Problem:   Subarachnoid hemorrhage following injury ([place]) Active Problems:   Encephalopathy acute   Tachypnea   Traumatic hemorrhage of cerebrum with loss of consciousness of 30 minutes or less (HCC)   Acute respiratory failure with hypoxia ([place])   Status post tracheostomy ([place])   1. Traumatic left skull fracture with bilateral subarachnoid and subdural hematoma. Patient continue to be not responsive and not interactive. Continue with clinical signs of active seizures. Continues to fail spontaneous breathing trials.   Continue supportive medical care, nutrition per tube feedings and aspiration precautions. Very poor prognosis, will talk to his family about goals of care.   Continue with keppra per tube.   2. Acute hypoxic respiratory failure due to ventilator associated pneumonia, complicated with sepsis not present on admission. Personally reviewed chest film from 07.23 with positive infiltrate at the right lower lobe.   Fi02 is 30% with oxygen saturation 100 %.  PRVC Fi02 30%, Peep 5, RR 16, Vt 630 cc.  Sputum culture positive for klebsiella resistant to ampicillin and sulbactam. Wbc is down to 9,5.   Tolerating well IV ceftriaxone. Continue trach care and aspiration  precautions.   Continue to failed  multiple attempts from liberation from mechanical ventilation. He is now 15 days post intubation, likely now has ventilator dependent respiratory failure. Poor prognosis.   3. Temporal bone fracture. Continue supportive medical therapy, eventually will need outpatient follow up with ENT.  4. Uncontrolled T2DM Hg A1c 8,5. Glucose today was 168 mg/dl, On insulin sliding scale for glucose cover and monitoring.  Continue with basal insulin 26 units bid levemir, plus 8 units q 4 H aspart.  On Semaglutide  5. Alcohol intoxication. No current clinical signs of withdrawal. Continue supportive medical care  6. Hypernatremia/ hypokalemia. Na today down to 147. Renal function with serum cr at 0,71 with K 3,2   Continue with free water flushes per peg tube at 250 cc q 3 H.   7. HTN. On metoprolol and amlodipine for blood pressure control   Patient continue to be at high risk for worsening encephalopathy and respiratory failure.    Status is: Inpatient  Remains inpatient appropriate because:Inpatient level of care appropriate due to severity of illness   Dispo: The patient is from: Home              Anticipated d/c is to: SNF              Anticipated d/c date is: > 3 days              Patient currently is not medically stable to d/c.   DVT prophylaxis: Enoxaparin   Code Status:   full  Family Communication:  No family at the bedside      Nutrition Status: Nutrition Problem: Inadequate oral intake Etiology: inability to eat Signs/Symptoms: NPO status Interventions: MVI, Prostat, Tube feeding     Consultants:   CC  Neurosurgery  ENT  Procedures:   Peg   Trach   Antimicrobials:   Ceftriaxone     Subjective: Patient is not interactive.   Objective: Vitals:   07/16/20 1116 07/16/20 1200 07/16/20 1300 07/16/20 1400  BP:  127/72 120/68 123/69  Pulse:  87 90 92  Resp:  (!) '24 23 18  ' Temp:  98.9 F (37.2 C)    TempSrc:  Axillary    SpO2: 95% 91% 93% 93%   Weight:      Height:        Intake/Output Summary (Last 24 hours) at 07/16/2020 1418 Last data filed at 07/16/2020 1400 Gross per 24 hour  Intake 4268.92 ml  Output 1900 ml  Net 2368.92 ml   Filed Weights   07/11/20 0500 07/13/20 0500 07/16/20 0500  Weight: 72.1 kg 72 kg 71.3 kg    Examination:   General: deconditioned and ill looking appearing  Neurology: not following commands, eyes open but no tracking  E ENT: positive pallor, no icterus, oral mucosa moist/ trach in place.  Cardiovascular: No JVD. S1-S2 present, rhythmic, no gallops, rubs, or murmurs. No lower extremity edema. Pulmonary: positive breath sounds bilaterally . Gastrointestinal. Abdomen soft and non tender. Peg tube in place Skin. No rashes Musculoskeletal: no joint deformities     Data Reviewed: I have personally reviewed following labs and imaging studies  CBC: Recent Labs  Lab 07/10/20 0355 07/10/20 0609 07/11/20 0510 07/16/20 0433  WBC  --  16.3* 16.5* 9.5  NEUTROABS  --   --   --  7.0  HGB 9.9* 9.8* 9.3* 8.9*  HCT 29.0* 31.6* 29.5* 29.1*  MCV  --  90.8 89.4 89.0  PLT  --  PLATELET  CLUMPS NOTED ON SMEAR, UNABLE TO ESTIMATE 243 979   Basic Metabolic Panel: Recent Labs  Lab 07/10/20 0609 07/11/20 0510 07/12/20 0522 07/14/20 0407 07/16/20 0433  NA 144 151* 151* 149* 147*  K 4.1 3.8 4.4 3.7 3.2*  CL 106 112* 112* 110 106  CO2 '25 26 25 25 29  ' GLUCOSE 326* 198* 222* 186* 168*  BUN 53* 50* 43* 47* 35*  CREATININE 1.20 0.87 0.97 0.93 0.71  CALCIUM 10.5* 10.5* 10.5* 10.1 10.1  MG 2.1  --   --   --   --   PHOS 3.2  --   --   --   --    GFR: Estimated Creatinine Clearance: 80.5 mL/min (by C-G formula based on SCr of 0.71 mg/dL). Liver Function Tests: No results for input(s): AST, ALT, ALKPHOS, BILITOT, PROT, ALBUMIN in the last 168 hours. No results for input(s): LIPASE, AMYLASE in the last 168 hours. No results for input(s): AMMONIA in the last 168 hours. Coagulation Profile: No  results for input(s): INR, PROTIME in the last 168 hours. Cardiac Enzymes: No results for input(s): CKTOTAL, CKMB, CKMBINDEX, TROPONINI in the last 168 hours. BNP (last 3 results) No results for input(s): PROBNP in the last 8760 hours. HbA1C: No results for input(s): HGBA1C in the last 72 hours. CBG: Recent Labs  Lab 07/15/20 1905 07/15/20 2338 07/16/20 0320 07/16/20 0733 07/16/20 1122  GLUCAP 111* 175* 158* 147* 127*   Lipid Profile: No results for input(s): CHOL, HDL, LDLCALC, TRIG, CHOLHDL, LDLDIRECT in the last 72 hours. Thyroid Function Tests: No results for input(s): TSH, T4TOTAL, FREET4, T3FREE, THYROIDAB in the last 72 hours. Anemia Panel: No results for input(s): VITAMINB12, FOLATE, FERRITIN, TIBC, IRON, RETICCTPCT in the last 72 hours.    Radiology Studies: I have reviewed all of the imaging during this hospital visit personally     Scheduled Meds: . amLODipine  10 mg Per Tube Daily  . chlorhexidine gluconate (MEDLINE KIT)  15 mL Mouth Rinse BID  . Chlorhexidine Gluconate Cloth  6 each Topical Q0600  . docusate  100 mg Per Tube BID  . feeding supplement (PROSource TF)  90 mL Per Tube BID  . free water  250 mL Per Tube Q3H  . heparin  5,000 Units Subcutaneous Q8H  . insulin aspart  0-20 Units Subcutaneous Q4H  . insulin aspart  8 Units Subcutaneous Q4H  . insulin detemir  26 Units Subcutaneous BID  . levETIRAcetam  1,000 mg Per Tube BID  . mouth rinse  15 mL Mouth Rinse 10 times per day  . metoprolol tartrate  50 mg Per Tube BID  . multivitamin with minerals  1 tablet Per Tube Daily  . pantoprazole sodium  40 mg Per Tube QHS  . polyethylene glycol  17 g Per Tube BID  . potassium chloride  40 mEq Per Tube BID  . Semaglutide(0.25 or 0.5MG/DOS)  0.5067 mg Subcutaneous Q Wed  . sennosides  5 mL Per Tube BID   Continuous Infusions: . sodium chloride 10 mL/hr at 07/16/20 1200  . cefTRIAXone (ROCEPHIN)  IV Stopped (07/16/20 8921)  . dextrose 25 mL/hr at  07/12/20 1636  . feeding supplement (GLUCERNA 1.2 CAL) 1,000 mL (07/16/20 1031)     LOS: 17 days        Emonie Espericueta Gerome Apley, MD

## 2020-07-16 NOTE — Progress Notes (Signed)
SLP Cancellation Note  Patient Details Name: Harold Mckee MRN: [id number] DOB: 1946-03-12   Cancelled treatment:       Reason Eval/Treat Not Completed: Patient not medically ready (on vent, not able to follow commands). Will continue to follow.     Mahala Menghini., M.A. CCC-SLP Acute Rehabilitation Services Pager [phone] Office [phone]  07/16/2020, 7:36 AM

## 2020-07-16 NOTE — TOC Progression Note (Signed)
Transition of Care [facility]) - Progression Note    Patient Details  Name: Mahesh Sizemore MRN: [id number] Date of Birth: 01/28/1946  Transition of Care [facility]) CM/SW Contact  Leone Haven, RN Phone Number: 07/16/2020, 5:09 PM  Clinical Narrative:    NCM received a call from [place] with [facility] asking if family had made a decision on ltach.  NCM did not see information for this in chart.  NCM asked Whitney PA if we are looking at ltach for this patient, she states he is still on vent weaning but will need a ltach.  NCM contacted daughter Arlina Robes , she states no one had talked to her about it yet. NCM informed her there are two [facility] and [facility] and explained where each where located.  NCM informed her will have [facility] rep to call her to explain to her about their facility. NCM contacted Cicero Duck and informed her to call daughter.    Expected Discharge Plan: Long Term Acute Care (LTAC) Barriers to Discharge: Continued Medical Work up  Expected Discharge Plan and Services Expected Discharge Plan: Long Term Acute Care (LTAC)   Discharge Planning Services: CM Consult   Living arrangements for the past 2 months: Single Family Home                                       Social Determinants of Health (SDOH) Interventions    Readmission Risk Interventions No flowsheet data found.

## 2020-07-17 LAB — GLUCOSE, CAPILLARY
Glucose-Capillary: 101 mg/dL — ABNORMAL HIGH (ref 70–99)
Glucose-Capillary: 121 mg/dL — ABNORMAL HIGH (ref 70–99)
Glucose-Capillary: 105 mg/dL — ABNORMAL HIGH (ref 70–99)
Glucose-Capillary: 115 mg/dL — ABNORMAL HIGH (ref 70–99)
Glucose-Capillary: 101 mg/dL — ABNORMAL HIGH (ref 70–99)
Glucose-Capillary: 125 mg/dL — ABNORMAL HIGH (ref 70–99)

## 2020-07-17 LAB — BASIC METABOLIC PANEL
Anion gap: 11 (ref 5–15)
BUN: 33 mg/dL — ABNORMAL HIGH (ref 8–23)
CO2: 26 mmol/L (ref 22–32)
Calcium: 10 mg/dL (ref 8.9–10.3)
Chloride: 108 mmol/L (ref 98–111)
Creatinine, Ser: 0.71 mg/dL (ref 0.61–1.24)
GFR calc Af Amer: >60 mL/min (ref >60)
GFR calc non Af Amer: >60 mL/min (ref >60)
Glucose, Bld: 118 mg/dL — ABNORMAL HIGH (ref 70–99)
Potassium: 4.2 mmol/L (ref 3.5–5.1)
Sodium: 145 mmol/L (ref 135–145)

## 2020-07-17 LAB — PATHOLOGIST SMEAR REVIEW

## 2020-07-17 NOTE — Progress Notes (Signed)
No new issues or problems.  Neurologic exam stable.  Changes in vital signs or systemic function.  Status post severe traumatic brain injury.  Continue supportive efforts.

## 2020-07-17 NOTE — Progress Notes (Signed)
I spoke with Harold Mckee daughter, Harold Mckee about his condition and prognosis.  We talked about him having a severe brain injury, not interactive to outside stimuli, dependent on tube feedings and mechanical ventilation to sustain his life.  He does have a high risk of complications, related to his immobility and dependence on artificial interventions to sustain life, complications like pneumonia, aspiration, urine tract infections, and bedsores. We also talked about Harold Mckee current quality of life.   She expressed understanding of the situation, she will talk to the rest of the family to make further advanced directives.  She agrees in consulting palliative care while patient hospitalized.

## 2020-07-17 NOTE — Progress Notes (Signed)
PROGRESS NOTE    Harold Mckee  WLS:[id number] DOB: 1946/04/06 DOA: 06/29/2020 PCP: Patient, No Pcp Per    Brief Narrative:  Patient was admitted to the hospital with a working diagnosis of bilateral subarachnoid/subdural hemorrhage secondary to left skull fracture due to mechanical fall. Hospitalization complicated by sepsis due to Klebsiella pneumonia, endorgan failure acute hypoxic respiratory failure (not present on admission).  74 year old male who presented after mechanical fall. He does have significant past medical history for hypertension and type 2 diabetes mellitus. He fell from ground-level resulting in head injury while walking his dog in the park. Apparently patient had been drinking alcohol. On his initial physical examination he was bleeding from his left ear, his vital signs showed a blood pressure 134/76, heart rate 94, temperature 97.4, respiratory 22, oxygen saturation 98%. He was altered, unable to follow commands, his lungs are clear to auscultation bilaterally, heart S1-S2, present rhythmic, his abdomen was soft, no lower extremity edema.  CT head and spine showed nondisplaced fracture of the left parietal calvarium along the leftlambdoid suture extending into the left occipital calvarium and left temporal bone with involvement of the left mastoid air cell and external auditory canal. Bilateral subdural and subarachnoid hemorrhages. No midline shift. Focal hemorrhage with small pockets of pneumocephalus in the left posterior temporal lobe along the calvarial fracture.  Patient was admitted to the neuro intensive care unit, he had frequent neuro checks and received.  Patient was evaluated by ENT, no intervention recommended at this point.  On July 14 patient developed fever and worsening leukocytosis, lactic 5.1,he was started onshort course ofempiric antibiotic therapy with cefepimeand vancomycin for 7 days. He continued to have persistent  encephalopathy.  July 15 he had progressive worsening mental status, and was emergently intubated and placed on mechanical ventilation.  He was placed on pressure support ventilation, with good toleration but he was unable to protect his airway due to his persistent encephalopathy. Further work-up with electroencephalography wasnegative for seizures.  Patient underwent percutaneous tracheostomy on July24.Patient had a persistent leukocytosis and significant sputum production. He was diagnosed with Klebsiella pneumonia on July 25th and placed on intravenous ceftriaxone.  Patient continue to failed spontaneous breathing trials.     Assessment & Plan:   Principal Problem:   Subarachnoid hemorrhage following injury ([place]) Active Problems:   Encephalopathy acute   Tachypnea   Traumatic hemorrhage of cerebrum with loss of consciousness of 30 minutes or less (HCC)   Acute respiratory failure with hypoxia ([place])   Status post tracheostomy ([place])   1. Traumatic left skull fracture with bilateral subarachnoid and subdural hematoma. Not responsive or interactive. Noclinical signs signs of active seizures.  He is not able to tolerate SBT due to rapid increase work of breathing. He has been maintained on mandatory mode of mechanical ventilation.   Trach care and aspiration precautions. Patient with very poor prognosis.   Onkeppra per tube.   2. Acute hypoxic respiratory failure due to ventilator associated pneumonia, complicated with sepsis not present on admission. Personally reviewed chest film from 07.23 with positive infiltrate at the right lower lobe. Sputum culture positive for klebsiella resistant to ampicillin and sulbactam.   Fi02 is 30% with oxygen saturation 100 %.  PRVC Fi02 30%, Peep 5, RR 16, Vt 630 cc.  Completed antibiotic therapy.   Patient with ventilator dependent respiratory failure. Poor prognosis.   3. Temporal bone fracture. Continue supportive medical  therapy, eventually will need outpatient follow up with ENT.  4. Uncontrolled T2DM Hg A1c  8,5. Glucose today was 118 mg/dl, Continue with insulin sliding scale for glucose cover and monitoring.  Basal insulin 26 units bid levemir, plus 8 units q 4 H aspart. Continue with Semaglutide  5. Alcohol intoxication. No clinical signs of withdrawal.   6. Hypernatremia/ hypokalemia. Sodium trending down, today at 145 with K at 4,2 and serum bicarbonate at 26,   Free water flushes per peg tube at 250 cc q 3 H. Follow renal panel in am.   7. HTN. continue with metoprolol and amlodipinefor blood pressure control     Status is: Inpatient  Remains inpatient appropriate because:Inpatient level of care appropriate due to severity of illness   Dispo: The patient is from: Home              Anticipated d/c is to: LTAC              Anticipated d/c date is: 3 days              Patient currently is not medically stable to d/c.   DVT prophylaxis: scd   Code Status:   full  Family Communication:  No family at the bedside      Nutrition Status: Nutrition Problem: Inadequate oral intake Etiology: inability to eat Signs/Symptoms: NPO status Interventions: MVI, Prostat, Tube feeding     Skin Documentation:     Consultants:   ENT   Neurosurgery   Procedures:   Trach   Peg   Antimicrobials:       Subjective: Patient is not interactive, has been tolerating tube feedings, not able to liberate from mechanical ventilation.   Objective: Vitals:   07/17/20 1000 07/17/20 1126 07/17/20 1127 07/17/20 1200  BP: 124/69 110/66  110/67  Pulse: 95 86  82  Resp: '20 19  21  ' Temp:    99.5 F (37.5 C)  TempSrc:    Axillary  SpO2: 97% 97% 97% 97%  Weight:      Height:        Intake/Output Summary (Last 24 hours) at 07/17/2020 1527 Last data filed at 07/17/2020 1200 Gross per 24 hour  Intake 3959.77 ml  Output 1250 ml  Net 2709.77 ml   Filed Weights   07/13/20 0500 07/16/20  0500 07/17/20 0433  Weight: 72 kg 71.3 kg 72.5 kg    Examination:   General: deconditioned  Neurology: opens his eyes but not interactive  E ENT: no pallor, no icterus, oral mucosa moist. Trach in place.  Cardiovascular: No JVD. S1-S2 present, rhythmic, no gallops, rubs, or murmurs. No lower extremity edema. Pulmonary: vesicular breath sounds bilaterally, adequate air movement, no wheezing, rhonchi or rales. Gastrointestinal. Abdomen soft and non tender, peg tube in place Skin. No rashes Musculoskeletal: no joint deformities     Data Reviewed: I have personally reviewed following labs and imaging studies  CBC: Recent Labs  Lab 07/11/20 0510 07/16/20 0433  WBC 16.5* 9.5  NEUTROABS  --  7.0  HGB 9.3* 8.9*  HCT 29.5* 29.1*  MCV 89.4 89.0  PLT 243 808   Basic Metabolic Panel: Recent Labs  Lab 07/11/20 0510 07/12/20 0522 07/14/20 0407 07/16/20 0433 07/17/20 0453  NA 151* 151* 149* 147* 145  K 3.8 4.4 3.7 3.2* 4.2  CL 112* 112* 110 106 108  CO2 '26 25 25 29 26  ' GLUCOSE 198* 222* 186* 168* 118*  BUN 50* 43* 47* 35* 33*  CREATININE 0.87 0.97 0.93 0.71 0.71  CALCIUM 10.5* 10.5* 10.1 10.1 10.0   GFR:  Estimated Creatinine Clearance: 81.8 mL/min (by C-G formula based on SCr of 0.71 mg/dL). Liver Function Tests: No results for input(s): AST, ALT, ALKPHOS, BILITOT, PROT, ALBUMIN in the last 168 hours. No results for input(s): LIPASE, AMYLASE in the last 168 hours. No results for input(s): AMMONIA in the last 168 hours. Coagulation Profile: No results for input(s): INR, PROTIME in the last 168 hours. Cardiac Enzymes: No results for input(s): CKTOTAL, CKMB, CKMBINDEX, TROPONINI in the last 168 hours. BNP (last 3 results) No results for input(s): PROBNP in the last 8760 hours. HbA1C: No results for input(s): HGBA1C in the last 72 hours. CBG: Recent Labs  Lab 07/16/20 1923 07/16/20 2313 07/17/20 0315 07/17/20 0737 07/17/20 1132  GLUCAP 134* 123* 101* 121* 105*    Lipid Profile: No results for input(s): CHOL, HDL, LDLCALC, TRIG, CHOLHDL, LDLDIRECT in the last 72 hours. Thyroid Function Tests: No results for input(s): TSH, T4TOTAL, FREET4, T3FREE, THYROIDAB in the last 72 hours. Anemia Panel: No results for input(s): VITAMINB12, FOLATE, FERRITIN, TIBC, IRON, RETICCTPCT in the last 72 hours.    Radiology Studies: I have reviewed all of the imaging during this hospital visit personally     Scheduled Meds: . amLODipine  10 mg Per Tube Daily  . chlorhexidine gluconate (MEDLINE KIT)  15 mL Mouth Rinse BID  . Chlorhexidine Gluconate Cloth  6 each Topical Q0600  . docusate  100 mg Per Tube BID  . feeding supplement (PROSource TF)  90 mL Per Tube BID  . free water  250 mL Per Tube Q3H  . heparin  5,000 Units Subcutaneous Q8H  . insulin aspart  0-20 Units Subcutaneous Q4H  . insulin aspart  8 Units Subcutaneous Q4H  . insulin detemir  26 Units Subcutaneous BID  . levETIRAcetam  1,000 mg Per Tube BID  . mouth rinse  15 mL Mouth Rinse 10 times per day  . metoprolol tartrate  50 mg Per Tube BID  . multivitamin with minerals  1 tablet Per Tube Daily  . pantoprazole sodium  40 mg Per Tube QHS  . polyethylene glycol  17 g Per Tube BID  . potassium chloride  40 mEq Per Tube BID  . Semaglutide(0.25 or 0.5MG/DOS)  0.5067 mg Subcutaneous Q Wed  . sennosides  5 mL Per Tube BID   Continuous Infusions: . sodium chloride 10 mL/hr at 07/17/20 1200  . feeding supplement (GLUCERNA 1.2 CAL) 1,000 mL (07/17/20 0846)     LOS: 18 days        Tala Eber Gerome Apley, MD

## 2020-07-18 LAB — BASIC METABOLIC PANEL
Anion gap: 9 (ref 5–15)
BUN: 30 mg/dL — ABNORMAL HIGH (ref 8–23)
CO2: 24 mmol/L (ref 22–32)
Calcium: 9.5 mg/dL (ref 8.9–10.3)
Chloride: 109 mmol/L (ref 98–111)
Creatinine, Ser: 0.59 mg/dL — ABNORMAL LOW (ref 0.61–1.24)
GFR calc Af Amer: >60 mL/min (ref >60)
GFR calc non Af Amer: >60 mL/min (ref >60)
Glucose, Bld: 108 mg/dL — ABNORMAL HIGH (ref 70–99)
Potassium: 4.3 mmol/L (ref 3.5–5.1)
Sodium: 142 mmol/L (ref 135–145)

## 2020-07-18 LAB — GLUCOSE, CAPILLARY
Glucose-Capillary: 111 mg/dL — ABNORMAL HIGH (ref 70–99)
Glucose-Capillary: 84 mg/dL (ref 70–99)
Glucose-Capillary: 172 mg/dL — ABNORMAL HIGH (ref 70–99)
Glucose-Capillary: 103 mg/dL — ABNORMAL HIGH (ref 70–99)
Glucose-Capillary: 79 mg/dL (ref 70–99)
Glucose-Capillary: 108 mg/dL — ABNORMAL HIGH (ref 70–99)

## 2020-07-18 MED ORDER — FREE WATER
250.0000 mL | Freq: Four times a day (QID) | Status: DC
  Administered 2020-07-18 – 2020-07-25 (×29): 250 mL

## 2020-07-18 NOTE — Progress Notes (Signed)
PROGRESS NOTE  Harold Mckee VZD:[id number] DOB: 10/21/1946 DOA: 06/29/2020 PCP: Patient, No Pcp Per  HPI/Recap of past 24 hours: Patient was admitted to the hospital with a working diagnosis of bilateral subarachnoid/subdural hemorrhage secondary to left skull fracture due to mechanical fall. Hospitalization complicated by sepsis due to Klebsiella pneumonia, endorgan failure acute hypoxic respiratory failure (not present on admission).  74 year old male who presented after mechanical fall. He does have significant past medical history for hypertension and type 2 diabetes mellitus. He fell from ground-level resulting in head injury while walking his dog in the park. Apparently patient had been drinking alcohol. On his initial physical examination he was bleeding from his left ear, his vital signs showed a blood pressure 134/76, heart rate 94, temperature 97.4, respiratory 22, oxygen saturation 98%. He was altered, unable to follow commands, his lungs are clear to auscultation bilaterally, heart S1-S2, present rhythmic, his abdomen was soft, no lower extremity edema.  CT head and spine showed nondisplaced fracture of the left parietal calvarium along the leftlambdoid suture extending into the left occipital calvarium and left temporal bone with involvement of the left mastoid air cell and external auditory canal. Bilateral subdural and subarachnoid hemorrhages. No midline shift. Focal hemorrhage with small pockets of pneumocephalus in the left posterior temporal lobe along the calvarial fracture.  Patient was admitted to the neuro intensive care unit, he had frequent neuro checks and received.  Patient was evaluated by ENT, no intervention recommended at this point.  On July 14 patient developed fever and worsening leukocytosis, lactic 5.1,he was started onshort course ofempiric antibiotic therapy with cefepimeand vancomycin for 7 days. He continued to have persistent  encephalopathy.  July 15 he had progressive worsening mental status, and was emergently intubated and placed on mechanical ventilation.  He was placed on pressure support ventilation, with good toleration but he was unable to protect his airway due to his persistent encephalopathy. Further work-up with electroencephalography wasnegative for seizures.  Patient underwent percutaneous tracheostomy on July24.Patient had a persistent leukocytosis and significant sputum production. He was diagnosed with Klebsiella pneumonia on July 25th and placed on intravenous ceftriaxone.  Patient continue to failed spontaneous breathing trials.  07/18/20:  Responds to painful stimuli.  Does not follow commands.  Tachypneic this AM.   Assessment/Plan: Principal Problem:   Subarachnoid hemorrhage following injury ([place]) Active Problems:   Encephalopathy acute   Tachypnea   Traumatic hemorrhage of cerebrum with loss of consciousness of 30 minutes or less (HCC)   Acute respiratory failure with hypoxia (HCC)   Status post tracheostomy ([place])  1. Traumatic left skull fracture with bilateral subarachnoid and subdural hematoma. Notresponsive or interactive.Noclinical signs of active seizures.  He is not able to tolerate SBT due to rapid increase work of breathing. He has been maintained on mandatory mode of mechanical ventilation.   Trach care and aspiration precautions. Patient with very poor prognosis.   On keppra per tube.  2. Acute hypoxic respiratory failure due to ventilator associated pneumonia, complicated with sepsis not present on admission. Personally reviewed chest film from 07.23 with positive infiltrate at the right lower lobe.Sputum culture positive for klebsiella resistant to ampicillin and sulbactam.  Fi02 is 30% with oxygen saturation 100 %.  PRVC Fi02 30%, Peep 5, RR 16, Vt 630 cc.  Completed antibiotic therapy.   Patient with ventilator dependent respiratory failure.  Poor prognosis.   3. Temporal bone fracture. Continue supportive medical therapy, eventually will need outpatient follow up with ENT.  Seen by neurosurgery.  4. Uncontrolled T2DM Hg A1c 8,5. Glucose today was 135m/dl, Continue withinsulin sliding scale for glucose cover and monitoring.  Basalinsulin 26 units bid levemir, plus 8 units q 4 H aspart. Continue withSemaglutide  5. Alcohol intoxication. Noclinical signs of withdrawal.   6. Resolved Hypernatremia/ hypokalemia. Sodium trending down, today at 145> 142 with K at 4.2> 4.3 and serum bicarbonate at 26,   Free water flushes per peg tube at250 cc q 3 H.Follow renal panel in am.   7. HTN.continue withmetoprolol and amlodipinefor blood pressure control    Status is: Inpatient  Remains inpatient appropriate because:Inpatient level of care appropriate due to severity of illness   Dispo: The patient is from: Home  Anticipated d/c is to: LTAC  Anticipated d/c date is: 07/20/20 Pending placement.  Patient currently is not medically stable to d/c.   DVT prophylaxis:      scd   Code Status:              full  Family Communication:       No family at the bedside      Nutrition Status: Nutrition Problem: Inadequate oral intake Etiology: inability to eat Signs/Symptoms: NPO status Interventions: MVI, Prostat, Tube feeding     Skin Documentation:   Consultants:   ENT   Neurosurgery   Procedures:   Trach   Peg            Objective: Vitals:   07/18/20 0800 07/18/20 0821 07/18/20 0900 07/18/20 1000  BP:  109/69 124/73 (!) 136/73  Pulse:  100 102 100  Resp:  20 (!) 25 (!) 27  Temp: 99.7 F (37.6 C)     TempSrc: Axillary     SpO2:  97% 97% 96%  Weight:      Height:        Intake/Output Summary (Last 24 hours) at 07/18/2020 1119 Last data filed at 07/18/2020 1000 Gross per 24 hour  Intake 3644.99 ml  Output 2300 ml  Net 1344.99 ml    Filed Weights   07/16/20 0500 07/17/20 0433 07/18/20 0349  Weight: 71.3 kg 72.5 kg 72.5 kg    Exam:  . General: 74y.o. year-old male well developed well nourished in no acute distress.  Eyes open. Responds to painful stimuli.  Does not follow commands. . Cardiovascular: Regular rate and rhythm with no rubs or gallops.  No thyromegaly or JVD noted.   .Marland KitchenRespiratory: Clear to auscultation with no wheezes or rales.  . Abdomen: Soft nontender nondistended with normal bowel sounds.  Peg tube in place. . Musculoskeletal: No lower extremity edema. .Marland KitchenPsychiatry: Unable to assess mood due to altered mental status.   Data Reviewed: CBC: Recent Labs  Lab 07/16/20 0433  WBC 9.5  NEUTROABS 7.0  HGB 8.9*  HCT 29.1*  MCV 89.0  PLT 2801  Basic Metabolic Panel: Recent Labs  Lab 07/12/20 0522 07/14/20 0407 07/16/20 0433 07/17/20 0453 07/18/20 0419  NA 151* 149* 147* 145 142  K 4.4 3.7 3.2* 4.2 4.3  CL 112* 110 106 108 109  CO2 '25 25 29 26 24  ' GLUCOSE 222* 186* 168* 118* 108*  BUN 43* 47* 35* 33* 30*  CREATININE 0.97 0.93 0.71 0.71 0.59*  CALCIUM 10.5* 10.1 10.1 10.0 9.5   GFR: Estimated Creatinine Clearance: 81.8 mL/min (A) (by C-G formula based on SCr of 0.59 mg/dL (L)). Liver Function Tests: No results for input(s): AST, ALT, ALKPHOS, BILITOT, PROT, ALBUMIN in the last 168 hours. No results for  input(s): LIPASE, AMYLASE in the last 168 hours. No results for input(s): AMMONIA in the last 168 hours. Coagulation Profile: No results for input(s): INR, PROTIME in the last 168 hours. Cardiac Enzymes: No results for input(s): CKTOTAL, CKMB, CKMBINDEX, TROPONINI in the last 168 hours. BNP (last 3 results) No results for input(s): PROBNP in the last 8760 hours. HbA1C: No results for input(s): HGBA1C in the last 72 hours. CBG: Recent Labs  Lab 07/17/20 1539 07/17/20 1920 07/17/20 2311 07/18/20 0313 07/18/20 0726  GLUCAP 115* 101* 125* 111* 84   Lipid Profile: No results  for input(s): CHOL, HDL, LDLCALC, TRIG, CHOLHDL, LDLDIRECT in the last 72 hours. Thyroid Function Tests: No results for input(s): TSH, T4TOTAL, FREET4, T3FREE, THYROIDAB in the last 72 hours. Anemia Panel: No results for input(s): VITAMINB12, FOLATE, FERRITIN, TIBC, IRON, RETICCTPCT in the last 72 hours. Urine analysis:    Component Value Date/Time   COLORURINE YELLOW 06/30/2020 1650   APPEARANCEUR CLEAR 06/30/2020 1650   LABSPEC 1.018 06/30/2020 1650   PHURINE 5.0 06/30/2020 1650   GLUCOSEU >=500 (A) 06/30/2020 1650   HGBUR SMALL (A) 06/30/2020 1650   BILIRUBINUR NEGATIVE 06/30/2020 1650   KETONESUR 5 (A) 06/30/2020 1650   PROTEINUR NEGATIVE 06/30/2020 1650   NITRITE NEGATIVE 06/30/2020 1650   LEUKOCYTESUR NEGATIVE 06/30/2020 1650   Sepsis Labs: '@LABRCNTIP' (procalcitonin:4,lacticidven:4)  ) Recent Results (from the past 240 hour(s))  Culture, respiratory (non-expectorated)     Status: None   Collection Time: 07/08/20 11:22 AM   Specimen: Tracheal Aspirate; Respiratory  Result Value Ref Range Status   Specimen Description TRACHEAL ASPIRATE  Final   Special Requests NONE  Final   Gram Stain   Final    RARE WBC PRESENT, PREDOMINANTLY PMN FEW GRAM POSITIVE COCCI RARE GRAM NEGATIVE RODS RARE GRAM POSITIVE RODS Performed at [facility], [place] [address] [place], [place] [address]    Culture FEW KLEBSIELLA PNEUMONIAE  Final   Report Status 07/10/2020 FINAL  Final   Organism ID, Bacteria KLEBSIELLA PNEUMONIAE  Final      Susceptibility   Klebsiella pneumoniae - MIC*    AMPICILLIN >=32 RESISTANT Resistant     CEFAZOLIN <=4 SENSITIVE Sensitive     CEFEPIME <=0.12 SENSITIVE Sensitive     CEFTAZIDIME <=1 SENSITIVE Sensitive     CEFTRIAXONE <=0.25 SENSITIVE Sensitive     CIPROFLOXACIN <=0.25 SENSITIVE Sensitive     GENTAMICIN <=1 SENSITIVE Sensitive     IMIPENEM 1 SENSITIVE Sensitive     TRIMETH/SULFA <=20 SENSITIVE Sensitive     AMPICILLIN/SULBACTAM >=32 RESISTANT  Resistant     PIP/TAZO <=4 SENSITIVE Sensitive     * FEW KLEBSIELLA PNEUMONIAE      Studies: No results found.  Scheduled Meds: . amLODipine  10 mg Per Tube Daily  . chlorhexidine gluconate (MEDLINE KIT)  15 mL Mouth Rinse BID  . Chlorhexidine Gluconate Cloth  6 each Topical Q0600  . docusate  100 mg Per Tube BID  . feeding supplement (PROSource TF)  90 mL Per Tube BID  . free water  250 mL Per Tube Q3H  . heparin  5,000 Units Subcutaneous Q8H  . insulin aspart  0-20 Units Subcutaneous Q4H  . insulin aspart  8 Units Subcutaneous Q4H  . insulin detemir  26 Units Subcutaneous BID  . levETIRAcetam  1,000 mg Per Tube BID  . mouth rinse  15 mL Mouth Rinse 10 times per day  . metoprolol tartrate  50 mg Per Tube BID  . multivitamin with  minerals  1 tablet Per Tube Daily  . pantoprazole sodium  40 mg Per Tube QHS  . polyethylene glycol  17 g Per Tube BID  . Semaglutide(0.25 or 0.5MG/DOS)  0.5067 mg Subcutaneous Q Wed  . sennosides  5 mL Per Tube BID    Continuous Infusions: . sodium chloride 10 mL/hr at 07/18/20 0700  . feeding supplement (GLUCERNA 1.2 CAL) 1,000 mL (07/17/20 0846)     LOS: 19 days     Kayleen Memos, MD [facility] Pager [phone]  If 7PM-7AM, please contact night-coverage [email or web address] Password [email or web address] 07/18/2020, 11:19 AM

## 2020-07-18 NOTE — Progress Notes (Signed)
Patient ID: Harold Mckee, male   DOB: Sep 02, 1946, 74 y.o.   MRN: [id number]      Thank you for this consult. Chart reviewed.  I spoke with daughter Arlina Robes in efforts to arrange family meeting, they are unable to meet today. Plan for GOC meeting tomorrow 07/19/20.    Sherlean Foot, NP-C Palliative Medicine   Please call Palliative Medicine team phone with any questions [phone]. For individual providers please see AMION.   No charge

## 2020-07-18 NOTE — Progress Notes (Signed)
No significant change in status.  Patient will awaken to stimuli but does not appear to be particularly aware.  Pupils are equal.  Tracheostomy in place.  Some withdrawal of his upper extremities and lower extremity noxious stimuli nothing to command nothing purposefully.  Status post severe traumatic brain injury.  Continue supportive efforts.

## 2020-07-19 DIAGNOSIS — L8990 Pressure ulcer of unspecified site, unspecified stage: Secondary | ICD-10-CM | POA: Insufficient documentation

## 2020-07-19 LAB — BASIC METABOLIC PANEL
Anion gap: 10 (ref 5–15)
BUN: 26 mg/dL — ABNORMAL HIGH (ref 8–23)
CO2: 26 mmol/L (ref 22–32)
Calcium: 9.2 mg/dL (ref 8.9–10.3)
Chloride: 106 mmol/L (ref 98–111)
Creatinine, Ser: 0.56 mg/dL — ABNORMAL LOW (ref 0.61–1.24)
GFR calc Af Amer: >60 mL/min (ref >60)
GFR calc non Af Amer: >60 mL/min (ref >60)
Glucose, Bld: 99 mg/dL (ref 70–99)
Potassium: 4.3 mmol/L (ref 3.5–5.1)
Sodium: 142 mmol/L (ref 135–145)

## 2020-07-19 LAB — GLUCOSE, CAPILLARY
Glucose-Capillary: 104 mg/dL — ABNORMAL HIGH (ref 70–99)
Glucose-Capillary: 107 mg/dL — ABNORMAL HIGH (ref 70–99)
Glucose-Capillary: 121 mg/dL — ABNORMAL HIGH (ref 70–99)
Glucose-Capillary: 148 mg/dL — ABNORMAL HIGH (ref 70–99)
Glucose-Capillary: 92 mg/dL (ref 70–99)
Glucose-Capillary: 97 mg/dL (ref 70–99)

## 2020-07-19 MED ORDER — INSULIN ASPART 100 UNIT/ML ~~LOC~~ SOLN
4.0000 [IU] | SUBCUTANEOUS | Status: DC
  Administered 2020-07-19 – 2020-07-22 (×15): 4 [IU] via SUBCUTANEOUS

## 2020-07-19 MED ORDER — INSULIN ASPART 100 UNIT/ML ~~LOC~~ SOLN
0.0000 [IU] | SUBCUTANEOUS | Status: DC
  Administered 2020-07-19 – 2020-07-20 (×2): 1 [IU] via SUBCUTANEOUS
  Administered 2020-07-20: 2 [IU] via SUBCUTANEOUS
  Administered 2020-07-20 – 2020-07-21 (×4): 1 [IU] via SUBCUTANEOUS
  Administered 2020-07-21: 2 [IU] via SUBCUTANEOUS
  Administered 2020-07-21 (×2): 1 [IU] via SUBCUTANEOUS
  Administered 2020-07-21: 2 [IU] via SUBCUTANEOUS
  Administered 2020-07-21: 1 [IU] via SUBCUTANEOUS
  Administered 2020-07-22 (×2): 2 [IU] via SUBCUTANEOUS
  Administered 2020-07-22: 1 [IU] via SUBCUTANEOUS
  Administered 2020-07-22: 2 [IU] via SUBCUTANEOUS
  Administered 2020-07-22: 1 [IU] via SUBCUTANEOUS
  Administered 2020-07-23: 2 [IU] via SUBCUTANEOUS
  Administered 2020-07-23: 3 [IU] via SUBCUTANEOUS
  Administered 2020-07-23 – 2020-07-24 (×6): 2 [IU] via SUBCUTANEOUS
  Administered 2020-07-24: 3 [IU] via SUBCUTANEOUS
  Administered 2020-07-24 – 2020-07-25 (×5): 2 [IU] via SUBCUTANEOUS
  Administered 2020-07-25 (×3): 3 [IU] via SUBCUTANEOUS

## 2020-07-19 MED ORDER — INSULIN DETEMIR 100 UNIT/ML ~~LOC~~ SOLN
15.0000 [IU] | Freq: Two times a day (BID) | SUBCUTANEOUS | Status: DC
  Administered 2020-07-19 – 2020-07-21 (×5): 15 [IU] via SUBCUTANEOUS
  Filled 2020-07-19 (×7): qty 0.15

## 2020-07-19 NOTE — Progress Notes (Signed)
SLP Cancellation Note  Patient Details Name: Harold Mckee MRN: [id number] DOB: 06/06/1946   Cancelled treatment:       Reason Eval/Treat Not Completed: Medical issues which prohibited therapy. Pt on vent, not appropriate for inline PMV due to mentation. Note that plan is for family meeting with palliative care team on next date. Will f/u as indicated.   Mahala Menghini., M.A. CCC-SLP Acute Rehabilitation Services Pager [phone] Office [phone]  07/19/2020, 7:23 AM

## 2020-07-19 NOTE — Progress Notes (Signed)
PROGRESS NOTE  Harold Mckee ZOX:[id number] DOB: 01/18/1946 DOA: 06/29/2020 PCP: Patient, No Pcp Per  HPI/Recap of past 24 hours: Patient was admitted to the hospital with a working diagnosis of bilateral subarachnoid/subdural hemorrhage secondary to left skull fracture due to mechanical fall. Hospitalization complicated by sepsis due to Klebsiella pneumonia, endorgan failure acute hypoxic respiratory failure (not present on admission).  74 year old male who presented after mechanical fall. He does have significant past medical history for hypertension and type 2 diabetes mellitus. He fell from ground-level resulting in head injury while walking his dog in the park. Apparently patient had been drinking alcohol. On his initial physical examination he was bleeding from his left ear, his vital signs showed a blood pressure 134/76, heart rate 94, temperature 97.4, respiratory 22, oxygen saturation 98%. He was altered, unable to follow commands, his lungs are clear to auscultation bilaterally, heart S1-S2, present rhythmic, his abdomen was soft, no lower extremity edema.  CT head and spine showed nondisplaced fracture of the left parietal calvarium along the leftlambdoid suture extending into the left occipital calvarium and left temporal bone with involvement of the left mastoid air cell and external auditory canal. Bilateral subdural and subarachnoid hemorrhages. No midline shift. Focal hemorrhage with small pockets of pneumocephalus in the left posterior temporal lobe along the calvarial fracture.  Patient was admitted to the neuro intensive care unit, he had frequent neuro checks and received.  Patient was evaluated by ENT, no intervention recommended at this point.  On July 14 patient developed fever and worsening leukocytosis, lactic 5.1,he was started onshort course ofempiric antibiotic therapy with cefepimeand vancomycin for 7 days. He continued to have persistent  encephalopathy.  July 75 he had progressive worsening mental status, and was emergently intubated and placed on mechanical ventilation.  He was placed on pressure support ventilation, with good toleration but he was unable to protect his airway due to his persistent encephalopathy. Further work-up with electroencephalography wasnegative for seizures.  Patient underwent percutaneous tracheostomy on July24.Patient had a persistent leukocytosis and significant sputum production. He was diagnosed with Klebsiella pneumonia on July 25th and placed on intravenous ceftriaxone.  Patient continue to failed spontaneous breathing trials.  07/19/20:  Responds to painful stimuli.  Does not follow commands.  Serum glucose 99 this morning.  Adjusting insulin.   Assessment/Plan: Principal Problem:   Subarachnoid hemorrhage following injury ([place]) Active Problems:   Encephalopathy acute   Tachypnea   Traumatic hemorrhage of cerebrum with loss of consciousness of 30 minutes or less (HCC)   Acute respiratory failure with hypoxia (HCC)   Status post tracheostomy (HCC)   Pressure injury of skin  1. Traumatic left skull fracture with bilateral subarachnoid and subdural hematoma. Notresponsive or interactive.Noclinical signs of active seizures.  He is not able to tolerate SBT due to rapid increase work of breathing. He has been maintained on mandatory mode of mechanical ventilation.   Trach care and aspiration precautions. Patient with very poor prognosis.   On keppra per tube.  2. Acute hypoxic respiratory failure due to ventilator associated pneumonia, complicated with sepsis not present on admission. Personally reviewed chest film from 07.23 with positive infiltrate at the right lower lobe.Sputum culture positive for klebsiella resistant to ampicillin and sulbactam.  Fi02 is 30% with oxygen saturation 100 %.  PRVC Fi02 30%, Peep 5, RR 16, Vt 630 cc.  Completed antibiotic therapy.    Patient with ventilator dependent respiratory failure. Poor prognosis.   3. Temporal bone fracture. Continue supportive medical therapy, eventually  will need outpatient follow up with ENT.  Seen by neurosurgery.  4. Uncontrolled T2DM Hg A1c 8,5.  Serum glucose today 99.  Cut down Lantus 15 units twice daily, cut down NovoLog 4 units every 4 hours Sensitive insulin sliding scale.  5. Alcohol intoxication. Noclinical signs of withdrawal.   6. Resolved Hypernatremia/ hypokalemia. Sodium trending down, today at 145> 142 with K at 4.2> 4.3 and serum bicarbonate at 26,   Free water flushes per peg tube at250 cc q 3 H.Follow renal panel in am.   7. HTN.continue withmetoprolol and amlodipinefor blood pressure control    Status is: Inpatient  Remains inpatient appropriate because:Inpatient level of care appropriate due to severity of illness   Dispo: The patient is from: Home  Anticipated d/c is to: LTAC  Anticipated d/c date is: 07/20/20 Pending placement.  Patient currently is not medically stable to d/c.   DVT prophylaxis:      scd   Code Status:              full  Family Communication:       No family at the bedside      Nutrition Status: Nutrition Problem: Inadequate oral intake Etiology: inability to eat Signs/Symptoms: NPO status Interventions: MVI, Prostat, Tube feeding     Skin Documentation:   Consultants:   ENT   Neurosurgery   Procedures:   Trach   Peg            Objective: Vitals:   07/19/20 1400 07/19/20 1545 07/19/20 1546 07/19/20 1600  BP: 118/64 113/66  106/62  Pulse: 85 91  88  Resp: '20 19  19  ' Temp:    98.1 F (36.7 C)  TempSrc:    Axillary  SpO2: 99% 98% 98% 98%  Weight:      Height:        Intake/Output Summary (Last 24 hours) at 07/19/2020 1805 Last data filed at 07/19/2020 1600 Gross per 24 hour  Intake 2780.01 ml  Output 1702 ml  Net 1078.01 ml    Filed Weights   07/16/20 0500 07/17/20 0433 07/18/20 0349  Weight: 71.3 kg 72.5 kg 72.5 kg    Exam:  . General: 74 y.o. year-old male well-developed well-nourished no acute stress.  Eyes open. Responds to painful stimuli.  Does not follow commands. . Cardiovascular: Tachycardic no rubs or gallops. Marland Kitchen Respiratory: Clear to auscultation no wheezes no rales.   . Abdomen: Soft nontender normal bowel sounds present.  PEG tube in place.  . Musculoskeletal: Trace lower extremity edema bilaterally.   Marland Kitchen Psychiatry: Unable to assess monitor altered mental status.  Data Reviewed: CBC: Recent Labs  Lab 07/16/20 0433  WBC 9.5  NEUTROABS 7.0  HGB 8.9*  HCT 29.1*  MCV 89.0  PLT 235   Basic Metabolic Panel: Recent Labs  Lab 07/14/20 0407 07/16/20 0433 07/17/20 0453 07/18/20 0419 07/19/20 0632  NA 149* 147* 145 142 142  K 3.7 3.2* 4.2 4.3 4.3  CL 110 106 108 109 106  CO2 '25 29 26 24 26  ' GLUCOSE 186* 168* 118* 108* 99  BUN 47* 35* 33* 30* 26*  CREATININE 0.93 0.71 0.71 0.59* 0.56*  CALCIUM 10.1 10.1 10.0 9.5 9.2   GFR: Estimated Creatinine Clearance: 81.8 mL/min (A) (by C-G formula based on SCr of 0.56 mg/dL (L)). Liver Function Tests: No results for input(s): AST, ALT, ALKPHOS, BILITOT, PROT, ALBUMIN in the last 168 hours. No results for input(s): LIPASE, AMYLASE in the last 168 hours. No  results for input(s): AMMONIA in the last 168 hours. Coagulation Profile: No results for input(s): INR, PROTIME in the last 168 hours. Cardiac Enzymes: No results for input(s): CKTOTAL, CKMB, CKMBINDEX, TROPONINI in the last 168 hours. BNP (last 3 results) No results for input(s): PROBNP in the last 8760 hours. HbA1C: No results for input(s): HGBA1C in the last 72 hours. CBG: Recent Labs  Lab 07/18/20 2325 07/19/20 0321 07/19/20 0740 07/19/20 1112 07/19/20 1516  GLUCAP 108* 107* 104* 97 92   Lipid Profile: No results for input(s): CHOL, HDL, LDLCALC, TRIG, CHOLHDL, LDLDIRECT in  the last 72 hours. Thyroid Function Tests: No results for input(s): TSH, T4TOTAL, FREET4, T3FREE, THYROIDAB in the last 72 hours. Anemia Panel: No results for input(s): VITAMINB12, FOLATE, FERRITIN, TIBC, IRON, RETICCTPCT in the last 72 hours. Urine analysis:    Component Value Date/Time   COLORURINE YELLOW 06/30/2020 1650   APPEARANCEUR CLEAR 06/30/2020 1650   LABSPEC 1.018 06/30/2020 1650   PHURINE 5.0 06/30/2020 1650   GLUCOSEU >=500 (A) 06/30/2020 1650   HGBUR SMALL (A) 06/30/2020 1650   BILIRUBINUR NEGATIVE 06/30/2020 1650   KETONESUR 5 (A) 06/30/2020 1650   PROTEINUR NEGATIVE 06/30/2020 1650   NITRITE NEGATIVE 06/30/2020 1650   LEUKOCYTESUR NEGATIVE 06/30/2020 1650   Sepsis Labs: '@LABRCNTIP' (procalcitonin:4,lacticidven:4)  ) No results found for this or any previous visit (from the past 240 hour(s)).    Studies: No results found.  Scheduled Meds: . amLODipine  10 mg Per Tube Daily  . chlorhexidine gluconate (MEDLINE KIT)  15 mL Mouth Rinse BID  . Chlorhexidine Gluconate Cloth  6 each Topical Q0600  . docusate  100 mg Per Tube BID  . feeding supplement (PROSource TF)  90 mL Per Tube BID  . free water  250 mL Per Tube Q6H  . heparin  5,000 Units Subcutaneous Q8H  . insulin aspart  0-20 Units Subcutaneous Q4H  . insulin aspart  8 Units Subcutaneous Q4H  . insulin detemir  15 Units Subcutaneous BID  . levETIRAcetam  1,000 mg Per Tube BID  . mouth rinse  15 mL Mouth Rinse 10 times per day  . metoprolol tartrate  50 mg Per Tube BID  . multivitamin with minerals  1 tablet Per Tube Daily  . pantoprazole sodium  40 mg Per Tube QHS  . polyethylene glycol  17 g Per Tube BID  . Semaglutide(0.25 or 0.5MG/DOS)  0.5067 mg Subcutaneous Q Wed  . sennosides  5 mL Per Tube BID    Continuous Infusions: . sodium chloride 10 mL/hr at 07/19/20 1400  . feeding supplement (GLUCERNA 1.2 CAL) 1,000 mL (07/19/20 1620)     LOS: 20 days     Kayleen Memos, MD [facility] Pager [phone]  If 7PM-7AM, please contact night-coverage [email or web address] Password [facility] 07/19/2020, 6:05 PM

## 2020-07-19 NOTE — Progress Notes (Signed)
   Providing Compassionate, Quality Care - Together  NEUROSURGERY PROGRESS NOTE   S: No issues overnight.   O: EXAM:  BP 121/70   Pulse 91   Temp 98.6 F (37 C) (Axillary)   Resp (!) 32   Ht 6\' 1"  (1.854 m)   Wt 72.5 kg   SpO2 100%   BMI 21.09 kg/m   Eyes open spont. PERRL Trach in place Min movement in BUE WD to pain in BLE [place] to pain Appears to track to the left and slightly toward the right  ASSESSMENT:  74 y.o. male with  1. Left occipital parietotemporal calvarial fracture secondary to fall 2.Left temporal contusion 3.Bilateral frontal traumatic subarachnoidhemorrhage & contusions/subdural hematomaswith cerebral edema 4.TBI, severe 5. HCAP  PLAN: -neuro ICU -Q4hr neurochecks -Keppra 1000 mg twice daily(EEG negx2) -s/p PEG -Blood pressure control -ok for SQH -CT brain stable 7/20 -s/p trach -LTAC planning/vent SNF -abx for PNA -palliative on board, family meeting pending    Please do not hesitate to call with questions or concerns.   8/20, DO Neurosurgeon [facility] Cell: [phone]

## 2020-07-19 NOTE — TOC Progression Note (Addendum)
Transition of Care [facility]) - Progression Note    Patient Details  Name: Harold Mckee MRN: [id number] Date of Birth: 04-Nov-1946  [facility] of [facility] CM/SW Contact  Nonda Lou, [place] Phone Number: 07/19/2020, 3:34 PM  Clinical Narrative:    3:41p CSW received follow-up call from patient's daughter Arlina Robes. Nichole stated the family is meeting with palliative tomorrow to discuss goals of care. Nichole was agreeable to follow-up after the GOC meeting.  3:34p CSW attempted to contact patient's daughter Arlina Robes to follow-up on LTAC choice. CSW left a voicemail, awaiting a call back.    Expected Discharge Plan: Long Term Acute Care (LTAC) Barriers to Discharge: Continued Medical Work up  Expected Discharge Plan and Services Expected Discharge Plan: Long Term Acute Care (LTAC)   Discharge Planning Services: CM Consult   Living arrangements for the past 2 months: Single Family Home                                       Social Determinants of Health (SDOH) Interventions    Readmission Risk Interventions No flowsheet data found.

## 2020-07-20 DIAGNOSIS — Z515 Encounter for palliative care: Secondary | ICD-10-CM

## 2020-07-20 DIAGNOSIS — Z7189 Other specified counseling: Secondary | ICD-10-CM

## 2020-07-20 LAB — GLUCOSE, CAPILLARY
Glucose-Capillary: 139 mg/dL — ABNORMAL HIGH (ref 70–99)
Glucose-Capillary: 139 mg/dL — ABNORMAL HIGH (ref 70–99)
Glucose-Capillary: 102 mg/dL — ABNORMAL HIGH (ref 70–99)
Glucose-Capillary: 102 mg/dL — ABNORMAL HIGH (ref 70–99)
Glucose-Capillary: 158 mg/dL — ABNORMAL HIGH (ref 70–99)
Glucose-Capillary: 140 mg/dL — ABNORMAL HIGH (ref 70–99)
Glucose-Capillary: 140 mg/dL — ABNORMAL HIGH (ref 70–99)
Glucose-Capillary: 142 mg/dL — ABNORMAL HIGH (ref 70–99)

## 2020-07-20 MED ORDER — MORPHINE SULFATE (PF) 2 MG/ML IV SOLN
2.0000 mg | INTRAVENOUS | Status: DC | PRN
  Administered 2020-07-20 – 2020-07-21 (×2): 2 mg via INTRAVENOUS
  Filled 2020-07-20 (×2): qty 1

## 2020-07-20 NOTE — Progress Notes (Signed)
PROGRESS NOTE  Harold Mckee ZHY:[id number] DOB: [birth year] DOA: 06/29/2020 PCP: Patient, No Pcp Per  HPI/Recap of past 24 hours: Patient was admitted to the hospital with a working diagnosis of bilateral subarachnoid/subdural hemorrhage secondary to left skull fracture due to mechanical fall. Hospitalization complicated by sepsis due to Klebsiella pneumonia, endorgan failure acute hypoxic respiratory failure (not present on admission).  74 year old male who presented after mechanical fall. He does have significant past medical history for hypertension and type 2 diabetes mellitus. He fell from ground-level resulting in head injury while walking his dog in the park. Apparently patient had been drinking alcohol. On his initial physical examination he was bleeding from his left ear, his vital signs showed a blood pressure 134/76, heart rate 94, temperature 97.4, respiratory 22, oxygen saturation 98%. He was altered, unable to follow commands, his lungs are clear to auscultation bilaterally, heart S1-S2, present rhythmic, his abdomen was soft, no lower extremity edema.  CT head and spine showed nondisplaced fracture of the left parietal calvarium along the leftlambdoid suture extending into the left occipital calvarium and left temporal bone with involvement of the left mastoid air cell and external auditory canal. Bilateral subdural and subarachnoid hemorrhages. No midline shift. Focal hemorrhage with small pockets of pneumocephalus in the left posterior temporal lobe along the calvarial fracture.  Patient was admitted to the neuro intensive care unit, he had frequent neuro checks and received.  Patient was evaluated by ENT, no intervention recommended at this point.  On July 14 patient developed fever and worsening leukocytosis, lactic 5.1,he was started onshort course ofempiric antibiotic therapy with cefepimeand vancomycin for 7 days. He continued to have persistent  encephalopathy.  July 15 he had progressive worsening mental status, and was emergently intubated and placed on mechanical ventilation.  He was placed on pressure support ventilation, with good toleration but he was unable to protect his airway due to his persistent encephalopathy. Further work-up with electroencephalography wasnegative for seizures.  Patient underwent percutaneous tracheostomy on July24.Patient had a persistent leukocytosis and significant sputum production. He was diagnosed with Klebsiella pneumonia on July 25th and placed on intravenous ceftriaxone.  Patient continue to failed spontaneous breathing trials.  07/20/20:  Responds to painful stimuli.  Does not follow commands.  PMT had goals of care discussion with family and plan is to transition to comfort care ion Monday to give time for other family members.  No escalation of treatment moving forward til then per family request.  Assessment/Plan: Principal Problem:   Subarachnoid hemorrhage following injury ([place]) Active Problems:   Encephalopathy acute   Tachypnea   Traumatic hemorrhage of cerebrum with loss of consciousness of 30 minutes or less ([place])   Acute respiratory failure with hypoxia ([place])   Status post tracheostomy ([place])   Pressure injury of skin  1. Traumatic left skull fracture with bilateral subarachnoid and subdural hematoma. Notresponsive or interactive.Noclinical signs of active seizures.  He is not able to tolerate SBT due to rapid increase work of breathing. He has been maintained on mandatory mode of mechanical ventilation.   Trach care and aspiration precautions. Patient with very poor prognosis.   On keppra per tube.  2. Acute hypoxic respiratory failure due to ventilator associated pneumonia, complicated with sepsis not present on admission. Personally reviewed chest film from 07.23 with positive infiltrate at the right lower lobe.Sputum culture positive for klebsiella resistant  to ampicillin and sulbactam.  Fi02 is 30% with oxygen saturation 100 %.  PRVC Fi02 30%, Peep 5, RR 16,  Vt 630 cc.  Completed antibiotic therapy.   Patient with ventilator dependent respiratory failure. Poor prognosis.   3. Temporal bone fracture. Continue supportive medical therapy, eventually will need outpatient follow up with ENT.  Seen by neurosurgery.  4. Uncontrolled T2DM Hg A1c 8,5.  Serum glucose today 99.  Cut down Lantus 15 units twice daily, cut down NovoLog 4 units every 4 hours Sensitive insulin sliding scale.  5. Alcohol intoxication. Noclinical signs of withdrawal.   6. Resolved Hypernatremia/ hypokalemia. Sodium trending down, today at 145> 142 with K at 4.2> 4.3 and serum bicarbonate at 26,   Free water flushes per peg tube at250 cc q 3 H.Follow renal panel in am.   7. HTN.continue withmetoprolol and amlodipinefor blood pressure control    Status is: Inpatient  Remains inpatient appropriate because:Inpatient level of care appropriate due to severity of illness   Dispo: The patient is from: Home  Anticipated d/c is to: LTAC  Anticipated d/c date is: 07/23/20 Pending placement.  Patient currently is not medically stable to d/c.   DVT prophylaxis:      scd   Code Status:              full  Family Communication:       No family at the bedside      Nutrition Status: Nutrition Problem: Inadequate oral intake Etiology: inability to eat Signs/Symptoms: NPO status Interventions: MVI, Prostat, Tube feeding     Skin Documentation:   Consultants:   ENT   Neurosurgery   Procedures:   Trach   Peg            Objective: Vitals:   07/20/20 1000 07/20/20 1100 07/20/20 1136 07/20/20 1200  BP: 116/70 121/65 121/65 121/66  Pulse: 90 85 88 84  Resp: (!) 26 (!) 23 (!) 24 (!) 25  Temp:    98.7 F (37.1 C)  TempSrc:    Axillary  SpO2: 100% 100% 100% 100%  Weight:        Height:        Intake/Output Summary (Last 24 hours) at 07/20/2020 1339 Last data filed at 07/20/2020 1200 Gross per 24 hour  Intake 2766.34 ml  Output 2300 ml  Net 466.34 ml   Filed Weights   07/17/20 0433 07/18/20 0349 07/20/20 0500  Weight: 72.5 kg 72.5 kg 73 kg    Exam: No changes from previous exam.  . General: 74 y.o. year-old male well-developed well-nourished no acute stress.  Eyes open. Responds to painful stimuli.  Does not follow commands. . Cardiovascular: Tachycardic no rubs or gallops. Marland Kitchen Respiratory: Clear to auscultation no wheezes no rales.   . Abdomen: Soft nontender normal bowel sounds present.  PEG tube in place.  . Musculoskeletal: Trace lower extremity edema bilaterally.   Marland Kitchen Psychiatry: Unable to assess monitor altered mental status.  Data Reviewed: CBC: Recent Labs  Lab 07/16/20 0433  WBC 9.5  NEUTROABS 7.0  HGB 8.9*  HCT 29.1*  MCV 89.0  PLT 161   Basic Metabolic Panel: Recent Labs  Lab 07/14/20 0407 07/16/20 0433 07/17/20 0453 07/18/20 0419 07/19/20 0632  NA 149* 147* 145 142 142  K 3.7 3.2* 4.2 4.3 4.3  CL 110 106 108 109 106  CO2 _0 GLUCOSE 186* 168* 118* 108* 99  BUN 47* 35* 33* 30* 26*  CREATININE 0.93 0.71 0.71 0.59* 0.56*  CALCIUM 10.1 10.1 10.0 9.5 9.2   GFR: Estimated Creatinine Clearance: 82.4 mL/min (A) (by C-G  formula based on SCr of 0.56 mg/dL (L)). Liver Function Tests: No results for input(s): AST, ALT, ALKPHOS, BILITOT, PROT, ALBUMIN in the last 168 hours. No results for input(s): LIPASE, AMYLASE in the last 168 hours. No results for input(s): AMMONIA in the last 168 hours. Coagulation Profile: No results for input(s): INR, PROTIME in the last 168 hours. Cardiac Enzymes: No results for input(s): CKTOTAL, CKMB, CKMBINDEX, TROPONINI in the last 168 hours. BNP (last 3 results) No results for input(s): PROBNP in the last 8760 hours. HbA1C: No results for input(s): HGBA1C in the last 72 hours. CBG: Recent  Labs  Lab 07/19/20 1921 07/19/20 2331 07/20/20 0313 07/20/20 0724 07/20/20 1135  GLUCAP 148* 121* 139*  139* 102*  102* 158*   Lipid Profile: No results for input(s): CHOL, HDL, LDLCALC, TRIG, CHOLHDL, LDLDIRECT in the last 72 hours. Thyroid Function Tests: No results for input(s): TSH, T4TOTAL, FREET4, T3FREE, THYROIDAB in the last 72 hours. Anemia Panel: No results for input(s): VITAMINB12, FOLATE, FERRITIN, TIBC, IRON, RETICCTPCT in the last 72 hours. Urine analysis:    Component Value Date/Time   COLORURINE YELLOW 06/30/2020 1650   APPEARANCEUR CLEAR 06/30/2020 1650   LABSPEC 1.018 06/30/2020 1650   PHURINE 5.0 06/30/2020 1650   GLUCOSEU >=500 (A) 06/30/2020 1650   HGBUR SMALL (A) 06/30/2020 1650   BILIRUBINUR NEGATIVE 06/30/2020 1650   KETONESUR 5 (A) 06/30/2020 1650   PROTEINUR NEGATIVE 06/30/2020 1650   NITRITE NEGATIVE 06/30/2020 1650   LEUKOCYTESUR NEGATIVE 06/30/2020 1650   Sepsis Labs: _0 (procalcitonin:4,lacticidven:4)  ) No results found for this or any previous visit (from the past 240 hour(s)).    Studies: No results found.  Scheduled Meds: . amLODipine  10 mg Per Tube Daily  . chlorhexidine gluconate (MEDLINE KIT)  15 mL Mouth Rinse BID  . Chlorhexidine Gluconate Cloth  6 each Topical Q0600  . docusate  100 mg Per Tube BID  . feeding supplement (PROSource TF)  90 mL Per Tube BID  . free water  250 mL Per Tube Q6H  . heparin  5,000 Units Subcutaneous Q8H  . insulin aspart  0-9 Units Subcutaneous Q4H  . insulin aspart  4 Units Subcutaneous Q4H  . insulin detemir  15 Units Subcutaneous BID  . levETIRAcetam  1,000 mg Per Tube BID  . mouth rinse  15 mL Mouth Rinse 10 times per day  . metoprolol tartrate  50 mg Per Tube BID  . multivitamin with minerals  1 tablet Per Tube Daily  . pantoprazole sodium  40 mg Per Tube QHS  . polyethylene glycol  17 g Per Tube BID  . Semaglutide(0.25 or 0.5MG/DOS)  0.5067 mg Subcutaneous Q Wed  . sennosides  5  mL Per Tube BID    Continuous Infusions: . sodium chloride Stopped (07/19/20 1448)  . feeding supplement (GLUCERNA 1.2 CAL) 1,000 mL (07/20/20 0754)     LOS: 21 days     Kayleen Memos, MD [facility] Pager [phone]  If 7PM-7AM, please contact night-coverage [email or web address] Password [email or web address] 07/20/2020, 1:39 PM

## 2020-07-20 NOTE — Progress Notes (Signed)
Likely hospital death with removal of life support requested by family on Monday 07/26/20

## 2020-07-20 NOTE — Progress Notes (Signed)
PT Cancellation Note  Patient Details Name: Harold Mckee MRN: [id number] DOB: 1946/08/27   Cancelled Treatment:    Reason Eval/Treat Not Completed: Other (comment) - RN requests hold until palliative meeting, PT to check back as schedule allows.  Richrd Sox, PT Acute Rehabilitation Services Pager [phone]  Office [phone]    [name] 07/20/2020, 10:06 AM

## 2020-07-20 NOTE — Progress Notes (Signed)
CDS Referral [phone]. Spoke with Joanne Chars. Call with Cardiac time of death or formal brain death testing. Vanda Waskey, [place]rape, RN

## 2020-07-20 NOTE — Consult Note (Addendum)
Consultation Note Date: 07/20/2020   Patient Name: Harold Mckee  DOB: [birth year]  MRN: [id number]  Age / Sex: 74 y.o., male  PCP: Patient, No Pcp Per Referring Physician: Kayleen Memos, DO  Reason for Consultation: Establishing goals of care  HPI/Patient Profile: 74 y.o. male  with past medical history of hypertension and diabetes [facility] Department on 06/29/2020 after a fall while walking his dog. The fall was witnessed, he fell back and hit his head, afterward  had altered mental status so family called EMS. In the ED, labs did reveal positive ETOH. He was noted to have blood in the left ear canal. CT head revealed fracture of the left parietal calvarium, and bilateral subdural and subarachnoid hemorrhages.  His mental status declined to the point of extreme somnolence with questionable airway protection and he required emergent intubation 7/15. Hospitalization has been complicated by sepsis secondary to klebsiella pneumonia.   He is status post tracheostomy and PEG placement. He has remained poorly responsive. Palliative care has been consulted to assist with goals of care.  Clinical Assessment and Goals of Care:  Family Discussion: 07/20/20   Participants: Marcellino Fidalgo (wife), Emarion Toral (daughter), Lindley Hiney (son), Tailor Westfall (son), Kristi Hyer (daughter in law), and 2 adult grandchildren  I met with patient's family in the 4N conference room to discuss diagnosis, prognosis, GOC, disposition, and options.  I introduced Palliative Medicine as specialized medical care for people living with serious illness. It focuses on quality of life and providing support to patient and family during a serious illness.   We discussed a brief life review of the patient. Lestat was born in [place], [place] and relocated to [place] with his parents when he was young. 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 Jeneen Rinks and Rodena Piety have been married for 50 years, they have 3 children together ([place], [place]). Rodger loved music, playing cards, walking his dog, and spending time with friends and family.   I asked the patients family what they knew and understood about patient's present health situation. Some family members expressed a limited understanding of his diagnosis. A formal description of his injuries, diagnosis, and prognosis was reviewed. 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 Discussed that he has remained minimally responsive.    Discussed at length the disposition option of LTAC. We discussed that he would likely be ventilator dependent long-term. Discussed that there would be complications with this type of care including infections and skin break down. Family expresses this would not be good quality of life for [place]. Rodena Piety states "he would not want to live like that".   Discussed the option of stopping ventilatory support. Discussed that by removing ventilatory support, we would be allowing for a natural death to occur that would have happened earlier if he had not been intubated. Discussed that medication would be administered to ensure comfort during this process.   After further questions and discussion, family  collectively verbalize to me that they want to stop ventilatory support. They request time to allow additional family (patient's sisters) to visit.   I addressed the issue of code status with family. I emphasized that if the patient's heart stopped that aggressive resuscitation efforts would likely not be successful and would cause additional pain and suffering.  I expressed a strong recommendation to family for having a DNR order in place, to which they agree.   Plan for additional decisions related to transition of care focus to ensue in the oncoming days.   Family was provided  with my contact information and encouraged to call with questions or concerns.   Primary decision maker: wife Rodena Piety with support from her children   SUMMARY OF RECOMMENDATIONS   - code status changed to DNR - no further escalation of care - discontinue labs - morphine 2 mg every 3 hours as needed for pain - plan for removal of ventilatory support Monday 8/9 per family request  Code Status/Advance Care Planning:  DNR  Palliative Prophylaxis:   Bowel Regimen, Oral Care and Turn Reposition  Additional Recommendations (Limitations, Scope, Preferences):  No Lab Draws  Prognosis:   Likely hours after removal from ventilator  Discharge Planning: likely [facility]      Primary Diagnoses: Present on Admission: . Subarachnoid hemorrhage following injury ([place])   I have reviewed the medical record, interviewed the patient and family, and examined the patient. The following aspects are pertinent.  Past Medical History:  Diagnosis Date  . Diabetes mellitus without complication ([place])   . Hypertension     No family history on file. Scheduled Meds: . amLODipine  10 mg Per Tube Daily  . chlorhexidine gluconate (MEDLINE KIT)  15 mL Mouth Rinse BID  . Chlorhexidine Gluconate Cloth  6 each Topical Q0600  . docusate  100 mg Per Tube BID  . feeding supplement (PROSource TF)  90 mL Per Tube BID  . free water  250 mL Per Tube Q6H  . heparin  5,000 Units Subcutaneous Q8H  . insulin aspart  0-9 Units Subcutaneous Q4H  . insulin aspart  4 Units Subcutaneous Q4H  . insulin detemir  15 Units Subcutaneous BID  . levETIRAcetam  1,000 mg Per Tube BID  . mouth rinse  15 mL Mouth Rinse 10 times per day  . metoprolol tartrate  50 mg Per Tube BID  . multivitamin with minerals  1 tablet Per Tube Daily  . pantoprazole sodium  40 mg Per Tube QHS  . polyethylene glycol  17 g Per Tube BID  . Semaglutide(0.25 or 0.5MG/DOS)  0.5067 mg Subcutaneous Q Wed  . sennosides  5 mL Per Tube BID    Continuous Infusions: . sodium chloride Stopped (07/19/20 1448)  . feeding supplement (GLUCERNA 1.2 CAL) 1,000 mL (07/20/20 0754)   PRN Meds:.sodium chloride, acetaminophen, acetaminophen, bisacodyl, docusate, morphine injection, ondansetron **OR** ondansetron (ZOFRAN) IV, polyethylene glycol  No Known Allergies  Physical Exam Vitals reviewed.  Constitutional:      General: He is not in acute distress. Pulmonary:     Comments: Tracheostomy, ventilator Abdominal:     Comments: Tube feeding via PEG  Neurological:     Comments: Does not follow commands     Vital Signs: BP 121/66   Pulse 84   Temp 98.7 F (37.1 C) (Axillary)   Resp (!) 25   Ht _0  (1.854 m)   Wt 73 kg   SpO2 100%   BMI 21.23 kg/m  Pain Scale:  CPOT   SpO2: SpO2: 100 % O2 Device:SpO2: 100 %  I LBM: Last BM Date: 07/19/20 Baseline Weight: Weight: 72.4 kg Most recent weight: Weight: 73 kg      Palliative Assessment/Data: 10%     Time In: 9:20 Time Out: 11:00 Time Total: 100 minutes Greater than 50%  of this time was spent counseling and coordinating care related to the above assessment and plan.  Signed by: Lavena Bullion, NP   Please contact Palliative Medicine Team phone at [phone] for questions and concerns.  For individual provider: See Shea Evans

## 2020-07-21 DIAGNOSIS — Z515 Encounter for palliative care: Secondary | ICD-10-CM

## 2020-07-21 DIAGNOSIS — Z7189 Other specified counseling: Secondary | ICD-10-CM

## 2020-07-21 DIAGNOSIS — S06361A Traumatic hemorrhage of cerebrum, unspecified, with loss of consciousness of 30 minutes or less, initial encounter: Secondary | ICD-10-CM

## 2020-07-21 LAB — GLUCOSE, CAPILLARY
Glucose-Capillary: 125 mg/dL — ABNORMAL HIGH (ref 70–99)
Glucose-Capillary: 129 mg/dL — ABNORMAL HIGH (ref 70–99)
Glucose-Capillary: 129 mg/dL — ABNORMAL HIGH (ref 70–99)
Glucose-Capillary: 151 mg/dL — ABNORMAL HIGH (ref 70–99)
Glucose-Capillary: 166 mg/dL — ABNORMAL HIGH (ref 70–99)
Glucose-Capillary: 169 mg/dL — ABNORMAL HIGH (ref 70–99)

## 2020-07-21 NOTE — Progress Notes (Signed)
SLP Cancellation Note  Patient Details Name: Harold Mckee MRN: [id number] DOB: 1946-05-16   Cancelled treatment:       Reason Eval/Treat Not Completed: Other (comment). Will d/c all SLP orders given plan to withdraw from support.    Mikaiya Tramble, Riley Nearing 07/21/2020, 7:56 AM

## 2020-07-21 NOTE — Progress Notes (Signed)
   Providing Compassionate, Quality Care - Together  NEUROSURGERY PROGRESS NOTE   S: No issues overnight. Family decided to withdrawal care Monday  O: EXAM:  BP 102/83   Pulse 86   Temp 98 F (36.7 C) (Oral)   Resp 18   Ht 6\' 1"  (1.854 m)   Wt 73 kg   SpO2 100%   BMI 21.23 kg/m   Eyes [place]. PERRL Trach in place Min movement in BUE WD to pain in BLE [place] to pain Appears to track to the leftand slightly toward the right   ASSESSMENT:  74 y.o. male with  1. Left occipital parietotemporal calvarial fracture secondary to fall 2.Left temporal contusion 3.Bilateral frontal traumatic subarachnoidhemorrhage & contusions/subdural hematomaswith cerebral edema 4.TBI, severe 5. HCAP  PLAN: -neuro ICU -Q4hr neurochecks -Keppra 1000 mg twice daily(EEG negx2) -s/p PEG -Blood pressure control -ok for SQH -CT brain stable 7/20 -s/p trach -abx for PNA -palliative on board, planned withdrawal on monday    Thank you for allowing me to participate in this patient's care.  Please do not hesitate to call with questions or concerns.   Thursday, DO Neurosurgeon [facility] Cell: [phone]

## 2020-07-21 NOTE — Progress Notes (Signed)
NAME:  Harold Mckee, MRN:  [id number], DOB:  [birth year], LOS: 22 ADMISSION DATE:  06/29/2020, CONSULTATION DATE:  06/29/2020 REFERRING MD:  Dr. Jake Samples, CHIEF COMPLAINT:  Bilateral subarachnoid/subdural hematomas   Brief History   74 y o male with HTN and Diabetes who presented after ground level fall resulting in head injury while [place]. Per EMS patient was seen drinking alcohol prior to fall.   On arrival patient was seen bleeding from his left ear. Vitals on arrival significant for mild tachypnea and hypertension. Labwork significant for hyperglycemia and mild leukocytosis. CT C-spin and head positive for  left scull fracture and bilateral subarachnoid/subdural hematomas. PCCM consulted for assistance ventilator managment  Past Medical History  HTN  Diabetes   Significant Hospital Events   Admitted 7/13  7.15 > Started on empiric cefepime 7/14 due to fever and leukocytosis.  PCT 1.06 and lactate 5.9.  7/25 > sputum culture positive for few Klebsiella pneumonia, IV ceftriaxone started  Consults:  Neurosurgery   ENT 7/15 - Dr Suszanne Conners > Left longitudinal temporal bone fracture with possible separation of the malleus and incus - Cotton ball packing of the left ear canal. Change cotton ball as needed.  The T-bone CT reviewed. No obvious otic capsule involvement.   No acute ENT intervention needed at this time. - The patient will need hearing test as an outpatient in my office. My need middle ear exploration if significant conductive hearing loss is noted.   Procedures:  7/23 tracheostomy 7/25 PEG tube placement  Significant Diagnostic Tests:  CT C-spine and head 7/13 > Nondisplaced fracture of the left parietal calvarium along the left lambdoid suture extending into the left occipital calvarium and left temporal bone with involvement of the left mastoid air cell and external auditory canal. Dedicated temporal bone CT is recommended for further evaluation. Bilateral  subdural and subarachnoid hemorrhages. No midline shift. 3. Focal hemorrhage with small pockets of pneumocephalus in the left posterior temporal lobe along the calvarial fracture. This likely represents combination of subarachnoid and intraparenchymal hemorrhage. 4. No acute/traumatic cervical spine pathology.  CT head 7/14 > blooming contusions in bilateral inferior frontal, bilateral, inferior temporal, and left parieto-occipital lobes.  Generalized increased in SAH, mild IVH without hydrocephalus.  Progressive scalp swelling over left calvarial fracture.  No pneumocephalus.   CT temporal bones 7/14 > longitudinal left temporal bone fx.  Hemotympanum  On left with pneuocephalus and soft tissue emphysema.  EEG 7/14 > mod diffuse encephalopathy.  No seizures or epileptiform activity noted. Head CT 17 >> no change, extensive hemorrhagic contusions in the frontal lobes bilaterally, bifrontal subdural hematoma, diffuse subarachnoid hemorrhage, IVH without hydrocephalus  EEG 7/19 >> Moderate to severe encephalopathy, nonspecific.  No seizures or epileptiform discharges seen  Micro Data:  COVID 7/13 > negative  Blood 7/14 > negative Respiratory 7/16 >> normal flora  7/22 >> Blood 7/16 >> negative Sputum culture 7/22 > few Klebsiella pneumoniae,   Antimicrobials:  Cefepime 7/13 > 7/20 Vanco 7/16 >>  7/20   Interim history/subjective:  RN reports no acute events or trach/vent related events.   Objective   Blood pressure 126/71, pulse (!) 104, temperature 99 F (37.2 C), temperature source Axillary, resp. rate 19, height 6\' 1"  (1.854 m), weight 73 kg, SpO2 98 %.    Vent Mode: PRVC FiO2 (%):  [30 %] 30 % Set Rate:  [16 bmp] 16 bmp Vt Set:  [630 mL] 630 mL PEEP:  [5 cmH20] 5 cmH20 Pressure  Support:  [15 cmH20] 15 cmH20 Plateau Pressure:  [16 cmH20-19 cmH20] 16 cmH20   Intake/Output Summary (Last 24 hours) at 07/21/2020 0807 Last data filed at 07/21/2020 0800 Gross per 24 hour  Intake 2565  ml  Output 2000 ml  Net 565 ml   Filed Weights   07/18/20 0349 07/20/20 0500 07/21/20 0500  Weight: 72.5 kg 73 kg 73 kg    Examination: General:  Elderly male on vent via trach Neuro:  Eyes open spontaneously. Does not trach. Does not follow commands. No spontaneous movement.  HEENT:  [place]/AT, No JVD noted, PERRL. Trach in place #6 shiley cuff.  Cardiovascular:  RRR, no MRG Lungs:  Clear Abdomen:  Soft, non-distended Musculoskeletal:  No acute deformity Skin:  Intact, MMM  Resolved problems:  Sepsis due to HCAP  Assessment & Plan:   Acute hypoxic respiratory Failure, due to healthcare associated pneumonia with Klebsiella -In the setting of bilateral SAH/SDH -S/P percutaneous tracheostomy tube placement 7/24  Palliative care has been involved and the patient is now a DNR with plans for comfort care on 8/9 once all family has had an opportunity to visit.   P: Continue vent via trach No escalation of care No further weaning trials.  Consider use of "CCM withdrawal of life-sustaining treatment" orderset when appropriate to ensure a peaceful transition off vent.    Rest of acute and chronic medical conditions as follows to now be managed by primary team - Mechanical fall resulting in Left skull fracture and bilateral subarachnoid/subdural hemorrhage - Persistent acute traumatic encephalopathy - Left longitudinal temporal bone fracture with possible separation of the malleus and incus - Hypertension - Diabetes type 2, uncontrolled with hyperglycemia - Alcohol intoxication - Hypernatremia  PCCM will follow weekly if patient continues with vent support. Available PRN as well, of course.    LABS    PULMONARY No results for input(s): PHART, PCO2ART, PO2ART, HCO3, TCO2, O2SAT in the last 168 hours.  Invalid input(s): PCO2, PO2  CBC Recent Labs  Lab 07/16/20 0433  HGB 8.9*  HCT 29.1*  WBC 9.5  PLT 267    COAGULATION No results for input(s): INR in the last 168  hours.  CARDIAC  No results for input(s): TROPONINI in the last 168 hours. No results for input(s): PROBNP in the last 168 hours.   CHEMISTRY Recent Labs  Lab 07/16/20 0433 07/16/20 0433 07/17/20 0453 07/17/20 0453 07/18/20 0419 07/19/20 0632  NA 147*  --  145  --  142 142  K 3.2*   < > 4.2   < > 4.3 4.3  CL 106  --  108  --  109 106  CO2 29  --  26  --  24 26  GLUCOSE 168*  --  118*  --  108* 99  BUN 35*  --  33*  --  30* 26*  CREATININE 0.71  --  0.71  --  0.59* 0.56*  CALCIUM 10.1  --  10.0  --  9.5 9.2   < > = values in this interval not displayed.   Estimated Creatinine Clearance: 82.4 mL/min (A) (by C-G formula based on SCr of 0.56 mg/dL (L)).   LIVER No results for input(s): AST, ALT, ALKPHOS, BILITOT, PROT, ALBUMIN, INR in the last 168 hours.   INFECTIOUS No results for input(s): LATICACIDVEN, PROCALCITON in the last 168 hours.   ENDOCRINE CBG (last 3)  Recent Labs    07/20/20 2327 07/21/20 0326 07/21/20 0736  GLUCAP 142* 125* 129*  Joneen Roach, AGACNP-BC [facility]  See Amion for personal pager PCCM on call pager [phone]  07/21/2020 8:19 AM

## 2020-07-21 NOTE — Progress Notes (Signed)
° °                                                                                                                                                     °                                                   °Daily Progress Note  ° °Patient Name: Harold Mckee       Date: 07/21/2020 °DOB: 11/25/1946  Age: 74 y.o. MRN#: [id number] °Attending Physician: Hall, Carole N, DO °Primary Care Physician: Patient, No Pcp Per °Admit Date: 06/29/2020 ° °HPI/Patient Profile: 74 y.o. male  with past medical history of hypertension and diabetes brought to [facility] Emergency Department on 06/29/2020 after a fall while walking his dog. The fall was witnessed, he fell back and hit his head, afterward  had altered mental status so family called EMS. In the ED, labs did reveal positive ETOH. He was noted to have blood in the left ear canal. CT head revealed fracture of the left parietal calvarium, and bilateral subdural and subarachnoid hemorrhages.  °His mental status declined to the point of extreme somnolence with questionable airway protection and he required emergent intubation 7/15. Hospitalization has been complicated by sepsis secondary to klebsiella pneumonia.   °He is status post tracheostomy and PEG placement. He has remained poorly responsive. °Palliative care has been consulted to assist with goals of care. ° °Subjective: °Patient appears comfortable. He remains minimally responsive; does not track or follow commands.  ° °I spoke with daughter Harold Mckee by phone to provide a brief update and answer any questions from our meeting yesterday. She shares that the family is still processing the information from yesterday. She shares that Unknown was a favorite among the extended family, and that there are family members wanting to visit. Discussed the importance of allowing family the chance to say their goodbyes. Family that want to visit include patient's sisters, brother, and several nieces and nephews. Harold Mckee states these family members  plan to visit Sunday 8/8. Discussed that we can liberalize visitation on Sunday to accommodate this.  °Harold Mckee requests to proceed with removal from the ventilator the following day 8/9, with only immediate family present.  ° °Length of Stay: 22 ° °Current Medications: °Scheduled Meds:  °• amLODipine  10 mg Per Tube Daily  °• chlorhexidine gluconate (MEDLINE KIT)  15 mL Mouth Rinse BID  °• Chlorhexidine Gluconate Cloth  6 each Topical Q0600  °• docusate  100 mg Per Tube BID  °• feeding supplement (PROSource TF)  90 mL Per Tube BID  °• free water  250 mL Per Tube Q6H  °• heparin  5,000 Units Subcutaneous Q8H  °•   insulin aspart  0-9 Units Subcutaneous Q4H  °• insulin aspart  4 Units Subcutaneous Q4H  °• insulin detemir  15 Units Subcutaneous BID  °• levETIRAcetam  1,000 mg Per Tube BID  °• mouth rinse  15 mL Mouth Rinse 10 times per day  °• metoprolol tartrate  50 mg Per Tube BID  °• multivitamin with minerals  1 tablet Per Tube Daily  °• pantoprazole sodium  40 mg Per Tube QHS  °• polyethylene glycol  17 g Per Tube BID  °• Semaglutide(0.25 or 0.5MG/DOS)  0.5067 mg Subcutaneous Q Wed  °• sennosides  5 mL Per Tube BID  ° ° °Continuous Infusions: °• sodium chloride Stopped (07/19/20 1448)  °• feeding supplement (GLUCERNA 1.2 CAL) 1,000 mL (07/21/20 1402)  ° ° °PRN Meds: °sodium chloride, acetaminophen, acetaminophen, bisacodyl, docusate, morphine injection, ondansetron **OR** ondansetron (ZOFRAN) IV, polyethylene glycol ° °Physical Exam °Constitutional:   °   General: He is not in acute distress. °Pulmonary:  °   Comments: Tracheostomy °Ventilator  °Abdominal:  °   Comments: Tube feeding via PEG  °Neurological:  °   Comments: Does not track or follow commands  ° °         ° °Vital Signs: BP 123/68    Pulse 90    Temp 97.9 °F (36.6 °C) (Oral)    Resp 16    Ht 6' 1" (1.854 m)    Wt 73 kg    SpO2 99%    BMI 21.23 kg/m²  °SpO2: SpO2: 99 % °O2 Device: O2 Device: Ventilator ° °     °Palliative Assessment/Data:  10% ° ° ° ° ° °Patient Active Problem List  ° Diagnosis Date Noted  °• Palliative care by specialist   °• Goals of care, counseling/discussion   °• Pressure injury of skin 07/19/2020  °• Status post tracheostomy (HCC)   °• Acute respiratory failure with hypoxia (HCC)   °• Traumatic hemorrhage of cerebrum with loss of consciousness of 30 minutes or less (HCC)   °• Tachypnea   °• Encephalopathy acute 06/29/2020  °• Subarachnoid hemorrhage following injury ([facility]) 06/29/2020  ° ° °Palliative Care Assessment & Plan  ° °Recommendations/Plan: °- DNR/DNI °- no further escalation of care °- no labs °- morphine 2 mg every 3 hours as needed for pain °- additional family planning to visit Sunday 8/8 °- plan for removal of ventilatory support Monday 8/9 at 9:30a per family request ° °Goals of Care and Additional Recommendations: °· Limitations on Scope of Treatment: No Lab Draws ° °Code Status: DNR/DNI ° °Prognosis: °· Hours to days after ventilator is removed ° °Discharge Planning: °· To Be Determined (likely [facility]) ° ° °Thank you for allowing the Palliative Medicine Team to assist in the care of this patient. ° ° °Total Time 25 minutes Prolonged Time Billed  no   ° °   °Greater than 50%  of this time was spent counseling and coordinating care related to the above assessment and plan. ° °Julia B Mcilquham, NP ° °Please contact Palliative Medicine Team phone at [phone] for questions and concerns.  ° ° ° ° °

## 2020-07-21 NOTE — Progress Notes (Addendum)
PROGRESS NOTE  Harold Mckee HER:[id number] DOB: [birth year] DOA: 06/29/2020 PCP: Patient, No Pcp Per  HPI/Recap of past 24 hours: Patient was admitted to the hospital with a working diagnosis of bilateral subarachnoid/subdural hemorrhage secondary to left skull fracture due to mechanical fall. Hospitalization complicated by sepsis due to Klebsiella pneumonia, endorgan failure acute hypoxic respiratory failure (not present on admission).  74 year old male who presented after mechanical fall. He does have significant past medical history for hypertension and type 2 diabetes mellitus. He fell from ground-level resulting in head injury while walking his dog in the park. Apparently patient had been drinking alcohol. On his initial physical examination he was bleeding from his left ear, his vital signs showed a blood pressure 134/76, heart rate 94, temperature 97.4, respiratory 22, oxygen saturation 98%. He was altered, unable to follow commands, his lungs are clear to auscultation bilaterally, heart S1-S2, present rhythmic, his abdomen was soft, no lower extremity edema.  CT head and spine showed nondisplaced fracture of the left parietal calvarium along the leftlambdoid suture extending into the left occipital calvarium and left temporal bone with involvement of the left mastoid air cell and external auditory canal. Bilateral subdural and subarachnoid hemorrhages. No midline shift. Focal hemorrhage with small pockets of pneumocephalus in the left posterior temporal lobe along the calvarial fracture.  Patient was admitted to the neuro intensive care unit, he had frequent neuro checks and received.  Patient was evaluated by ENT, no intervention recommended at this point.  On July 14 patient developed fever and worsening leukocytosis, lactic 5.1,he was started onshort course ofempiric antibiotic therapy with cefepimeand vancomycin for 7 days. He continued to have persistent  encephalopathy.  July 15 he had progressive worsening mental status, and was emergently intubated and placed on mechanical ventilation.  He was placed on pressure support ventilation, with good toleration but he was unable to protect his airway due to his persistent encephalopathy. Further work-up with electroencephalography wasnegative for seizures.  Patient underwent percutaneous tracheostomy on July24.Patient had a persistent leukocytosis and significant sputum production. He was diagnosed with Klebsiella pneumonia on July 25th and placed on intravenous ceftriaxone.  Patient continue to failed spontaneous breathing trials.  PMT had goals of care discussion with family 8/3 and plan is to transition to comfort care on Monday to give time for other family members.  No escalation of treatment moving forward til then per family request.  07/21/20: Seen and examined. No significant changes from previous exam and assessment.  Poor prognosis.  Assessment/Plan: Principal Problem:   Subarachnoid hemorrhage following injury ([place]) Active Problems:   Encephalopathy acute   Tachypnea   Traumatic hemorrhage of cerebrum with loss of consciousness of 30 minutes or less (HCC)   Acute respiratory failure with hypoxia (HCC)   Status post tracheostomy ([place])   Pressure injury of skin   Palliative care by specialist   Goals of care, counseling/discussion  1. Traumatic left skull fracture with bilateral subarachnoid and subdural hematoma. Notresponsive or interactive.Noclinical signs of active seizures.  He is not able to tolerate SBT due to rapid increase work of breathing. He has been maintained on mandatory mode of mechanical ventilation.   Trach care and aspiration precautions. Patient with very poor prognosis.   On keppra per tube.  2. Acute hypoxic respiratory failure due to ventilator associated pneumonia, complicated with sepsis not present on admission. Personally reviewed chest  film from 07.23 with positive infiltrate at the right lower lobe.Sputum culture positive for klebsiella resistant to ampicillin and sulbactam.  Fi02 is 30% with oxygen saturation 100 %.  PRVC Fi02 30%, Peep 5, RR 16, Vt 630 cc.  Completed antibiotic therapy.   Patient with ventilator dependent respiratory failure. Poor prognosis.   3. Temporal bone fracture. Continue supportive medical therapy, eventually will need outpatient follow up with ENT.  Seen by neurosurgery.  4. Uncontrolled T2DM Hg A1c 8,5.  Serum glucose today 99.  Cut down Lantus 15 units twice daily, cut down NovoLog 4 units every 4 hours Sensitive insulin sliding scale.  5. Alcohol intoxication. Noclinical signs of withdrawal.   6. Resolved Hypernatremia/ hypokalemia. Sodium trending down, today at 145> 142 with K at 4.2> 4.3 and serum bicarbonate at 26,   Free water flushes per peg tube at250 cc q 3 H.Follow renal panel in am.   7. HTN.continue withmetoprolol and amlodipinefor blood pressure control    Status is: Inpatient  Remains inpatient appropriate because:Inpatient level of care appropriate due to severity of illness   Dispo: The patient is from: Home  Anticipated d/c is to: Likely hospital death afer removal of ventilator support on 07/26/20 requested by family.  Anticipated d/c date is: 07/26/20.  Patient currently is not medically stable to d/c.   DVT prophylaxis:      scd   Code Status:              DNR as of 07/20/20 per family request. Family Communication:       No family at the bedside      Nutrition Status: Nutrition Problem: Inadequate oral intake Etiology: inability to eat Signs/Symptoms: NPO status Interventions: MVI, Prostat, Tube feeding     Skin Documentation:   Consultants:   ENT   Neurosurgery   Procedures:   Trach   Peg            Objective: Vitals:   07/21/20 1100 07/21/20 1105 07/21/20  1200 07/21/20 1300  BP: 114/69  111/69 102/83  Pulse: 80 88 82 86  Resp: _0 Temp:   98 F (36.7 C)   TempSrc:   Oral   SpO2: 98%  99% 100%  Weight:      Height:        Intake/Output Summary (Last 24 hours) at 07/21/2020 1356 Last data filed at 07/21/2020 1300 Gross per 24 hour  Intake 2565 ml  Output 2200 ml  Net 365 ml   Filed Weights   07/18/20 0349 07/20/20 0500 07/21/20 0500  Weight: 72.5 kg 73 kg 73 kg    Exam: No changes from previous exam.  . General: 74 y.o. year-old male well-developed well-nourished no acute stress.  Eyes open. Responds to painful stimuli.  Does not follow commands. . Cardiovascular: Tachycardic no rubs or gallops. Marland Kitchen Respiratory: Clear to auscultation no wheezes no rales.   . Abdomen: Soft nontender normal bowel sounds present.  PEG tube in place.  . Musculoskeletal: Trace lower extremity edema bilaterally.   Marland Kitchen Psychiatry: Unable to assess monitor altered mental status.  Data Reviewed: CBC: Recent Labs  Lab 07/16/20 0433  WBC 9.5  NEUTROABS 7.0  HGB 8.9*  HCT 29.1*  MCV 89.0  PLT 559   Basic Metabolic Panel: Recent Labs  Lab 07/16/20 0433 07/17/20 0453 07/18/20 0419 07/19/20 0632  NA 147* 145 142 142  K 3.2* 4.2 4.3 4.3  CL 106 108 109 106  CO2 _1 GLUCOSE 168* 118* 108* 99  BUN 35* 33* 30* 26*  CREATININE 0.71 0.71 0.59* 0.56*  CALCIUM 10.1 10.0 9.5 9.2   GFR: Estimated Creatinine Clearance: 82.4 mL/min (A) (by C-G formula based on SCr of 0.56 mg/dL (L)). Liver Function Tests: No results for input(s): AST, ALT, ALKPHOS, BILITOT, PROT, ALBUMIN in the last 168 hours. No results for input(s): LIPASE, AMYLASE in the last 168 hours. No results for input(s): AMMONIA in the last 168 hours. Coagulation Profile: No results for input(s): INR, PROTIME in the last 168 hours. Cardiac Enzymes: No results for input(s): CKTOTAL, CKMB, CKMBINDEX, TROPONINI in the last 168 hours. BNP (last 3 results) No results for  input(s): PROBNP in the last 8760 hours. HbA1C: No results for input(s): HGBA1C in the last 72 hours. CBG: Recent Labs  Lab 07/20/20 1937 07/20/20 2327 07/21/20 0326 07/21/20 0736 07/21/20 1228  GLUCAP 140* 142* 125* 129* 166*   Lipid Profile: No results for input(s): CHOL, HDL, LDLCALC, TRIG, CHOLHDL, LDLDIRECT in the last 72 hours. Thyroid Function Tests: No results for input(s): TSH, T4TOTAL, FREET4, T3FREE, THYROIDAB in the last 72 hours. Anemia Panel: No results for input(s): VITAMINB12, FOLATE, FERRITIN, TIBC, IRON, RETICCTPCT in the last 72 hours. Urine analysis:    Component Value Date/Time   COLORURINE YELLOW 06/30/2020 1650   APPEARANCEUR CLEAR 06/30/2020 1650   LABSPEC 1.018 06/30/2020 1650   PHURINE 5.0 06/30/2020 1650   GLUCOSEU >=500 (A) 06/30/2020 1650   HGBUR SMALL (A) 06/30/2020 1650   BILIRUBINUR NEGATIVE 06/30/2020 1650   KETONESUR 5 (A) 06/30/2020 1650   PROTEINUR NEGATIVE 06/30/2020 1650   NITRITE NEGATIVE 06/30/2020 1650   LEUKOCYTESUR NEGATIVE 06/30/2020 1650   Sepsis Labs: _0 (procalcitonin:4,lacticidven:4)  ) No results found for this or any previous visit (from the past 240 hour(s)).    Studies: No results found.  Scheduled Meds: . amLODipine  10 mg Per Tube Daily  . chlorhexidine gluconate (MEDLINE KIT)  15 mL Mouth Rinse BID  . Chlorhexidine Gluconate Cloth  6 each Topical Q0600  . docusate  100 mg Per Tube BID  . feeding supplement (PROSource TF)  90 mL Per Tube BID  . free water  250 mL Per Tube Q6H  . heparin  5,000 Units Subcutaneous Q8H  . insulin aspart  0-9 Units Subcutaneous Q4H  . insulin aspart  4 Units Subcutaneous Q4H  . insulin detemir  15 Units Subcutaneous BID  . levETIRAcetam  1,000 mg Per Tube BID  . mouth rinse  15 mL Mouth Rinse 10 times per day  . metoprolol tartrate  50 mg Per Tube BID  . multivitamin with minerals  1 tablet Per Tube Daily  . pantoprazole sodium  40 mg Per Tube QHS  . polyethylene  glycol  17 g Per Tube BID  . Semaglutide(0.25 or 0.5MG/DOS)  0.5067 mg Subcutaneous Q Wed  . sennosides  5 mL Per Tube BID    Continuous Infusions: . sodium chloride Stopped (07/19/20 1448)  . feeding supplement (GLUCERNA 1.2 CAL) 1,000 mL (07/21/20 0100)     LOS: 22 days     Kayleen Memos, MD [facility] Pager [phone]  If 7PM-7AM, please contact night-coverage [email or web address] Password [email or web address] 07/21/2020, 1:56 PM

## 2020-07-22 LAB — GLUCOSE, CAPILLARY
Glucose-Capillary: 127 mg/dL — ABNORMAL HIGH (ref 70–99)
Glucose-Capillary: 90 mg/dL (ref 70–99)
Glucose-Capillary: 141 mg/dL — ABNORMAL HIGH (ref 70–99)
Glucose-Capillary: 183 mg/dL — ABNORMAL HIGH (ref 70–99)
Glucose-Capillary: 157 mg/dL — ABNORMAL HIGH (ref 70–99)
Glucose-Capillary: 174 mg/dL — ABNORMAL HIGH (ref 70–99)

## 2020-07-22 MED ORDER — INSULIN DETEMIR 100 UNIT/ML ~~LOC~~ SOLN
7.0000 [IU] | Freq: Two times a day (BID) | SUBCUTANEOUS | Status: DC
  Administered 2020-07-22 – 2020-07-25 (×7): 7 [IU] via SUBCUTANEOUS
  Filled 2020-07-22 (×8): qty 0.07

## 2020-07-22 MED ORDER — INSULIN ASPART 100 UNIT/ML ~~LOC~~ SOLN: 3.0000 [IU] | SUBCUTANEOUS | Status: DC

## 2020-07-22 NOTE — Progress Notes (Signed)
Nutrition Follow-up  DOCUMENTATION CODES:   Not applicable  INTERVENTION:   Tube feeding via PEG: Glucerna 1.2 at 65 ml/h (1560 ml per day) Prosource TF 90 ml BID  250 ml free water every 6 hours  Provides 2032 kcal, 137 gm protein, 1265 ml free water daily Total free water: 2265 ml   NUTRITION DIAGNOSIS:   Inadequate oral intake related to inability to eat as evidenced by NPO status. Ongoing.   GOAL:   Patient will meet greater than or equal to 90% of their needs Met with EN  MONITOR:   TF tolerance, Diet advancement  REASON FOR ASSESSMENT:   Consult Enteral/tube feeding initiation and management  ASSESSMENT:   Pt with PMH of HTN and DM admitted after fall while walking his dog with L skull fx and bilateral SAH/SDH.   Per Palliative plan for comfort care 8/9 after family visits.    7/23 s/p trach placement  7/26 PEG placement   Patient is currently intubated on ventilator support MV: 12.2 L/min Temp (24hrs), Avg:98.4 F (36.9 C), Min:97.9 F (36.6 C), Max:99 F (37.2 C)   Medications reviewed and include: colace, folic acid, SSI, 7 units levemir BID, MVI, miralax, senokot   Labs reviewed  Diet Order:   Diet Order            Diet NPO time specified  Diet effective now                 EDUCATION NEEDS:   No education needs have been identified at this time  Skin:  Skin Assessment: Reviewed RN Assessment  Last BM:  8/4  Height:   Ht Readings from Last 1 Encounters:  06/30/20 _0  (1.854 m)    Weight:   Wt Readings from Last 1 Encounters:  07/22/20 71.7 kg    Ideal Body Weight:  83.6 kg  BMI:  Body mass index is 20.85 kg/m.  Estimated Nutritional Needs:   Kcal:  2100  Protein:  120-130 grams  Fluid:  2 L/day  Lockie Pares., RD, LDN, CNSC See AMiON for contact information

## 2020-07-22 NOTE — Progress Notes (Signed)
° °  Providing Compassionate, Quality Care - Together  NEUROSURGERY PROGRESS NOTE   S: No issues overnight  O: EXAM:  BP 108/68    Pulse 86    Temp 97.9 F (36.6 C) (Axillary)    Resp 17    Ht 6\' 1"  (1.854 m)    Wt 71.7 kg    SpO2 99%    BMI 20.85 kg/m   Eyes closed PERRL Trach in place Min movement in BUE WD to pain in BLE [place] to pain  ASSESSMENT:  74 y.o. male with  1. Left occipital parietotemporal calvarial fracture secondary to fall 2.Left temporal contusion 3.Bilateral frontal traumatic subarachnoidhemorrhage & contusions/subdural hematomaswith cerebral edema 4.TBI, severe 5. HCAP  PLAN: -neuro ICU -Q4hr neurochecks -Keppra 1000 mg twice daily(EEG negx2) -s/p PEG -Blood pressure control -ok for SQH -CT brain stable 7/20 -s/p trach -palliative on board, planned withdrawal on Monday, family visiting this weekend   Please do not hesitate to call with questions or concerns.   Saturday, DO Neurosurgeon [facility] Cell: [phone]

## 2020-07-22 NOTE — Progress Notes (Signed)
PROGRESS NOTE  Harold Mckee VZD:[id number] DOB: [birth year] DOA: 06/29/2020 PCP: Patient, No Pcp Per  HPI/Recap of past 24 hours: Patient was admitted to the hospital with a working diagnosis of bilateral subarachnoid/subdural hemorrhage secondary to left skull fracture due to mechanical fall. Hospitalization complicated by sepsis due to Klebsiella pneumonia, endorgan failure acute hypoxic respiratory failure (not present on admission).  74 year old male who presented after mechanical fall. He does have significant past medical history for hypertension and type 2 diabetes mellitus. He fell from ground-level resulting in head injury while walking his dog in the park. Apparently patient had been drinking alcohol. On his initial physical examination he was bleeding from his left ear, his vital signs showed a blood pressure 134/76, heart rate 94, temperature 97.4, respiratory 22, oxygen saturation 98%. He was altered, unable to follow commands, his lungs are clear to auscultation bilaterally, heart S1-S2, present rhythmic, his abdomen was soft, no lower extremity edema.  CT head and spine showed nondisplaced fracture of the left parietal calvarium along the leftlambdoid suture extending into the left occipital calvarium and left temporal bone with involvement of the left mastoid air cell and external auditory canal. Bilateral subdural and subarachnoid hemorrhages. No midline shift. Focal hemorrhage with small pockets of pneumocephalus in the left posterior temporal lobe along the calvarial fracture.  Patient was admitted to the neuro intensive care unit, he had frequent neuro checks and received.  Patient was evaluated by ENT, no intervention recommended at this point.  On July 14 patient developed fever and worsening leukocytosis, lactic 5.1,he was started onshort course ofempiric antibiotic therapy with cefepimeand vancomycin for 7 days. He continued to have persistent  encephalopathy.  July 15 he had progressive worsening mental status, and was emergently intubated and placed on mechanical ventilation.  He was placed on pressure support ventilation, with good toleration but he was unable to protect his airway due to his persistent encephalopathy. Further work-up with electroencephalography wasnegative for seizures.  Patient underwent percutaneous tracheostomy on July24.Patient had a persistent leukocytosis and significant sputum production. He was diagnosed with Klebsiella pneumonia on July 25th and placed on intravenous ceftriaxone.  Patient continue to failed spontaneous breathing trials.  PMT had goals of care discussion with family 8/3 and plan is to transition to comfort care on Monday to give time for other family members.  No escalation of treatment moving forward til then per family request.  07/22/20: Seen and examined. No significant changes from previous exam and assessment.  Poor prognosis.  Assessment/Plan: Principal Problem:   Subarachnoid hemorrhage following injury ([place]) Active Problems:   Encephalopathy acute   Tachypnea   Traumatic hemorrhage of cerebrum with loss of consciousness of 30 minutes or less (HCC)   Acute respiratory failure with hypoxia (HCC)   Status post tracheostomy ([place])   Pressure injury of skin   Palliative care by specialist   Goals of care, counseling/discussion  1. Traumatic left skull fracture with bilateral subarachnoid and subdural hematoma. Notresponsive or interactive.Noclinical signs of active seizures.  He is not able to tolerate SBT due to rapid increase work of breathing. He has been maintained on mandatory mode of mechanical ventilation.   Trach care and aspiration precautions. Patient with very poor prognosis.   On keppra per tube.  2. Acute hypoxic respiratory failure due to ventilator associated pneumonia, complicated with sepsis not present on admission. Personally reviewed chest  film from 07.23 with positive infiltrate at the right lower lobe.Sputum culture positive for klebsiella resistant to ampicillin and sulbactam.  Fi02 is 30% with oxygen saturation 100 %.  PRVC Fi02 30%, Peep 5, RR 16, Vt 630 cc.  Completed antibiotic therapy.   Patient with ventilator dependent respiratory failure. Poor prognosis.   3. Temporal bone fracture. Continue supportive medical therapy, eventually will need outpatient follow up with ENT.  Seen by neurosurgery.  4. Uncontrolled T2DM Hg A1c 8,5.  Serum glucose today 99.  Cut down Lantus 15 units twice daily, cut down NovoLog 4 units every 4 hours Sensitive insulin sliding scale.  5. Alcohol intoxication. Noclinical signs of withdrawal.   6. Resolved Hypernatremia/ hypokalemia. Sodium trending down, today at 145> 142 with K at 4.2> 4.3 and serum bicarbonate at 26,   Free water flushes per peg tube at250 cc q 3 H.Follow renal panel in am.   7. HTN.continue withmetoprolol and amlodipinefor blood pressure control    Status is: Inpatient  Remains inpatient appropriate because:Inpatient level of care appropriate due to severity of illness   Dispo: The patient is from: Home  Anticipated d/c is to: Likely hospital death afer removal of ventilator support on 07/26/20 requested by family.  Anticipated d/c date is: 07/26/20.  Patient currently is not medically stable to d/c.   DVT prophylaxis:      scd   Code Status:              DNR as of 07/20/20 per family request. Family Communication:       No family at the bedside      Nutrition Status: Nutrition Problem: Inadequate oral intake Etiology: inability to eat Signs/Symptoms: NPO status Interventions: MVI, Prostat, Tube feeding     Skin Documentation:   Consultants:   ENT   Neurosurgery   Procedures:   Trach   Peg            Objective: Vitals:   07/22/20 0900 07/22/20 1000 07/22/20  1100 07/22/20 1200  BP: 122/73 117/72 118/73 108/68  Pulse: (!) 104 (!) 109 (!) 111 86  Resp: 19 17 (!) 22 17  Temp:    97.9 F (36.6 C)  TempSrc:    Axillary  SpO2: 98% 99% 98% 99%  Weight:      Height:        Intake/Output Summary (Last 24 hours) at 07/22/2020 1506 Last data filed at 07/22/2020 1150 Gross per 24 hour  Intake 1835 ml  Output 1850 ml  Net -15 ml   Filed Weights   07/20/20 0500 07/21/20 0500 07/22/20 0500  Weight: 73 kg 73 kg 71.7 kg    Exam: No changes from previous exam.  . General: 74 y.o. year-old male well-developed well-nourished no acute stress.  Eyes open. Responds to painful stimuli.  Does not follow commands. . Cardiovascular: Tachycardic no rubs or gallops. Marland Kitchen Respiratory: Clear to auscultation no wheezes no rales.   . Abdomen: Soft nontender normal bowel sounds present.  PEG tube in place.  . Musculoskeletal: Trace lower extremity edema bilaterally.   Marland Kitchen Psychiatry: Unable to assess monitor altered mental status.  Data Reviewed: CBC: Recent Labs  Lab 07/16/20 0433  WBC 9.5  NEUTROABS 7.0  HGB 8.9*  HCT 29.1*  MCV 89.0  PLT 161   Basic Metabolic Panel: Recent Labs  Lab 07/16/20 0433 07/17/20 0453 07/18/20 0419 07/19/20 0632  NA 147* 145 142 142  K 3.2* 4.2 4.3 4.3  CL 106 108 109 106  CO2 '29 26 24 26  ' GLUCOSE 168* 118* 108* 99  BUN 35* 33* 30* 26*  CREATININE 0.71  0.71 0.59* 0.56*  CALCIUM 10.1 10.0 9.5 9.2   GFR: Estimated Creatinine Clearance: 80.9 mL/min (A) (by C-G formula based on SCr of 0.56 mg/dL (L)). Liver Function Tests: No results for input(s): AST, ALT, ALKPHOS, BILITOT, PROT, ALBUMIN in the last 168 hours. No results for input(s): LIPASE, AMYLASE in the last 168 hours. No results for input(s): AMMONIA in the last 168 hours. Coagulation Profile: No results for input(s): INR, PROTIME in the last 168 hours. Cardiac Enzymes: No results for input(s): CKTOTAL, CKMB, CKMBINDEX, TROPONINI in the last 168 hours. BNP (last  3 results) No results for input(s): PROBNP in the last 8760 hours. HbA1C: No results for input(s): HGBA1C in the last 72 hours. CBG: Recent Labs  Lab 07/21/20 1940 07/21/20 2336 07/22/20 0314 07/22/20 0738 07/22/20 1106  GLUCAP 129* 169* 127* 90 141*   Lipid Profile: No results for input(s): CHOL, HDL, LDLCALC, TRIG, CHOLHDL, LDLDIRECT in the last 72 hours. Thyroid Function Tests: No results for input(s): TSH, T4TOTAL, FREET4, T3FREE, THYROIDAB in the last 72 hours. Anemia Panel: No results for input(s): VITAMINB12, FOLATE, FERRITIN, TIBC, IRON, RETICCTPCT in the last 72 hours. Urine analysis:    Component Value Date/Time   COLORURINE YELLOW 06/30/2020 1650   APPEARANCEUR CLEAR 06/30/2020 1650   LABSPEC 1.018 06/30/2020 1650   PHURINE 5.0 06/30/2020 1650   GLUCOSEU >=500 (A) 06/30/2020 1650   HGBUR SMALL (A) 06/30/2020 1650   BILIRUBINUR NEGATIVE 06/30/2020 1650   KETONESUR 5 (A) 06/30/2020 1650   PROTEINUR NEGATIVE 06/30/2020 1650   NITRITE NEGATIVE 06/30/2020 1650   LEUKOCYTESUR NEGATIVE 06/30/2020 1650   Sepsis Labs: '@LABRCNTIP' (procalcitonin:4,lacticidven:4)  ) No results found for this or any previous visit (from the past 240 hour(s)).    Studies: No results found.  Scheduled Meds: . amLODipine  10 mg Per Tube Daily  . chlorhexidine gluconate (MEDLINE KIT)  15 mL Mouth Rinse BID  . Chlorhexidine Gluconate Cloth  6 each Topical Q0600  . docusate  100 mg Per Tube BID  . feeding supplement (PROSource TF)  90 mL Per Tube BID  . free water  250 mL Per Tube Q6H  . heparin  5,000 Units Subcutaneous Q8H  . insulin aspart  0-9 Units Subcutaneous Q4H  . insulin detemir  7 Units Subcutaneous BID  . levETIRAcetam  1,000 mg Per Tube BID  . mouth rinse  15 mL Mouth Rinse 10 times per day  . metoprolol tartrate  50 mg Per Tube BID  . multivitamin with minerals  1 tablet Per Tube Daily  . pantoprazole sodium  40 mg Per Tube QHS  . polyethylene glycol  17 g Per Tube BID   . Semaglutide(0.25 or 0.5MG/DOS)  0.5067 mg Subcutaneous Q Wed  . sennosides  5 mL Per Tube BID    Continuous Infusions: . sodium chloride Stopped (07/19/20 1448)  . feeding supplement (GLUCERNA 1.2 CAL) 1,000 mL (07/22/20 0415)     LOS: 23 days     Kayleen Memos, MD [facility] Pager [phone]  If 7PM-7AM, please contact night-coverage [email or web address] Password [email or web address] 07/22/2020, 3:06 PM

## 2020-07-23 LAB — GLUCOSE, CAPILLARY
Glucose-Capillary: 160 mg/dL — ABNORMAL HIGH (ref 70–99)
Glucose-Capillary: 214 mg/dL — ABNORMAL HIGH (ref 70–99)

## 2020-07-23 NOTE — Progress Notes (Signed)
   Providing Compassionate, Quality Care - Together  NEUROSURGERY PROGRESS NOTE   S: No issues overnight. Family visiting this weekend  O: EXAM:  BP 104/71   Pulse 95   Temp 98.5 F (36.9 C) (Axillary)   Resp 16   Ht 6\' 1"  (1.854 m)   Wt 71.7 kg   SpO2 100%   BMI 20.85 kg/m   Eyes closed PERRL Trach in place Min movement in BUE WD to pain in BLE [place] to pain  ASSESSMENT:  74 y.o. male with  1. Left occipital parietotemporal calvarial fracture secondary to fall 2.Left temporal contusion 3.Bilateral frontal traumatic subarachnoidhemorrhage & contusions/subdural hematomaswith cerebral edema 4.TBI, severe 5. HCAP  PLAN: -neuro ICU -Q4hr neurochecks -Keppra 1000 mg twice daily(EEG negx2) -s/p PEG -Blood pressure control -ok for SQH -CT brain stable 7/20 -s/p trach -palliative on board, planned withdrawal on Monday, family visiting this weekend    Please do not hesitate to call with questions or concerns.   Wednesday, DO Neurosurgeon [facility] Cell: [phone]

## 2020-07-23 NOTE — Progress Notes (Signed)
PROGRESS NOTE  Harold Mckee ZWC:[id number] DOB: [birth year] DOA: 06/29/2020 PCP: Patient, No Pcp Per  HPI/Recap of past 24 hours: Patient was admitted to the hospital with a working diagnosis of bilateral subarachnoid/subdural hemorrhage secondary to left skull fracture due to mechanical fall. Hospitalization complicated by sepsis due to Klebsiella pneumonia, endorgan failure acute hypoxic respiratory failure (not present on admission).  74 year old male who presented after mechanical fall. He does have significant past medical history for hypertension and type 2 diabetes mellitus. He fell from ground-level resulting in head injury while walking his dog in the park. Apparently patient had been drinking alcohol. On his initial physical examination he was bleeding from his left ear, his vital signs showed a blood pressure 134/76, heart rate 94, temperature 97.4, respiratory 22, oxygen saturation 98%. He was altered, unable to follow commands, his lungs are clear to auscultation bilaterally, heart S1-S2, present rhythmic, his abdomen was soft, no lower extremity edema.  CT head and spine showed nondisplaced fracture of the left parietal calvarium along the leftlambdoid suture extending into the left occipital calvarium and left temporal bone with involvement of the left mastoid air cell and external auditory canal. Bilateral subdural and subarachnoid hemorrhages. No midline shift. Focal hemorrhage with small pockets of pneumocephalus in the left posterior temporal lobe along the calvarial fracture.  Patient was admitted to the neuro intensive care unit, he had frequent neuro checks and received.  Patient was evaluated by ENT, no intervention recommended at this point.  On July 14 patient developed fever and worsening leukocytosis, lactic 5.1,he was started onshort course ofempiric antibiotic therapy with cefepimeand vancomycin for 7 days. He continued to have persistent  encephalopathy.  July 15 he had progressive worsening mental status, and was emergently intubated and placed on mechanical ventilation.  He was placed on pressure support ventilation, with good toleration but he was unable to protect his airway due to his persistent encephalopathy. Further work-up with electroencephalography wasnegative for seizures.  Patient underwent percutaneous tracheostomy on July24.Patient had a persistent leukocytosis and significant sputum production. He was diagnosed with Klebsiella pneumonia on July 25th and placed on intravenous ceftriaxone.  Patient continue to failed spontaneous breathing trials.  PMT had goals of care discussion with family 8/3 and plan is to transition to comfort care on Monday to give time for other family members.  No escalation of treatment moving forward til then per family request.  07/23/20: Seen and examined. No significant changes from previous exam and assessment.  Poor prognosis.  Assessment/Plan: Principal Problem:   Subarachnoid hemorrhage following injury ([place]) Active Problems:   Encephalopathy acute   Tachypnea   Traumatic hemorrhage of cerebrum with loss of consciousness of 30 minutes or less (HCC)   Acute respiratory failure with hypoxia (HCC)   Status post tracheostomy ([place])   Pressure injury of skin   Palliative care by specialist   Goals of care, counseling/discussion  1. Traumatic left skull fracture with bilateral subarachnoid and subdural hematoma. Notresponsive or interactive.Noclinical signs of active seizures.  He is not able to tolerate SBT due to rapid increase work of breathing. He has been maintained on mandatory mode of mechanical ventilation.   Trach care and aspiration precautions. Patient with very poor prognosis.   On keppra per tube.  2. Acute hypoxic respiratory failure due to ventilator associated pneumonia, complicated with sepsis not present on admission. Personally reviewed chest  film from 07.23 with positive infiltrate at the right lower lobe.Sputum culture positive for klebsiella resistant to ampicillin and sulbactam.  Fi02 is 30% with oxygen saturation 100 %.  PRVC Fi02 30%, Peep 5, RR 16, Vt 630 cc.  Completed antibiotic therapy.   Patient with ventilator dependent respiratory failure. Poor prognosis.   3. Temporal bone fracture. Continue supportive medical therapy, eventually will need outpatient follow up with ENT.  Seen by neurosurgery.  4. Uncontrolled T2DM Hg A1c 8,5.  Serum glucose today 99.  Cut down Lantus 15 units twice daily, cut down NovoLog 4 units every 4 hours Sensitive insulin sliding scale.  5. Alcohol intoxication. Noclinical signs of withdrawal.   6. Resolved Hypernatremia/ hypokalemia. Sodium trending down, today at 145> 142 with K at 4.2> 4.3 and serum bicarbonate at 26,   Free water flushes per peg tube at250 cc q 3 H.Follow renal panel in am.   7. HTN.continue withmetoprolol and amlodipinefor blood pressure control    Status is: Inpatient  Remains inpatient appropriate because:Inpatient level of care appropriate due to severity of illness   Dispo: The patient is from: Home  Anticipated d/c is to: Likely hospital death afer removal of ventilator support on 07/26/20 requested by family.  Anticipated d/c date is: 07/26/20.  Patient currently is not medically stable to d/c.   DVT prophylaxis:      scd   Code Status:              DNR as of 07/20/20 per family request. Family Communication:       No family at the bedside      Nutrition Status: Nutrition Problem: Inadequate oral intake Etiology: inability to eat Signs/Symptoms: NPO status Interventions: MVI, Prostat, Tube feeding     Skin Documentation:   Consultants:   ENT   Neurosurgery   Procedures:   Trach   Peg            Objective: Vitals:   07/23/20 1432 07/23/20 1500 07/23/20  1554 07/23/20 1600  BP:  128/74  129/75  Pulse: 93 91  93  Resp: _0 Temp:   99.2 F (37.3 C)   TempSrc:   Axillary   SpO2: 100% 100%  100%  Weight:      Height:        Intake/Output Summary (Last 24 hours) at 07/23/2020 1718 Last data filed at 07/23/2020 1600 Gross per 24 hour  Intake 2310 ml  Output 2000 ml  Net 310 ml   Filed Weights   07/20/20 0500 07/21/20 0500 07/22/20 0500  Weight: 73 kg 73 kg 71.7 kg    Exam: No changes from previous exam.  . General: 74 y.o. year-old male well-developed well-nourished no acute stress.  Eyes open. Responds to painful stimuli.  Does not follow commands. . Cardiovascular: Tachycardic no rubs or gallops. Marland Kitchen Respiratory: Clear to auscultation no wheezes no rales.   . Abdomen: Soft nontender normal bowel sounds present.  PEG tube in place.  . Musculoskeletal: Trace lower extremity edema bilaterally.   Marland Kitchen Psychiatry: Unable to assess monitor altered mental status.  Data Reviewed: CBC: No results for input(s): WBC, NEUTROABS, HGB, HCT, MCV, PLT in the last 168 hours. Basic Metabolic Panel: Recent Labs  Lab 07/17/20 0453 07/18/20 0419 07/19/20 0632  NA 145 142 142  K 4.2 4.3 4.3  CL 108 109 106  CO2 _1 GLUCOSE 118* 108* 99  BUN 33* 30* 26*  CREATININE 0.71 0.59* 0.56*  CALCIUM 10.0 9.5 9.2   GFR: Estimated Creatinine Clearance: 80.9 mL/min (A) (by C-G formula based on SCr of  0.56 mg/dL (L)). Liver Function Tests: No results for input(s): AST, ALT, ALKPHOS, BILITOT, PROT, ALBUMIN in the last 168 hours. No results for input(s): LIPASE, AMYLASE in the last 168 hours. No results for input(s): AMMONIA in the last 168 hours. Coagulation Profile: No results for input(s): INR, PROTIME in the last 168 hours. Cardiac Enzymes: No results for input(s): CKTOTAL, CKMB, CKMBINDEX, TROPONINI in the last 168 hours. BNP (last 3 results) No results for input(s): PROBNP in the last 8760 hours. HbA1C: No results for input(s): HGBA1C  in the last 72 hours. CBG: Recent Labs  Lab 07/22/20 1106 07/22/20 1558 07/22/20 1920 07/22/20 2307 07/23/20 0738  GLUCAP 141* 183* 157* 174* 160*   Lipid Profile: No results for input(s): CHOL, HDL, LDLCALC, TRIG, CHOLHDL, LDLDIRECT in the last 72 hours. Thyroid Function Tests: No results for input(s): TSH, T4TOTAL, FREET4, T3FREE, THYROIDAB in the last 72 hours. Anemia Panel: No results for input(s): VITAMINB12, FOLATE, FERRITIN, TIBC, IRON, RETICCTPCT in the last 72 hours. Urine analysis:    Component Value Date/Time   COLORURINE YELLOW 06/30/2020 1650   APPEARANCEUR CLEAR 06/30/2020 1650   LABSPEC 1.018 06/30/2020 1650   PHURINE 5.0 06/30/2020 1650   GLUCOSEU >=500 (A) 06/30/2020 1650   HGBUR SMALL (A) 06/30/2020 1650   BILIRUBINUR NEGATIVE 06/30/2020 1650   KETONESUR 5 (A) 06/30/2020 1650   PROTEINUR NEGATIVE 06/30/2020 1650   NITRITE NEGATIVE 06/30/2020 1650   LEUKOCYTESUR NEGATIVE 06/30/2020 1650   Sepsis Labs: _0 (procalcitonin:4,lacticidven:4)  ) No results found for this or any previous visit (from the past 240 hour(s)).    Studies: No results found.  Scheduled Meds: . amLODipine  10 mg Per Tube Daily  . chlorhexidine gluconate (MEDLINE KIT)  15 mL Mouth Rinse BID  . Chlorhexidine Gluconate Cloth  6 each Topical Q0600  . docusate  100 mg Per Tube BID  . feeding supplement (PROSource TF)  90 mL Per Tube BID  . free water  250 mL Per Tube Q6H  . heparin  5,000 Units Subcutaneous Q8H  . insulin aspart  0-9 Units Subcutaneous Q4H  . insulin detemir  7 Units Subcutaneous BID  . levETIRAcetam  1,000 mg Per Tube BID  . mouth rinse  15 mL Mouth Rinse 10 times per day  . metoprolol tartrate  50 mg Per Tube BID  . multivitamin with minerals  1 tablet Per Tube Daily  . pantoprazole sodium  40 mg Per Tube QHS  . polyethylene glycol  17 g Per Tube BID  . Semaglutide(0.25 or 0.5MG/DOS)  0.5067 mg Subcutaneous Q Wed  . sennosides  5 mL Per Tube BID     Continuous Infusions: . sodium chloride Stopped (07/19/20 1448)  . feeding supplement (GLUCERNA 1.2 CAL) 65 mL/hr at 07/23/20 1300     LOS: 24 days     Kayleen Memos, MD [facility] Pager [phone]  If 7PM-7AM, please contact night-coverage [email or web address] Password [email or web address] 07/23/2020, 5:18 PM

## 2020-07-24 LAB — GLUCOSE, CAPILLARY
Glucose-Capillary: 181 mg/dL — ABNORMAL HIGH (ref 70–99)
Glucose-Capillary: 188 mg/dL — ABNORMAL HIGH (ref 70–99)
Glucose-Capillary: 177 mg/dL — ABNORMAL HIGH (ref 70–99)
Glucose-Capillary: 163 mg/dL — ABNORMAL HIGH (ref 70–99)
Glucose-Capillary: 189 mg/dL — ABNORMAL HIGH (ref 70–99)
Glucose-Capillary: 228 mg/dL — ABNORMAL HIGH (ref 70–99)

## 2020-07-24 NOTE — Progress Notes (Signed)
PROGRESS NOTE  Harold Mckee SAY:[id number] DOB: 16-Oct-1946 DOA: 06/29/2020 PCP: Patient, No Pcp Per  HPI/Recap of past 24 hours: Patient was admitted to the hospital with a working diagnosis of bilateral subarachnoid/subdural hemorrhage secondary to left skull fracture due to mechanical fall. Hospitalization complicated by sepsis due to Klebsiella pneumonia, endorgan failure acute hypoxic respiratory failure (not present on admission).  74 year old male who presented after mechanical fall. He does have significant past medical history for hypertension and type 2 diabetes mellitus. He fell from ground-level resulting in head injury while walking his dog in the park. Apparently patient had been drinking alcohol. On his initial physical examination he was bleeding from his left ear, his vital signs showed a blood pressure 134/76, heart rate 94, temperature 97.4, respiratory 22, oxygen saturation 98%. He was altered, unable to follow commands, his lungs are clear to auscultation bilaterally, heart S1-S2, present rhythmic, his abdomen was soft, no lower extremity edema.  CT head and spine showed nondisplaced fracture of the left parietal calvarium along the leftlambdoid suture extending into the left occipital calvarium and left temporal bone with involvement of the left mastoid air cell and external auditory canal. Bilateral subdural and subarachnoid hemorrhages. No midline shift. Focal hemorrhage with small pockets of pneumocephalus in the left posterior temporal lobe along the calvarial fracture.  Patient was admitted to the neuro intensive care unit, he had frequent neuro checks and received.  Patient was evaluated by ENT, no intervention recommended at this point.  On July 14 patient developed fever and worsening leukocytosis, lactic 5.1,he was started onshort course ofempiric antibiotic therapy with cefepimeand vancomycin for 7 days. He continued to have persistent  encephalopathy.  July 15 he had progressive worsening mental status, and was emergently intubated and placed on mechanical ventilation.  He was placed on pressure support ventilation, with good toleration but he was unable to protect his airway due to his persistent encephalopathy. Further work-up with electroencephalography wasnegative for seizures.  Patient underwent percutaneous tracheostomy on July24.Patient had a persistent leukocytosis and significant sputum production. He was diagnosed with Klebsiella pneumonia on July 25th and placed on intravenous ceftriaxone.  Patient continue to failed spontaneous breathing trials.  PMT had goals of care discussion with family 8/3 and plan is to transition to comfort care on Monday to give time for other family members.  No escalation of treatment moving forward til then per family request.  07/24/20: Seen and examined. No significant changes from previous exam.  Family planning to visit over the week-end.   Assessment/Plan: Principal Problem:   Subarachnoid hemorrhage following injury ([place]) Active Problems:   Encephalopathy acute   Tachypnea   Traumatic hemorrhage of cerebrum with loss of consciousness of 30 minutes or less (HCC)   Acute respiratory failure with hypoxia (HCC)   Status post tracheostomy ([place])   Pressure injury of skin   Palliative care by specialist   Goals of care, counseling/discussion  1. Traumatic left skull fracture with bilateral subarachnoid and subdural hematoma. Notresponsive or interactive.Noclinical signs of active seizures.  He is not able to tolerate SBT due to rapid increase work of breathing. He has been maintained on mandatory mode of mechanical ventilation.   Trach care and aspiration precautions. Patient with very poor prognosis.   On keppra per tube.  2. Acute hypoxic respiratory failure due to ventilator associated pneumonia, complicated with sepsis not present on admission. Personally  reviewed chest film from 07.23 with positive infiltrate at the right lower lobe.Sputum culture positive for klebsiella resistant  to ampicillin and sulbactam.  Fi02 is 30% with oxygen saturation 100 %.  PRVC Fi02 30%, Peep 5, RR 16, Vt 630 cc.  Completed antibiotic therapy.   Patient with ventilator dependent respiratory failure. Poor prognosis.   3. Temporal bone fracture. Continue supportive medical therapy, eventually will need outpatient follow up with ENT.  Seen by neurosurgery.  4. Uncontrolled T2DM Hg A1c 8,5.  Serum glucose today 99.  Cut down Lantus 15 units twice daily, cut down NovoLog 4 units every 4 hours Sensitive insulin sliding scale.  5. Alcohol intoxication. Noclinical signs of withdrawal.   6. Resolved Hypernatremia/ hypokalemia. Sodium trending down, today at 145> 142 with K at 4.2> 4.3 and serum bicarbonate at 26,   Free water flushes per peg tube at250 cc q 3 H.Follow renal panel in am.   7. HTN.continue withmetoprolol and amlodipinefor blood pressure control    Status is: Inpatient  Remains inpatient appropriate because:Inpatient level of care appropriate due to severity of illness   Dispo: The patient is from: Home  Anticipated d/c is to: Likely hospital death afer removal of ventilator support on 07/26/20 requested by family.  Anticipated d/c date is: 07/26/20.  Patient currently is not medically stable to d/c.   DVT prophylaxis:      scd   Code Status:              DNR as of 07/20/20 per family request. Family Communication:       No family at the bedside      Nutrition Status: Nutrition Problem: Inadequate oral intake Etiology: inability to eat Signs/Symptoms: NPO status Interventions: MVI, Prostat, Tube feeding     Skin Documentation:   Consultants:   ENT   Neurosurgery   Procedures:   Trach   Peg            Objective: Vitals:   07/24/20 1000 07/24/20  1100 07/24/20 1200 07/24/20 1354  BP: 109/67 107/65  113/70  Pulse: 82 82  88  Resp: '17 16  16  ' Temp:   98.9 F (37.2 C)   TempSrc:   Axillary   SpO2: 100% 100%  100%  Weight:      Height:        Intake/Output Summary (Last 24 hours) at 07/24/2020 1358 Last data filed at 07/24/2020 1230 Gross per 24 hour  Intake 2385 ml  Output 1850 ml  Net 535 ml   Filed Weights   07/20/20 0500 07/21/20 0500 07/22/20 0500  Weight: 73 kg 73 kg 71.7 kg    Exam: No changes from previous exam.  . General: 74 y.o. year-old male well-developed well-nourished no acute stress.  Eyes open. Responds to painful stimuli.  Does not follow commands. . Cardiovascular: Tachycardic no rubs or gallops. Marland Kitchen Respiratory: Clear to auscultation no wheezes no rales.   . Abdomen: Soft nontender normal bowel sounds present.  PEG tube in place.  . Musculoskeletal: Trace lower extremity edema bilaterally.   Marland Kitchen Psychiatry: Unable to assess monitor altered mental status.  Data Reviewed: CBC: No results for input(s): WBC, NEUTROABS, HGB, HCT, MCV, PLT in the last 168 hours. Basic Metabolic Panel: Recent Labs  Lab 07/18/20 0419 07/19/20 0632  NA 142 142  K 4.3 4.3  CL 109 106  CO2 24 26  GLUCOSE 108* 99  BUN 30* 26*  CREATININE 0.59* 0.56*  CALCIUM 9.5 9.2   GFR: Estimated Creatinine Clearance: 80.9 mL/min (A) (by C-G formula based on SCr of 0.56 mg/dL (L)). Liver Function  Tests: No results for input(s): AST, ALT, ALKPHOS, BILITOT, PROT, ALBUMIN in the last 168 hours. No results for input(s): LIPASE, AMYLASE in the last 168 hours. No results for input(s): AMMONIA in the last 168 hours. Coagulation Profile: No results for input(s): INR, PROTIME in the last 168 hours. Cardiac Enzymes: No results for input(s): CKTOTAL, CKMB, CKMBINDEX, TROPONINI in the last 168 hours. BNP (last 3 results) No results for input(s): PROBNP in the last 8760 hours. HbA1C: No results for input(s): HGBA1C in the last 72  hours. CBG: Recent Labs  Lab 07/23/20 0738 07/23/20 2304 07/24/20 0313 07/24/20 0731 07/24/20 1135  GLUCAP 160* 214* 181* 188* 177*   Lipid Profile: No results for input(s): CHOL, HDL, LDLCALC, TRIG, CHOLHDL, LDLDIRECT in the last 72 hours. Thyroid Function Tests: No results for input(s): TSH, T4TOTAL, FREET4, T3FREE, THYROIDAB in the last 72 hours. Anemia Panel: No results for input(s): VITAMINB12, FOLATE, FERRITIN, TIBC, IRON, RETICCTPCT in the last 72 hours. Urine analysis:    Component Value Date/Time   COLORURINE YELLOW 06/30/2020 1650   APPEARANCEUR CLEAR 06/30/2020 1650   LABSPEC 1.018 06/30/2020 1650   PHURINE 5.0 06/30/2020 1650   GLUCOSEU >=500 (A) 06/30/2020 1650   HGBUR SMALL (A) 06/30/2020 1650   BILIRUBINUR NEGATIVE 06/30/2020 1650   KETONESUR 5 (A) 06/30/2020 1650   PROTEINUR NEGATIVE 06/30/2020 1650   NITRITE NEGATIVE 06/30/2020 1650   LEUKOCYTESUR NEGATIVE 06/30/2020 1650   Sepsis Labs: '@LABRCNTIP' (procalcitonin:4,lacticidven:4)  ) No results found for this or any previous visit (from the past 240 hour(s)).    Studies: No results found.  Scheduled Meds: . amLODipine  10 mg Per Tube Daily  . chlorhexidine gluconate (MEDLINE KIT)  15 mL Mouth Rinse BID  . Chlorhexidine Gluconate Cloth  6 each Topical Q0600  . docusate  100 mg Per Tube BID  . feeding supplement (PROSource TF)  90 mL Per Tube BID  . free water  250 mL Per Tube Q6H  . heparin  5,000 Units Subcutaneous Q8H  . insulin aspart  0-9 Units Subcutaneous Q4H  . insulin detemir  7 Units Subcutaneous BID  . levETIRAcetam  1,000 mg Per Tube BID  . mouth rinse  15 mL Mouth Rinse 10 times per day  . metoprolol tartrate  50 mg Per Tube BID  . multivitamin with minerals  1 tablet Per Tube Daily  . pantoprazole sodium  40 mg Per Tube QHS  . polyethylene glycol  17 g Per Tube BID  . Semaglutide(0.25 or 0.5MG/DOS)  0.5067 mg Subcutaneous Q Wed  . sennosides  5 mL Per Tube BID    Continuous  Infusions: . sodium chloride Stopped (07/19/20 1448)  . feeding supplement (GLUCERNA 1.2 CAL) 65 mL/hr at 07/24/20 1200     LOS: 25 days     Kayleen Memos, MD [facility] Pager [phone]  If 7PM-7AM, please contact night-coverage [email or web address] Password [email or web address] 07/24/2020, 1:58 PM

## 2020-07-24 NOTE — Progress Notes (Signed)
   Providing Compassionate, Quality Care - Together  NEUROSURGERY PROGRESS NOTE   S: No issues overnight.   O: EXAM:  BP 131/74   Pulse (!) 110   Temp 99.3 F (37.4 C) (Axillary)   Resp 18   Ht 6\' 1"  (1.854 m)   Wt 71.7 kg   SpO2 100%   BMI 20.85 kg/m   Eyesclosed, open to voice PERRL Trach in place Min movement in BUE WD to pain in BLE [place] to pain  ASSESSMENT:  74 y.o. male with  1. Left occipital parietotemporal calvarial fracture secondary to fall 2.Left temporal contusion 3.Bilateral frontal traumatic subarachnoidhemorrhage & contusions/subdural hematomaswith cerebral edema 4.TBI, severe 5. HCAP  PLAN: -neuro ICU -Q4hr neurochecks -Keppra 1000 mg twice daily(EEG negx2) -s/p PEG -Blood pressure control -ok for SQH -CT brain stable 7/20 -s/p trach -palliative on board, planned withdrawal onMonday, family visiting this weekend    Please do not hesitate to call with questions or concerns.   11-22-1978, DO Neurosurgeon [facility] Cell: [phone]

## 2020-07-25 LAB — GLUCOSE, CAPILLARY
Glucose-Capillary: 156 mg/dL — ABNORMAL HIGH (ref 70–99)
Glucose-Capillary: 163 mg/dL — ABNORMAL HIGH (ref 70–99)
Glucose-Capillary: 170 mg/dL — ABNORMAL HIGH (ref 70–99)
Glucose-Capillary: 170 mg/dL — ABNORMAL HIGH (ref 70–99)
Glucose-Capillary: 180 mg/dL — ABNORMAL HIGH (ref 70–99)
Glucose-Capillary: 199 mg/dL — ABNORMAL HIGH (ref 70–99)
Glucose-Capillary: 199 mg/dL — ABNORMAL HIGH (ref 70–99)
Glucose-Capillary: 201 mg/dL — ABNORMAL HIGH (ref 70–99)
Glucose-Capillary: 203 mg/dL — ABNORMAL HIGH (ref 70–99)
Glucose-Capillary: 225 mg/dL — ABNORMAL HIGH (ref 70–99)

## 2020-07-25 MED ORDER — BIOTENE DRY MOUTH MT LIQD: 15.0000 mL | OROMUCOSAL | Status: DC | PRN

## 2020-07-25 MED ORDER — LORAZEPAM 2 MG/ML PO CONC: 1.0000 mg | ORAL | Status: DC | PRN

## 2020-07-25 MED ORDER — LORAZEPAM 2 MG/ML IJ SOLN: 1.0000 mg | INTRAMUSCULAR | Status: DC | PRN

## 2020-07-25 MED ORDER — GLYCOPYRROLATE 1 MG PO TABS: 1.0000 mg | ORAL_TABLET | ORAL | Status: DC | PRN

## 2020-07-25 MED ORDER — GLYCOPYRROLATE 0.2 MG/ML IJ SOLN: 0.2000 mg | INTRAMUSCULAR | Status: DC | PRN

## 2020-07-25 MED ORDER — POLYVINYL ALCOHOL 1.4 % OP SOLN
1.0000 [drp] | Freq: Four times a day (QID) | OPHTHALMIC | Status: DC | PRN
  Filled 2020-07-25: qty 15

## 2020-07-25 NOTE — Progress Notes (Signed)
Daily Progress Note   Patient Name: Harold Mckee       Date: 07/25/2020 DOB: Dec 21, 1945  Age: 74 y.o. MRN#: [id number] Attending Physician: Kayleen Memos, DO Primary Care Physician: Patient, No Pcp Per Admit Date: 06/29/2020  HPI/Patient Profile:74 y.o.malewith past medical history of hypertension and diabetes [facility]after a fall while walking his dog. The fall was witnessed, he fell back and hit his head, afterward had altered mental status so family called EMS. In the ED, labs did reveal positive ETOH. He was noted to have blood in the left ear canal. CT head revealed fracture of the left parietal calvarium, and bilateral subdural and subarachnoid hemorrhages.  His mental status declined to the point of extreme somnolence with questionable airway protection and he required emergent intubation 7/15. Hospitalization has been complicated by sepsis secondary to klebsiella pneumonia.  He is status post tracheostomy and PEG placement. He has remained poorly responsive. Palliative care has been consulted to assist with goals of care.  Subjective: Earlier this morning I coordinated care between patient's family, [facility] nursing staff, and front desk to facilitate visitation for additional family members.  18:45--I spoke with daughter Elmyra Ricks by phone. She shares with me that her mother is having second thoughts about removing ventilator support and requests that I speak with her, so places me on speaker. Patient's wife Jerrel Ivory that it seems patient is looking at her when she is in the room. I provide education that although his eyes are open and move around sometimes, this is not purposeful. I provided reassurance that it is very normal for family to have  doubts when making such an important decision.  We discussed again some of the points from our [place] meeting 8/3. 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 Discussed that he has remained minimally responsive. Also discussed the disposition option of LTAC. We discussed that he would likely be ventilator dependent long-term and that there would be complications including infections and skin break down. We discuss whether he would have quality of life in this state, and Rodena Piety and [place] agree he would not. They verbalize wish to proceed with removal of ventilator support tomorrow morning. I let them know I will proceed with implementing additional comfort measures, including stopping  tube feeding.   Length of Stay: 26  Current Medications: Scheduled Meds:  . amLODipine  10 mg Per Tube Daily  . chlorhexidine gluconate (MEDLINE KIT)  15 mL Mouth Rinse BID  . Chlorhexidine Gluconate Cloth  6 each Topical Q0600  . levETIRAcetam  1,000 mg Per Tube BID  . mouth rinse  15 mL Mouth Rinse 10 times per day  . metoprolol tartrate  50 mg Per Tube BID  . pantoprazole sodium  40 mg Per Tube QHS   PRN Meds: sodium chloride, acetaminophen, acetaminophen, antiseptic oral rinse, bisacodyl, [DISCONTINUED] glycopyrrolate **OR** glycopyrrolate **OR** glycopyrrolate, LORazepam **OR** LORazepam, morphine injection, ondansetron **OR** ondansetron (ZOFRAN) IV, polyvinyl alcohol      Vital Signs: BP 101/75   Pulse 92   Temp (!) 97.5 F (36.4 C) (Axillary)   Resp 16   Ht _0  (1.854 m)   Wt 71.7 kg   SpO2 100%   BMI 20.85 kg/m  SpO2: SpO2: 100 % O2 Device: O2 Device: Ventilator  Palliative Assessment/Data: 10%      Palliative Care Assessment & Plan   Assessment: - TBI, severe - traumatic left skull fracture - bilateral subarachnoid and subdural hemorrhages - acute hypoxic respiratory failure due to ventilator associated pneumonia -  ventilator dependent, status post tracheostomy  Recommendations/Plan: - DNR/DNI - comfort measures - stop tube feeds, CBGs and insulin coverage - minimize medications - plan for removal from vent tomorrow morning - will start morphine infusion prior to removal from vent  Goals of Care and Additional Recommendations:  Limitations on Scope of Treatment: Full Comfort Care  Symptom Management:   Per end of life orders  Morphine prn for pain or dyspnea  Lorazepam (ATIVAN) prn for agitation  Glycopyrrolate (ROBINUL) for excessive secretions  Ondansetron (ZOFRAN) prn for nausea  Prognosis:   poor, hours to days after removal from ventilator  Discharge Planning:  To Be Determined  Care plan was discussed with bedside RN  Thank you for allowing the Palliative Medicine Team to assist in the care of this patient.   Total Time 35 minutes Prolonged Time Billed  no       Greater than 50%  of this time was spent counseling and coordinating care related to the above assessment and plan.  Lavena Bullion, NP  Please contact Palliative Medicine Team phone at [phone] for questions and concerns.

## 2020-07-25 NOTE — Progress Notes (Signed)
   Providing Compassionate, Quality Care - Together  NEUROSURGERY PROGRESS NOTE   S: No issues overnight.   O: EXAM:  BP 129/80   Pulse (!) 109   Temp 98.8 F (37.1 C) (Axillary)   Resp (!) 24   Ht 6\' 1"  (1.854 m)   Wt 71.7 kg   SpO2 100%   BMI 20.85 kg/m   Eyesopen spont PERRL Trach in place Min movement in BUE WD to pain in BLE [place] to pain Tracks BL  ASSESSMENT:  74 y.o. male with  1. Left occipital parietotemporal calvarial fracture secondary to fall 2.Left temporal contusion 3.Bilateral frontal traumatic subarachnoidhemorrhage & contusions/subdural hematomaswith cerebral edema 4.TBI, severe 5. HCAP  PLAN: -neuro ICU -Q4hr neurochecks -Keppra 1000 mg twice daily(EEG negx2) -s/p PEG -Blood pressure control -ok for SQH -CT brain stable 7/20 -s/p trach -palliative on board, planned withdrawal onMonday, family visiting this weekend     Please do not hesitate to call with questions or concerns.   11-22-1978, DO Neurosurgeon [facility] Cell: [phone]

## 2020-07-25 NOTE — Progress Notes (Signed)
PROGRESS NOTE  Harold Mckee MGQ:[id number] DOB: 1946-05-05 DOA: 06/29/2020 PCP: Patient, No Pcp Per  HPI/Recap of past 24 hours: Patient was admitted to the hospital with a working diagnosis of bilateral subarachnoid/subdural hemorrhage secondary to left skull fracture due to mechanical fall. Hospitalization complicated by sepsis due to Klebsiella pneumonia, endorgan failure acute hypoxic respiratory failure (not present on admission).  74 year old male who presented after mechanical fall. He does have significant past medical history for hypertension and type 2 diabetes mellitus. He fell from ground-level resulting in head injury while walking his dog in the park. Apparently patient had been drinking alcohol. On his initial physical examination he was bleeding from his left ear, his vital signs showed a blood pressure 134/76, heart rate 94, temperature 97.4, respiratory 22, oxygen saturation 98%. He was altered, unable to follow commands, his lungs are clear to auscultation bilaterally, heart S1-S2, present rhythmic, his abdomen was soft, no lower extremity edema.  CT head and spine showed nondisplaced fracture of the left parietal calvarium along the leftlambdoid suture extending into the left occipital calvarium and left temporal bone with involvement of the left mastoid air cell and external auditory canal. Bilateral subdural and subarachnoid hemorrhages. No midline shift. Focal hemorrhage with small pockets of pneumocephalus in the left posterior temporal lobe along the calvarial fracture.  Patient was admitted to the neuro intensive care unit, he had frequent neuro checks and received.  Patient was evaluated by ENT, no intervention recommended at this point.  On July 14 patient developed fever and worsening leukocytosis, lactic 5.1,he was started onshort course ofempiric antibiotic therapy with cefepimeand vancomycin for 7 days. He continued to have persistent  encephalopathy.  July 15 he had progressive worsening mental status, and was emergently intubated and placed on mechanical ventilation.  He was placed on pressure support ventilation, with good toleration but he was unable to protect his airway due to his persistent encephalopathy. Further work-up with electroencephalography wasnegative for seizures.  Patient underwent percutaneous tracheostomy on July24.Patient had a persistent leukocytosis and significant sputum production. He was diagnosed with Klebsiella pneumonia on July 25th and placed on intravenous ceftriaxone.  Patient continue to failed spontaneous breathing trials.  PMT had goals of care discussion with family 8/3 and plan is to transition to comfort care on Monday to give time for other family members.  No escalation of treatment moving forward til then per family request.  07/24/20: Seen and examined.  Does not track with his eyes.  No significant changes from previous exam.  Family planning to visit over the week-end.   Assessment/Plan: Principal Problem:   Subarachnoid hemorrhage following injury ([place]) Active Problems:   Encephalopathy acute   Tachypnea   Traumatic hemorrhage of cerebrum with loss of consciousness of 30 minutes or less (HCC)   Acute respiratory failure with hypoxia (HCC)   Status post tracheostomy ([place])   Pressure injury of skin   Palliative care by specialist   Goals of care, counseling/discussion  1. Traumatic left skull fracture with bilateral subarachnoid and subdural hematoma. Notresponsive or interactive.Noclinical signs of active seizures.  He is not able to tolerate SBT due to rapid increase work of breathing. He has been maintained on mandatory mode of mechanical ventilation.   Trach care and aspiration precautions. Patient with very poor prognosis.   On keppra per tube.  2. Acute hypoxic respiratory failure due to ventilator associated pneumonia, complicated with sepsis not  present on admission. Personally reviewed chest film from 07.23 with positive infiltrate at the  right lower lobe.Sputum culture positive for klebsiella resistant to ampicillin and sulbactam.  Fi02 is 30% with oxygen saturation 100 %.  PRVC Fi02 30%, Peep 5, RR 16, Vt 630 cc.  Completed antibiotic therapy.   Patient with ventilator dependent respiratory failure. Poor prognosis.   3. Temporal bone fracture. Continue supportive medical therapy, eventually will need outpatient follow up with ENT.  Seen by neurosurgery.  4. Uncontrolled T2DM Hg A1c 8,5.  Serum glucose today 99.  Cut down Lantus 15 units twice daily, cut down NovoLog 4 units every 4 hours Sensitive insulin sliding scale.  5. Alcohol intoxication. Noclinical signs of withdrawal.   6. Resolved Hypernatremia/ hypokalemia. Sodium trending down, today at 145> 142 with K at 4.2> 4.3 and serum bicarbonate at 26,   Free water flushes per peg tube at250 cc q 3 H.Follow renal panel in am.   7. HTN.continue withmetoprolol and amlodipinefor blood pressure control    Status is: Inpatient  Remains inpatient appropriate because:Inpatient level of care appropriate due to severity of illness   Dispo: The patient is from: Home  Anticipated d/c is to: Likely hospital death afer removal of ventilator support on 07/26/20 requested by family.  Anticipated d/c date is: 07/26/20.  Patient currently is not medically stable to d/c.   DVT prophylaxis:      scd   Code Status:              DNR as of 07/20/20 per family request. Family Communication:       No family at the bedside      Nutrition Status: Nutrition Problem: Inadequate oral intake Etiology: inability to eat Signs/Symptoms: NPO status Interventions: MVI, Prostat, Tube feeding     Skin Documentation:   Consultants:   ENT   Neurosurgery   Procedures:   Trach   Peg             Objective: Vitals:   07/25/20 1200 07/25/20 1300 07/25/20 1359 07/25/20 1400  BP: 120/72 101/71 101/71 108/72  Pulse: 86 94 93 94  Resp: _0 Temp: 99.5 F (37.5 C)     TempSrc: Axillary     SpO2: 100% 100% 100% 100%  Weight:      Height:        Intake/Output Summary (Last 24 hours) at 07/25/2020 1413 Last data filed at 07/25/2020 1400 Gross per 24 hour  Intake 2560 ml  Output 1500 ml  Net 1060 ml   Filed Weights   07/20/20 0500 07/21/20 0500 07/22/20 0500  Weight: 73 kg 73 kg 71.7 kg    Exam: No changes from previous exam.  . General: 74 y.o. year-old male well-developed well-nourished no acute stress.  Eyes open. Responds to painful stimuli.  Does not follow commands.  Does not track with his eyes. . Cardiovascular: Tachycardic no rubs or gallops. Marland Kitchen Respiratory: Clear to auscultation no wheezes no rales.   . Abdomen: Soft nontender normal bowel sounds present.  PEG tube in place.  . Musculoskeletal: Trace lower extremity edema bilaterally.   Marland Kitchen Psychiatry: Unable to assess monitor altered mental status.  Data Reviewed: CBC: No results for input(s): WBC, NEUTROABS, HGB, HCT, MCV, PLT in the last 168 hours. Basic Metabolic Panel: Recent Labs  Lab 07/19/20 0632  NA 142  K 4.3  CL 106  CO2 26  GLUCOSE 99  BUN 26*  CREATININE 0.56*  CALCIUM 9.2   GFR: Estimated Creatinine Clearance: 80.9 mL/min (A) (by C-G formula based on SCr of  0.56 mg/dL (L)). Liver Function Tests: No results for input(s): AST, ALT, ALKPHOS, BILITOT, PROT, ALBUMIN in the last 168 hours. No results for input(s): LIPASE, AMYLASE in the last 168 hours. No results for input(s): AMMONIA in the last 168 hours. Coagulation Profile: No results for input(s): INR, PROTIME in the last 168 hours. Cardiac Enzymes: No results for input(s): CKTOTAL, CKMB, CKMBINDEX, TROPONINI in the last 168 hours. BNP (last 3 results) No results for input(s): PROBNP in the last 8760  hours. HbA1C: No results for input(s): HGBA1C in the last 72 hours. CBG: Recent Labs  Lab 07/24/20 1920 07/24/20 2312 07/25/20 0312 07/25/20 0723 07/25/20 1136  GLUCAP 189* 228* 201* 170* 203*   Lipid Profile: No results for input(s): CHOL, HDL, LDLCALC, TRIG, CHOLHDL, LDLDIRECT in the last 72 hours. Thyroid Function Tests: No results for input(s): TSH, T4TOTAL, FREET4, T3FREE, THYROIDAB in the last 72 hours. Anemia Panel: No results for input(s): VITAMINB12, FOLATE, FERRITIN, TIBC, IRON, RETICCTPCT in the last 72 hours. Urine analysis:    Component Value Date/Time   COLORURINE YELLOW 06/30/2020 1650   APPEARANCEUR CLEAR 06/30/2020 1650   LABSPEC 1.018 06/30/2020 1650   PHURINE 5.0 06/30/2020 1650   GLUCOSEU >=500 (A) 06/30/2020 1650   HGBUR SMALL (A) 06/30/2020 1650   BILIRUBINUR NEGATIVE 06/30/2020 1650   KETONESUR 5 (A) 06/30/2020 1650   PROTEINUR NEGATIVE 06/30/2020 1650   NITRITE NEGATIVE 06/30/2020 1650   LEUKOCYTESUR NEGATIVE 06/30/2020 1650   Sepsis Labs: _0 (procalcitonin:4,lacticidven:4)  ) No results found for this or any previous visit (from the past 240 hour(s)).    Studies: No results found.  Scheduled Meds: . amLODipine  10 mg Per Tube Daily  . chlorhexidine gluconate (MEDLINE KIT)  15 mL Mouth Rinse BID  . Chlorhexidine Gluconate Cloth  6 each Topical Q0600  . docusate  100 mg Per Tube BID  . feeding supplement (PROSource TF)  90 mL Per Tube BID  . free water  250 mL Per Tube Q6H  . heparin  5,000 Units Subcutaneous Q8H  . insulin aspart  0-9 Units Subcutaneous Q4H  . insulin detemir  7 Units Subcutaneous BID  . levETIRAcetam  1,000 mg Per Tube BID  . mouth rinse  15 mL Mouth Rinse 10 times per day  . metoprolol tartrate  50 mg Per Tube BID  . multivitamin with minerals  1 tablet Per Tube Daily  . pantoprazole sodium  40 mg Per Tube QHS  . polyethylene glycol  17 g Per Tube BID  . Semaglutide(0.25 or 0.5MG/DOS)  0.5067 mg Subcutaneous  Q Wed  . sennosides  5 mL Per Tube BID    Continuous Infusions: . sodium chloride Stopped (07/19/20 1448)  . feeding supplement (GLUCERNA 1.2 CAL) 1,000 mL (07/25/20 0100)     LOS: 26 days     Kayleen Memos, MD [facility] Pager [phone]  If 7PM-7AM, please contact night-coverage [email or web address] Password [email or web address] 07/25/2020, 2:13 PM

## 2020-07-26 ENCOUNTER — Other Ambulatory Visit: Payer: Self-pay

## 2020-07-26 ENCOUNTER — Ambulatory Visit (INDEPENDENT_AMBULATORY_CARE_PROVIDER_SITE_OTHER): Payer: Medicare HMO

## 2020-07-26 ENCOUNTER — Telehealth: Payer: Self-pay

## 2020-07-26 ENCOUNTER — Ambulatory Visit: Payer: Medicare HMO | Admitting: Orthopedic Surgery

## 2020-07-26 VITALS — Ht 68.0 in | Wt 203.0 lb

## 2020-07-26 DIAGNOSIS — M25551 Pain in right hip: Secondary | ICD-10-CM

## 2020-07-26 LAB — GLUCOSE, CAPILLARY
Glucose-Capillary: 187 mg/dL — ABNORMAL HIGH (ref 70–99)
Glucose-Capillary: 166 mg/dL — ABNORMAL HIGH (ref 70–99)

## 2020-07-26 MED ORDER — MORPHINE 100MG IN NS 100ML (1MG/ML) PREMIX INFUSION
2.0000 mg/h | INTRAVENOUS | Status: DC
  Administered 2020-07-26: 2 mg/h via INTRAVENOUS
  Administered 2020-07-27: 4 mg/h via INTRAVENOUS
  Administered 2020-07-28: 6 mg/h via INTRAVENOUS
  Filled 2020-07-26 (×3): qty 100

## 2020-07-26 MED ORDER — MORPHINE BOLUS VIA INFUSION
2.0000 mg | INTRAVENOUS | Status: DC | PRN
  Filled 2020-07-26: qty 2

## 2020-07-26 NOTE — Progress Notes (Signed)
Patient taken off the vent and on room air per comfort care measures.

## 2020-07-26 NOTE — Progress Notes (Signed)
Daily Progress Note   Patient Name: Harold Mckee       Date: 07/26/2020 DOB: Feb 14, 1946  Age: 74 y.o. MRN#: [id number] Attending Physician: Kayleen Memos, DO Primary Care Physician: Patient, No Pcp Per Admit Date: 06/29/2020  HPI/Patient Profile:74 y.o.malewith past medical history of hypertension and diabetes [facility] a [facility] while walking his dog. The fall was witnessed, he fell back and hit his head, afterward had altered mental status so family called EMS. In the ED, labs did reveal positive ETOH. He was noted to have blood in the left ear canal. CT head revealed fracture of the left parietal calvarium, and bilateral subdural and subarachnoid hemorrhages.  His mental status declined to the point of extreme somnolence with questionable airway protection and he required emergent intubation 7/15. Hospitalization has been complicated by sepsis secondary to klebsiella pneumonia.  He is status post tracheostomy and PEG placement. He has remained poorly responsive. Palliative care has been consulted to assist with goals of care.  Subjective: 10:00--I met with patient's family in the [place] conference room. Wife/Harold Mckee seems to be struggling with the decision to remove patient from the ventilator. She requests to re-visit the discussion from our initial family meeting on 8/3.  I reviewed his diagnosis and prognosis again--that he has severe traumatic brain injury, resulting in dependence on ventilator support and artificial feeding. Discussed that he has remained minimally responsive. Provided education that his eye movement is not purposeful. Discussed that it is normal for family members to want to see purposeful interaction in their loved one, but  unfortunately he is minimally responsive.  Harold Mckee also requests that we review the option of LTAC. I review that he would likely be ventilator dependent long-term. I provide education that there would be complications including infections, such as pneumonia, and skin break down. We discussed that LTAC would allow patient to remain in his earthly body for longer, but that he would have poor quality of life. Discussed the difference between "existing" and "living". Harold Mckee and other family members collectively agree that the patient would not have good quality of life dependent on a ventilator and would not want to remain in this condition.   The family requests time to pray. They are tearful and lamenting the choice they face. We discussed that is a difficult  but compassionate decision to remove someone from ventilator support, when the condition is irreversible. Discussed that removing him from ventilator support is not causing him to pass, but allowing for natural death to occur that would have occurred earlier due to his brain injury.    11:15--Family states they are ready to proceed with removal of ventilator. I communicated this to bedside RN and RT. Morphine infusion already started and patient appears comfortable.   18:50--Patient continues to appear comfortable. I spoke with daughter Harold Mckee by phone to discuss transferring him out of ICU, preferably to 6N. We also discuss option of transitioning to residential hospice, and Harold Mckee would like to proceed with this referral.   Length of Stay: 27  Current Medications:  Continuous Infusions: . morphine 3 mg/hr (07/26/20 1900)    PRN Meds: glycopyrrolate, LORazepam, morphine, ondansetron (ZOFRAN) IV, polyvinyl alcohol  Physical Exam       Neuro: Unresponsive to voice and light touch Pulmonary: Respirations even and unlabored Cardiac: regular rate           Vital Signs: BP 105/77   Pulse 98   Temp 98.2 F (36.8 C) (Axillary)   Resp 16    Ht '6\' 1"'  (1.854 m)   Wt 70.2 kg   SpO2 92%   BMI 20.42 kg/m  SpO2: SpO2: 92 % O2 Device: O2 Device: Room Air  Palliative Assessment/Data: 10%     Palliative Care Assessment & Plan   Assessment: - TBI, severe - traumatic left skull fracture - bilateral subarachnoid and subdural hemorrhages - acute hypoxic respiratory failure due to ventilator associated pneumonia - ventilator dependent, status post tracheostomy  Recommendations/Plan: - DNR/DNI - removal from ventilator support - comfort measures - continue morphine infusion, titrate for comfort - TOC order for residential hospice referral - may transfer to 6N in the meantime if ok with attending  Code Status: DNR  Prognosis:   hours to days  Discharge Planning:  Residential hospice  Care plan was discussed with bedside RN and RT  Thank you for allowing the Palliative Medicine Team to assist in the care of this patient.   Total Time 75 minutes Prolonged Time Billed  yes       Greater than 50%  of this time was spent counseling and coordinating care related to the above assessment and plan.  Lavena Bullion, NP  Please contact Palliative Medicine Team phone at [phone] for questions and concerns.

## 2020-07-26 NOTE — Progress Notes (Signed)
Pt for palliative discontinuation of ventilator support this morning. CCM will sign off.  Please call with any further questions.

## 2020-07-26 NOTE — Progress Notes (Signed)
Nutrition Brief Note  Chart reviewed. Pt now transitioning to comfort care.  No further nutrition interventions warranted at this time.  Please re-consult as needed.   Zaryah Seckel P., RD, LDN, CNSC See AMiON for contact information     

## 2020-07-26 NOTE — Progress Notes (Signed)
PROGRESS NOTE  Harold Mckee UKG:[id number] DOB: 19-Aug-1946 DOA: 06/29/2020 PCP: Patient, No Pcp Per  HPI/Recap of past 24 hours: Patient was admitted to the hospital with a working diagnosis of bilateral subarachnoid/subdural hemorrhage secondary to left skull fracture due to mechanical fall. Hospitalization complicated by sepsis due to Klebsiella pneumonia, endorgan failure acute hypoxic respiratory failure (not present on admission).  74 year old male who presented after mechanical fall. He does have significant past medical history for hypertension and type 2 diabetes mellitus. He fell from ground-level resulting in head injury while walking his dog in the park. Apparently patient had been drinking alcohol. On his initial physical examination he was bleeding from his left ear, his vital signs showed a blood pressure 134/76, heart rate 94, temperature 97.4, respiratory 22, oxygen saturation 98%. He was altered, unable to follow commands, his lungs are clear to auscultation bilaterally, heart S1-S2, present rhythmic, his abdomen was soft, no lower extremity edema.  CT head and spine showed nondisplaced fracture of the left parietal calvarium along the leftlambdoid suture extending into the left occipital calvarium and left temporal bone with involvement of the left mastoid air cell and external auditory canal. Bilateral subdural and subarachnoid hemorrhages. No midline shift. Focal hemorrhage with small pockets of pneumocephalus in the left posterior temporal lobe along the calvarial fracture.  Patient was admitted to the neuro intensive care unit, he had frequent neuro checks and received.  Patient was evaluated by ENT, no intervention recommended at this point.  On July 14 patient developed fever and worsening leukocytosis, lactic 5.1,he was started onshort course ofempiric antibiotic therapy with cefepimeand vancomycin for 7 days. He continued to have persistent  encephalopathy.  July 15 he had progressive worsening mental status, and was emergently intubated and placed on mechanical ventilation.  He was placed on pressure support ventilation, with good toleration but he was unable to protect his airway due to his persistent encephalopathy. Further work-up with electroencephalography wasnegative for seizures.  Patient underwent percutaneous tracheostomy on July24.Patient had a persistent leukocytosis and significant sputum production. He was diagnosed with Klebsiella pneumonia on July 25th and placed on intravenous ceftriaxone.  Patient continue to failed spontaneous breathing trials.  PMT had goals of care discussion with family 8/3 and plan is to transition to comfort care on Monday to give time for other family members.  No escalation of treatment moving forward til then per family request.  On 07/26/20 family made decision for comfort measures only.  Patient was taken off the vent.   Assessment/Plan: Principal Problem:   Subarachnoid hemorrhage following injury (HCC) Active Problems:   Encephalopathy acute   Tachypnea   Traumatic hemorrhage of cerebrum with loss of consciousness of 30 minutes or less (HCC)   Acute respiratory failure with hypoxia (HCC)   Status post tracheostomy (HCC)   Pressure injury of skin   Palliative care by specialist   Goals of care, counseling/discussion  Assessment: -Traumatic left skull fracture with bilateral subarachnoid and subdural hematoma. -Acute hypoxic respiratory failure due to ventilator associated pneumonia, complicated with sepsis not present on admission.  -Temporal bone fracture.  -Uncontrolled T2DM Hg A1c 8,5.  -Alcohol intoxication.  -Resolved Hypernatremia/ hypokalemia. -HTN.  Plan: Comfort care orders in place.    Status is: Inpatient  Dispo: The patient is from: Home  Anticipated d/c is to: Likely hospital death   Anticipated d/c date is:  07/26/20.  Patient currently is not medically stable to d/c.   DVT prophylaxis:      Full comfort  care only. Code Status:              DNR as of 07/20/20 and comfort care on 07/26/20 Family Communication:       No family at the bedside       Consultants:   ENT   Neurosurgery  Palliative care team   Procedures:   Trach placement   Peg tube placement   Objective: Vitals:   07/26/20 0900 07/26/20 1127 07/26/20 1200 07/26/20 1300  BP: 105/73  114/71 118/69  Pulse: 97 (!) 105 (!) 102 99  Resp: 16 (!) 30 (!) 23 (!) 21  Temp:      TempSrc:      SpO2: 100% 99% 97% 98%  Weight:      Height:        Intake/Output Summary (Last 24 hours) at 07/26/2020 1424 Last data filed at 07/26/2020 1300 Gross per 24 hour  Intake 903.69 ml  Output 950 ml  Net -46.31 ml   Filed Weights   07/21/20 0500 07/22/20 0500 07/26/20 0500  Weight: 73 kg 71.7 kg 70.2 kg    Exam:  . General: 74 y.o. year-old male well-developed well-nourished no acute stress. Does not follow commands.  Does not track with his eyes. . Cardiovascular: Regular rate and rythm no rubs or gallops. Marland Kitchen Respiratory: Trach in place . Abdomen: PEG tube in place.  . Musculoskeletal: Trace lower extremity edema bilaterally.   Marland Kitchen Psychiatry: Unable to assess monitor altered mental status.  Data Reviewed: CBC: No results for input(s): WBC, NEUTROABS, HGB, HCT, MCV, PLT in the last 168 hours. Basic Metabolic Panel: No results for input(s): NA, K, CL, CO2, GLUCOSE, BUN, CREATININE, CALCIUM, MG, PHOS in the last 168 hours. GFR: Estimated Creatinine Clearance: 79.2 mL/min (A) (by C-G formula based on SCr of 0.56 mg/dL (L)). Liver Function Tests: No results for input(s): AST, ALT, ALKPHOS, BILITOT, PROT, ALBUMIN in the last 168 hours. No results for input(s): LIPASE, AMYLASE in the last 168 hours. No results for input(s): AMMONIA in the last 168 hours. Coagulation Profile: No results for input(s): INR, PROTIME  in the last 168 hours. Cardiac Enzymes: No results for input(s): CKTOTAL, CKMB, CKMBINDEX, TROPONINI in the last 168 hours. BNP (last 3 results) No results for input(s): PROBNP in the last 8760 hours. HbA1C: No results for input(s): HGBA1C in the last 72 hours. CBG: Recent Labs  Lab 07/25/20 1512 07/25/20 1940 07/25/20 2335 07/26/20 0323 07/26/20 0804  GLUCAP 180* 199* 225* 187* 166*   Lipid Profile: No results for input(s): CHOL, HDL, LDLCALC, TRIG, CHOLHDL, LDLDIRECT in the last 72 hours. Thyroid Function Tests: No results for input(s): TSH, T4TOTAL, FREET4, T3FREE, THYROIDAB in the last 72 hours. Anemia Panel: No results for input(s): VITAMINB12, FOLATE, FERRITIN, TIBC, IRON, RETICCTPCT in the last 72 hours. Urine analysis:    Component Value Date/Time   COLORURINE YELLOW 06/30/2020 1650   APPEARANCEUR CLEAR 06/30/2020 1650   LABSPEC 1.018 06/30/2020 1650   PHURINE 5.0 06/30/2020 1650   GLUCOSEU >=500 (A) 06/30/2020 1650   HGBUR SMALL (A) 06/30/2020 1650   BILIRUBINUR NEGATIVE 06/30/2020 1650   KETONESUR 5 (A) 06/30/2020 1650   PROTEINUR NEGATIVE 06/30/2020 1650   NITRITE NEGATIVE 06/30/2020 1650   LEUKOCYTESUR NEGATIVE 06/30/2020 1650   Sepsis Labs: @LABRCNTIP (procalcitonin:4,lacticidven:4)  ) No results found for this or any previous visit (from the past 240 hour(s)).    Studies: No results found.  Scheduled Meds:   Continuous Infusions: . morphine 2 mg/hr (07/26/20 1300)  LOS: 27 days     Darlin Drop, MD [facility] Pager [phone]  If 7PM-7AM, please contact night-coverage [email or web address] Password [email or web address] 07/26/2020, 2:24 PM

## 2020-07-26 NOTE — Progress Notes (Signed)
   Providing Compassionate, Quality Care - Together  NEUROSURGERY PROGRESS NOTE   S: No issues overnight. Will be transitioned to comfort care later today  O: EXAM:  BP 118/69   Pulse 99   Temp 98.2 F (36.8 C) (Axillary)   Resp (!) 21   Ht 6\' 1"  (1.854 m)   Wt 70.2 kg   SpO2 98%   BMI 20.42 kg/m  Eyesopen spont PERRL Trach in place Min movement in BUE WD to pain in BLE [place] to pain Tracks BL   ASSESSMENT:  74 y.o. male with  1. Left occipital parietotemporal calvarial fracture secondary to fall 2.Left temporal contusion 3.Bilateral frontal traumatic subarachnoidhemorrhage & contusions/subdural hematomaswith cerebral edema 4.TBI, severe 5. HCAP   PLAN: -transition to comfort care per palliative   Please do not hesitate to call with questions or concerns.   61, DO Neurosurgeon [facility] Cell: [phone]

## 2020-07-26 NOTE — Progress Notes (Signed)
Speech Language Pathology Discharge Patient Details Name: Harold Mckee MRN: [id number] DOB: 02-Sep-1946 Today's Date: 07/26/2020 Time:  -     Patient discharged from SLP services secondary to medical decline - will need to re-order SLP to resume therapy services. Withdrawing support today  Please see latest therapy progress note for current level of functioning and progress toward goals.    Progress and discharge plan discussed with patient and/or caregiver: Patient unable to participate in discharge planning and no caregivers available  GO     Royce Macadamia 07/26/2020, 8:40 AM

## 2020-07-26 NOTE — Telephone Encounter (Signed)
Can you please see if we can get patient worked in with Dr Junius Roads this week per Dr Forbes Cellar request for a right hip injection? Thanks.

## 2020-07-27 NOTE — Progress Notes (Signed)
PROGRESS NOTE  Harold Mckee DGL:[id number] DOB: Dec 04, 1946 DOA: 06/29/2020 PCP: Patient, No Pcp Per  HPI/Recap of past 24 hours: Patient was admitted to the hospital with a working diagnosis of bilateral subarachnoid/subdural hemorrhage secondary to left skull fracture from a mechanical fall. Hospitalization complicated by sepsis due to Klebsiella pneumonia, endorgan failure acute hypoxic respiratory failure (not present on admission).  74 year old male who presented after mechanical fall. He does have significant past medical history for hypertension and type 2 diabetes mellitus. He fell from ground-level resulting in head injury while walking his dog in the park. Apparently patient had been drinking alcohol. On his initial physical examination he was bleeding from his left ear. He was altered, unable to follow commands.  CT head and spine showed nondisplaced fracture of the left parietal calvarium along the leftlambdoid suture extending into the left occipital calvarium and left temporal bone with involvement of the left mastoid air cell and external auditory canal. Bilateral subdural and subarachnoid hemorrhages. No midline shift. Focal hemorrhage with small pockets of pneumocephalus in the left posterior temporal lobe along the calvarial fracture.  Patient was admitted to the neuro intensive care unit.  Was evaluated by ENT, no intervention recommended at this point.  On July 14 patient developed fever and worsening leukocytosis, lactic 5.1,he was started onshort course ofempiric antibiotic therapy with cefepimeand vancomycin for 7 days. He continued to have persistent encephalopathy.  July 15 he had progressive worsening mental status, and was emergently intubated and placed on invasive mechanical ventilation.  Underwent percutaneous tracheostomy on July24.Persistent leukocytosis and significant sputum production. He was diagnosed with Klebsiella pneumonia on July 25th  and placed on intravenous antibiotics.  Failed spontaneous breathing trials.  PMT had goals of care discussion with family 07/20/20 with plan to transition to comfort care on 07/26/20 to give time for other family members to visit.  No escalation of treatment moving forward til then per family request.  On 07/26/20 family made decision for comfort measures only.  Patient was taken off the vent.  07/27/20:  Seen and examined.  Appears comfortable.  No family members in the room.  TOC assisting with residential hospice placement.  Family prefers [facility] [place] in [place].   Assessment/Plan: Principal Problem:   Subarachnoid hemorrhage following injury (HCC) Active Problems:   Encephalopathy acute   Tachypnea   Traumatic hemorrhage of cerebrum with loss of consciousness of 30 minutes or less (HCC)   Acute respiratory failure with hypoxia (HCC)   Status post tracheostomy (HCC)   Pressure injury of skin   Palliative care by specialist   Goals of care, counseling/discussion  Assessment: -Traumatic left skull fracture with bilateral subarachnoid and subdural hematoma. -Acute hypoxic respiratory failure due to Klebsiella HCAP status post trach placement.  -Temporal bone fracture.  -Uncontrolled T2DM Hg A1c 8.5.  -Alcohol intoxication.  -Resolved Hypernatremia/ hypokalemia. 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    Status is: Inpatient  Dispo: The patient is from: Home  Anticipated d/c is to: Residential hospice  Anticipated d/c date is: 07/28/20.  Patient currently is medically stable to d/c to hospice care.   DVT prophylaxis:      Comfort care only. Code Status:              DNR as of 07/20/20 and comfort care on 07/26/20 Family Communication:       No family at the bedside       Consultants:  ENT   Neurosurgery  Palliative care team   Procedures:     Trach placement   Peg tube placement   Objective: Vitals:   07/27/20 0132 07/27/20 0600 07/27/20 0800 07/27/20 1000  BP:      Pulse: (!) 108 (!) 114    Resp: (!) 23 (!) 21 (!) 21 12  Temp:      TempSrc:      SpO2: 93% (!) 89%    Weight:      Height:        Intake/Output Summary (Last 24 hours) at 07/27/2020 1438 Last data filed at 07/27/2020 1400 Gross per 24 hour  Intake 79.45 ml  Output 1550 ml  Net -1470.55 ml   Filed Weights   07/21/20 0500 07/22/20 0500 07/26/20 0500  Weight: 73 kg 71.7 kg 70.2 kg    Exam:   General: 74 y.o. year-old male well-developed well-nourished no acute distress.  Does not follow commands.  Does not track with his eyes.  Cardiovascular: Regular rate and rythm no rubs or gallops.  Respiratory: Trach in place  Abdomen: PEG tube in place.  Hypoactive bowel sounds.  Musculoskeletal: Trace lower extremity edema bilaterally.    Psychiatry: Unable to assess due to altered mental status.  Data Reviewed: CBC: No results for input(s): WBC, NEUTROABS, HGB, HCT, MCV, PLT in the last 168 hours. Basic Metabolic Panel: No results for input(s): NA, K, CL, CO2, GLUCOSE, BUN, CREATININE, CALCIUM, MG, PHOS in the last 168 hours. GFR: Estimated Creatinine Clearance: 79.2 mL/min (A) (by C-G formula based on SCr of 0.56 mg/dL (L)). Liver Function Tests: No results for input(s): AST, ALT, ALKPHOS, BILITOT, PROT, ALBUMIN in the last 168 hours. No results for input(s): LIPASE, AMYLASE in the last 168 hours. No results for input(s): AMMONIA in the last 168 hours. Coagulation Profile: No results for input(s): INR, PROTIME in the last 168 hours. Cardiac Enzymes: No results for input(s): CKTOTAL, CKMB, CKMBINDEX, TROPONINI in the last 168 hours. BNP (last 3 results) No results for input(s): PROBNP in the last 8760 hours. HbA1C: No results for input(s): HGBA1C in the last 72 hours. CBG: Recent Labs  Lab 07/25/20 1512 07/25/20 1940 07/25/20 2335  07/26/20 0323 07/26/20 0804  GLUCAP 180* 199* 225* 187* 166*   Lipid Profile: No results for input(s): CHOL, HDL, LDLCALC, TRIG, CHOLHDL, LDLDIRECT in the last 72 hours. Thyroid Function Tests: No results for input(s): TSH, T4TOTAL, FREET4, T3FREE, THYROIDAB in the last 72 hours. Anemia Panel: No results for input(s): VITAMINB12, FOLATE, FERRITIN, TIBC, IRON, RETICCTPCT in the last 72 hours. Urine analysis:    Component Value Date/Time   COLORURINE YELLOW 06/30/2020 1650   APPEARANCEUR CLEAR 06/30/2020 1650   LABSPEC 1.018 06/30/2020 1650   PHURINE 5.0 06/30/2020 1650   GLUCOSEU >=500 (A) 06/30/2020 1650   HGBUR SMALL (A) 06/30/2020 1650   BILIRUBINUR NEGATIVE 06/30/2020 1650   KETONESUR 5 (A) 06/30/2020 1650   PROTEINUR NEGATIVE 06/30/2020 1650   NITRITE NEGATIVE 06/30/2020 1650   LEUKOCYTESUR NEGATIVE 06/30/2020 1650   Sepsis Labs: @LABRCNTIP (procalcitonin:4,lacticidven:4)  ) No results found for this or any previous visit (from the past 240 hour(s)).    Studies: No results found.  Scheduled Meds:   Continuous Infusions:  morphine 4 mg/hr (07/27/20 1420)     LOS: 28 days     09/26/20, MD [facility] Pager [phone]  If 7PM-7AM, please contact night-coverage [email or web address] Password [facility] 07/27/2020, 2:38 PM

## 2020-07-27 NOTE — Progress Notes (Signed)
Patient transferred to [facility] room 23. DNR/ comfort care. morphine drip infusing. Pat unresponsive. Eyes open. Trach intact with large amount of yellow secretions. Trach care provided. Per report, family notified earlier of plan to transfer. No family at bedside at present. Continue plan of care as listed.

## 2020-07-27 NOTE — TOC Progression Note (Addendum)
Transition of Care [facility]) - Progression Note    Patient Details  Name: Harold Mckee MRN: [id number] Date of Birth: [birth year]  Transition of Care [facility] CM/SW Contact  Glennon Mac, RN Phone Number: 07/27/2020, 2:05 PM  Clinical Narrative:  CM referral for residential hospice facility; family prefers [facility] [place] in [place].  Spoke with Norm Parcel, liaison for [facility](phone: [phone]):   She states that currently there are no beds available at residential hospice facility today.  Patient will be placed on list for a bed at facility when it becomes available, likely within the next 1 to 2 days.  I spoke with patient's daughter, Harold Mckee, and updated her on the current bed situation.  She is aware that patient may transfer to another unit prior to discharging to residential hospice facility.  Will provide updates as they are available.    Expected Discharge Plan: Hospice Medical Facility Barriers to Discharge: Hospice Bed not available  Expected Discharge Plan and Services Expected Discharge Plan: Hospice Medical Facility   Discharge Planning Services: CM Consult Post Acute Care Choice: Hospice Living arrangements for the past 2 months: Single Family Home                                       Social Determinants of Health (SDOH) Interventions    Readmission Risk Interventions No flowsheet data found.  Quintella Baton, RN, BSN  Trauma/Neuro ICU Case Manager [phone]

## 2020-07-27 NOTE — Telephone Encounter (Signed)
FYI: He's coming in Friday the 13th at 9:40.

## 2020-07-28 ENCOUNTER — Encounter: Payer: Self-pay | Admitting: Orthopedic Surgery

## 2020-07-28 NOTE — Discharge Summary (Signed)
Physician Discharge Summary  Harold FriskJames Mckee NFA:[id number]:[id number] DOB: 09-Sep-1946 DOA: 06/29/2020  PCP: Patient, No Pcp Per  Admit date: 06/29/2020 Discharge date: 07/28/2020  Admitted From: Home Disposition: [facility]  Recommendations for Outpatient Follow-up:  Further Care per Hospice Protocol  Home Health: No Equipment/Devices: None   Discharge Condition: Guarded, Poor CODE STATUS: DO NOT RESUSCITATE   Diet recommendation: NPO  Brief/Interim Summary: Patient was admitted to the hospital with a working diagnosis of bilateral subarachnoid/subdural hemorrhage secondary to left skull fracture from a mechanical fall. Hospitalization complicated by sepsis due to Klebsiella pneumonia, endorgan failure acute hypoxic respiratory failure (not present on admission).  74 year old male who presented after mechanical fall. He does have significant past medical history for hypertension and type 2 diabetes mellitus. He fell from ground-level resulting in head injury while walking his dog in the park. Apparently patient had been drinking alcohol. On his initial physical examination he was bleeding from his left ear. He was altered, unable to follow commands.  CT head and spine showed nondisplaced fracture of the left parietal calvarium along the leftlambdoid suture extending into the left occipital calvarium and left temporal bone with involvement of the left mastoid air cell and external auditory canal. Bilateral subdural and subarachnoid hemorrhages. No midline shift. Focal hemorrhage with small pockets of pneumocephalus in the left posterior temporal lobe along the calvarial fracture.  Patient was admitted to the neuro intensive care unit.  Was evaluated by ENT, no intervention recommended at this point.  On July 14 patient developed fever and worsening leukocytosis, lactic 5.1,he was started onshort course ofempiric antibiotic therapy with cefepimeand vancomycin for 7 days. He  continued to have persistent encephalopathy.  July 15 he had progressive worsening mental status, and was emergently intubated and placed on invasive mechanical ventilation.  Underwent percutaneous tracheostomy on July24.Persistent leukocytosis and significant sputum production. He was diagnosed with Klebsiella pneumonia on July 25th and placed on intravenous antibiotics.  Failed spontaneous breathing trials.  PMT had goals of care discussion with family 07/20/20 with plan to transition to comfort care on 07/26/20 to give time for other family members to visit.  No escalation of treatment moving forward til then per family request.  On 07/26/20 family made decision for comfort measures only.  Patient was taken off the vent.  07/27/20:  Seen and examined.  Appears comfortable.  No family members in the room.  TOC assisting with residential hospice placement.  Family [facility] [place].  Today (07/28/20) Patient has a bed available today and will be D/C'd to the [facility] today.   Discharge Diagnoses:  Principal Problem:   Subarachnoid hemorrhage following injury (HCC) Active Problems:   Encephalopathy acute   Tachypnea   Traumatic hemorrhage of cerebrum with loss of consciousness of 30 minutes or less (HCC)   Acute respiratory failure with hypoxia (HCC)   Status post tracheostomy (HCC)   Pressure injury of skin   Palliative care by specialist   Goals of care, counseling/discussion  Traumatic left skull fracture with bilateral subarachnoid and subdural hematoma. Acute hypoxic respiratory failure due to Klebsiella HCAP status post trach placement.  Temporal bone fracture.  Uncontrolled T2DM Hg A1c 8.5.  Alcohol intoxication.  Resolved Hypernatremia/ hypokalemia. Acute on chronic encephalopathy Hypertension Electrolyte imbalance  After palliative care discussion he was removed off of the ventilator and Morphine infusion was started. He was  transferred out of the ICU to [facility] and now has a bed a [facility]. All  medication not in the patient's comfort has been discontinued and he is currently on Glycopyrrolate, Lorazepam, Morphine gtt, Zofran, and Liquifilm Tears. Further Care per Hospice Protocol.    Discharge Instructions  Discharge Instructions    Call MD for:  difficulty breathing, headache or visual disturbances   Complete by: As directed    Call MD for:  extreme fatigue   Complete by: As directed    Call MD for:  hives   Complete by: As directed    Call MD for:  persistant dizziness or light-headedness   Complete by: As directed    Call MD for:  persistant nausea and vomiting   Complete by: As directed    Call MD for:  redness, tenderness, or signs of infection (pain, swelling, redness, odor or green/yellow discharge around incision site)   Complete by: As directed    Call MD for:  severe uncontrolled pain   Complete by: As directed    Call MD for:  temperature >100.4   Complete by: As directed    Discharge instructions   Complete by: As directed    Further Care per Hospice.   Discharge wound care:   Complete by: As directed    Per Hospice Protocol   Increase activity slowly   Complete by: As directed      Allergies as of 07/28/2020   No Known Allergies     Medication List    STOP taking these medications   amLODipine 10 MG tablet Commonly known as: NORVASC   aspirin 81 MG chewable tablet   Clear Eyes for Dry Eyes 1-0.25 % Soln Generic drug: Carboxymethylcellul-Glycerin   hydrochlorothiazide 25 MG tablet Commonly known as: HYDRODIURIL   metFORMIN 1000 MG tablet Commonly known as: GLUCOPHAGE   metoprolol 200 MG 24 hr tablet Commonly known as: TOPROL-XL   omeprazole 40 MG capsule Commonly known as: PRILOSEC   Ozempic (0.25 or 0.5 MG/DOSE) 2 MG/1.5ML Sopn Generic drug: Semaglutide(0.25 or 0.5MG /DOS)   PREVAGEN PO            Discharge Care Instructions  (From admission, onward)          Start     Ordered   07/28/20 0000  Discharge wound care:       Comments: Per Hospice Protocol   07/28/20 1031          No Known Allergies  Consultations:  PCCM/Pulmonary  Palliative Care  Neurosurgery  Procedures/Studies: CT HEAD WO CONTRAST  Result Date: 07/06/2020 CLINICAL DATA:  Follow-up head trauma. EXAM: CT HEAD WITHOUT CONTRAST TECHNIQUE: Contiguous axial images were obtained from the base of the skull through the vertex without intravenous contrast. COMPARISON:  07/03/2020 FINDINGS: Brain: Extensive hemorrhagic contusions and surrounding edema in both anterior frontal lobes are unchanged as are smaller hemorrhagic contusions in the left occipital and left anterior temporal lobes. Subarachnoid hemorrhage is stable to minimally decreased in overall volume. Subdural hematomas over both cerebral convexities are unchanged in size, larger on the left than the right with 8 mm thickness anterior to the left frontal lobe. Small volume hemorrhage in the occipital horns of both lateral ventricles is unchanged. The ventricles are unchanged in size. There is no midline shift. Background moderately advanced chronic small vessel ischemic disease is again noted in the cerebral white matter bilaterally. Vascular: No hyperdense vessel. Skull: Known left temporal bone fracture with left lambdoid suture diastasis. Sinuses/Orbits: Unchanged fluid/blood in the left middle ear cavity and left mastoid air cells. Unchanged air-fluid levels in the  sphenoid sinuses. Unremarkable orbits. Other: None. IMPRESSION: Unchanged intracranial hemorrhage as above including extensive bilateral frontal lobe hemorrhagic contusions. Electronically Signed   By: Sebastian Ache M.D.   On: 07/06/2020 11:06   CT HEAD WO CONTRAST  Result Date: 07/03/2020 CLINICAL DATA:  Posttraumatic head injury. EXAM: CT HEAD WITHOUT CONTRAST TECHNIQUE: Contiguous axial images were obtained from the base of the skull through the  vertex without intravenous contrast. COMPARISON:  CT head 07/01/2020 FINDINGS: Brain: Extensive hemorrhagic contusions in both frontal lobes with surrounding edema. 8 mm subdural hematoma anterior to the left frontal lobe. 4 mm subdural him hematoma anterior to the right frontal lobe. These findings are unchanged. Smaller hemorrhagic contusion left occipital parietal lobe unchanged. Hemorrhagic contusion left anterior temporal lobe unchanged. Small left parietal subdural hematoma unchanged. Small hemorrhagic contusion right temporal lobe. Diffuse subarachnoid hemorrhage unchanged. There is blood layering in the occipital pole horns bilaterally unchanged. No hydrocephalus. No midline shift. Diffuse white matter hypodensity bilaterally is stable. Vascular: Negative for hyperdense vessel. Skull: Fracture through the left mastoid sinus and temporal bone unchanged. Diastasis left lambdoid suture. Sinuses/Orbits: Mucosal edema paranasal sinuses. Air-fluid level sphenoid sinus unchanged. NG tube in place. Other: None IMPRESSION: Stable CT without interval change Extensive hemorrhagic contusions in the frontal lobes bilaterally with bifrontal subdural hematomas. Diffuse subarachnoid hemorrhage. Intraventricular hemorrhage without hydrocephalus. Electronically Signed   By: Marlan Palau M.D.   On: 07/03/2020 08:46   CT HEAD WO CONTRAST  Addendum Date: 07/01/2020   ADDENDUM REPORT: 07/01/2020 21:02 ADDENDUM: Study discussed by telephone with Dr. Juanetta Snow on 07/01/2020 at 2056 hours. Electronically Signed   By: Odessa Fleming M.D.   On: 07/01/2020 21:02   Result Date: 07/01/2020 CLINICAL DATA:  74 year old male with recent traumatic skull fractures and intracranial hemorrhage. Worsening altered mental status. EXAM: CT HEAD WITHOUT CONTRAST TECHNIQUE: Contiguous axial images were obtained from the base of the skull through the vertex without intravenous contrast. COMPARISON:  Head CT 0556 hours today and earlier. FINDINGS:  Brain: Anterior bilateral frontal convexity subdural hematomas are possible measuring 6-7 mm in thickness (series 3, image 21 on the left) in addition to the extensive bifrontal hemorrhagic contusions and subarachnoid hemorrhage. Severe anterior frontal lobe edema. But relatively mild posterior displacement of the frontal horns. And there remains no midline shift (series 3, image 21). Small volume of intraventricular hemorrhage with no ventriculomegaly. Basilar cisterns remain patent. Scattered additional small bilateral hemorrhagic contusions and/or subarachnoid blood. There is a left occipital hemorrhagic contusion with adjacent edema on series 3, image 14. There are right greater than left inferior temporal lobe hemorrhagic contusions best seen on sagittal series 6 (image 14 on the right). Superimposed severe chronic appearing cerebral white matter disease. No definite acute cortically based infarct. Deep gray matter nuclei appear stable. Vascular: Calcified atherosclerosis at the skull base. No suspicious intracranial vascular hyperdensity. Skull: Left skull base and nondisplaced left posterior convexity calvarium fracture. There is also a nondisplaced left anterior frontal bone fracture of the calvarium suspected on series 4, image 54. No new osseous abnormality. Sinuses/Orbits: Stable layering hemorrhage in the left sphenoid sinus, left middle ear and mastoids with underlying skull base fracture better demonstrated on temporal bone CT yesterday. Stable and well pneumatized other sinuses and mastoids. Other: Hemorrhage in the left external auditory canal contiguous with what appears to be a severe injury of the left pinna with soft tissue swelling. Broad-based posterior scalp hematoma. Visualized orbit soft tissues are within normal limits. IMPRESSION: 1. Extensive bifrontal hemorrhagic  contusions with Possible Superimposed Anterior Convexity Subdural Hematomas, 6-7 mm bilaterally. Severe anterior frontal lobe  edema, but still only mild posterior displacement of the frontal horns. 2. Superimposed bilateral temporal and other smaller hemorrhagic contusions. Scattered bilateral subarachnoid hemorrhage. 3. Small volume of intraventricular hemorrhage without ventriculomegaly. Basilar cisterns remain patent. No midline shift. 4. Underlying severe chronic white matter disease suspected. No definite acute infarct. 5. Multiple skull fractures suspected including a nondisplaced left frontal bone fracture. Stable hemorrhage in the left middle ear, mastoids, left external auditory canal, left sphenoid sinus. 6. Posterior scalp hematoma. Electronically Signed: By: Odessa Fleming M.D. On: 07/01/2020 20:53   CT HEAD WO CONTRAST  Result Date: 07/01/2020 CLINICAL DATA:  Follow-up subarachnoid hemorrhage; headache, posttraumatic. EXAM: CT HEAD WITHOUT CONTRAST TECHNIQUE: Contiguous axial images were obtained from the base of the skull through the vertex without intravenous contrast. COMPARISON:  Prior head CT examinations 06/30/2020 and earlier FINDINGS: Brain: Extensive acute subarachnoid hemorrhage overlying the bilateral cerebral convexities is similar to minimally increased as compared to the prior head CT of 06/30/2020. Extra-axial hemorrhage along the anterior frontal lobe likely reflects a combination of subarachnoid and subdural hemorrhage and has slightly increased, now measuring 8 mm in greatest thickness (previously 6 mm). There has been continued blooming of extensive hemorrhagic parenchymal contusions within the anteroinferior frontal lobes. Similar appearance of parenchymal contusions within the inferior frontal lobes and left temporal occipital lobes bilaterally. Stable small volume intraventricular hemorrhage. There is no midline shift or evidence of hydrocephalus. No cortically based infarct is definitively identified. Redemonstrated background chronic white matter disease and chronic left caudate nucleus lacunar infarct.  Vascular: No hyperdense vessel Skull: Known left calvarial and skull base fractures. Sinuses/Orbits: Visualized orbits show no acute finding. Unchanged fluid within the sphenoid sinuses, likely at least partially reflecting blood products. Mild left maxillary sinus mucosal thickening. Extensive opacification of the left middle ear cavity and left external auditory canal with a dressing overlying the left external ear. Partial opacification of left mastoid air cells. Other: Redemonstrated left posterior scalp hematoma. IMPRESSION: 1. Continued interval blooming of hemorrhagic contusions within the anteroinferior frontal lobes bilaterally. Similar appearance of hemorrhagic contusions within the inferior temporal lobes and left temporal occipital lobes. 2. Extensive acute subarachnoid hemorrhage overlying the cerebral convexities, similar to minimally increased. 3. Extra-axial hemorrhage along the anterior frontal lobes bilaterally, likely reflecting a combination of subarachnoid and subdural hemorrhage, slightly increased (now measuring 8 mm in greatest thickness). 4. Stable, small volume intraventricular hemorrhage. No hydrocephalus. 5. Known left calvarial and skull base fractures. 6. As before, there is extensive opacification of the left middle ear cavity and left external auditory canal, likely reflecting the presence of blood products. 7. Redemonstrated left posterior scalp hematoma. Electronically Signed   By: Jackey Loge DO   On: 07/01/2020 09:05   CT HEAD WO CONTRAST  Result Date: 06/30/2020 CLINICAL DATA:  Follow-up subarachnoid hemorrhage EXAM: CT HEAD WITHOUT CONTRAST TECHNIQUE: Contiguous axial images were obtained from the base of the skull through the vertex without intravenous contrast. COMPARISON:  Yesterday FINDINGS: Brain: Generalized increase in subarachnoid hemorrhage along the cerebral convexities. Small volume intraventricular hemorrhage which is likely reflux. No hydrocephalus. Blooming of  hemorrhagic contusions in the left more than right inferior frontal lobes. Smaller area of hemorrhagic contusion at the left temporal occipital convexity deep to a skull fracture. Newly apparent contusion along the inferior temporal lobes on both sides. Extra-axial hemorrhage along the frontal poles is likely some combination of subarachnoid and subdural. The  thickness on the left is greatest at 6 mm, similar to prior. Chronic white matter disease. No midline shift or complicating infarct. Chronic lacune at the left caudate head. Vascular: Negative Skull: Left calvarial and skull base fracturing as noted previously. Sinuses/Orbits: Similar degree of left sphenoid hemosinus. IMPRESSION: 1. Blooming contusions in the bilateral inferior frontal, bilateral inferior temporal, and left parieto-occipital lobes. 2. Generalized increase in subarachnoid hemorrhage. There is mild intraventricular reflux without hydrocephalus. 3. Progressive scalp swelling over the left calvarial fracture. Pneumocephalus is no longer seen. Electronically Signed   By: Marnee Spring M.D.   On: 06/30/2020 06:48   CT Head Wo Contrast  Result Date: 06/29/2020 CLINICAL DATA:  74 year old male with head trauma. EXAM: CT HEAD WITHOUT CONTRAST CT CERVICAL SPINE WITHOUT CONTRAST TECHNIQUE: Multidetector CT imaging of the head and cervical spine was performed following the standard protocol without intravenous contrast. Multiplanar CT image reconstructions of the cervical spine were also generated. COMPARISON:  Head CT dated 06/14/2014. FINDINGS: Evaluation of this exam is limited due to motion artifact. CT HEAD FINDINGS Brain: There is moderate age-related atrophy and chronic microvascular ischemic changes. Bilateral frontoparietal subdural hemorrhages measuring up to 7 mm in thickness over the frontal lobes. There is extension of the subdural blood along the anterior falx. Left parietal subdural hemorrhage measures 6 mm in thickness. There is mild  mass effect associated with this subdural hemorrhage on the adjacent brain parenchyma. Bifrontal subarachnoid hemorrhages as well as subarachnoid hemorrhage in the right parietal lobe. Diffuse left temporal subarachnoid hemorrhage. There is a focal area of hemorrhage measuring approximately 2.3 x 1.1 cm over the left posterior temporal lobe (17/1) which may represent combination of subarachnoid hemorrhage and intraparenchymal hemorrhage. Several small pockets of air associated with this hemorrhage consistent with overlying calvarial fracture. No midline shift. Vascular: No hyperdense vessel or unexpected calcification. Skull: There is a nondisplaced fracture of the left parietal calvarium along the lambdoid suture extending into the left occipital calvarium. There is fracture of the temporal bone and mastoid air cells with extension of the fracture into the left external auditory canal. Dedicated temporal bone CT is recommended for further evaluation. Sinuses/Orbits: There is partial opacification of the left sphenoid sinus with air-fluid level and high attenuating content. Minimal left mastoid effusion. Other: There is left parietal scalp hematoma along the skull fracture. CT CERVICAL SPINE FINDINGS Alignment: No acute subluxation. There is straightening of normal cervical lordosis which may be positional or due to muscle spasm. Skull base and vertebrae: No acute fracture. Soft tissues and spinal canal: No prevertebral fluid or swelling. No visible canal hematoma. Disc levels:  Multilevel degenerative changes with spurring. Upper chest: Negative. Other: Bilateral carotid bulb calcified plaques. IMPRESSION: 1. Nondisplaced fracture of the left parietal calvarium along the left lambdoid suture extending into the left occipital calvarium and left temporal bone with involvement of the left mastoid air cell and external auditory canal. Dedicated temporal bone CT is recommended for further evaluation. 2. Bilateral  subdural and subarachnoid hemorrhages. No midline shift. 3. Focal hemorrhage with small pockets of pneumocephalus in the left posterior temporal lobe along the calvarial fracture. This likely represents combination of subarachnoid and intraparenchymal hemorrhage. 4. No acute/traumatic cervical spine pathology. These results were called by telephone at the time of interpretation on 06/29/2020 at 8:25 pm to provider DAN FLOYD , who verbally acknowledged these results. Electronically Signed   By: Elgie Collard M.D.   On: 06/29/2020 20:28   CT Cervical Spine Wo Contrast  Result  Date: 06/29/2020 CLINICAL DATA:  74 year old male with head trauma. EXAM: CT HEAD WITHOUT CONTRAST CT CERVICAL SPINE WITHOUT CONTRAST TECHNIQUE: Multidetector CT imaging of the head and cervical spine was performed following the standard protocol without intravenous contrast. Multiplanar CT image reconstructions of the cervical spine were also generated. COMPARISON:  Head CT dated 06/14/2014. FINDINGS: Evaluation of this exam is limited due to motion artifact. CT HEAD FINDINGS Brain: There is moderate age-related atrophy and chronic microvascular ischemic changes. Bilateral frontoparietal subdural hemorrhages measuring up to 7 mm in thickness over the frontal lobes. There is extension of the subdural blood along the anterior falx. Left parietal subdural hemorrhage measures 6 mm in thickness. There is mild mass effect associated with this subdural hemorrhage on the adjacent brain parenchyma. Bifrontal subarachnoid hemorrhages as well as subarachnoid hemorrhage in the right parietal lobe. Diffuse left temporal subarachnoid hemorrhage. There is a focal area of hemorrhage measuring approximately 2.3 x 1.1 cm over the left posterior temporal lobe (17/1) which may represent combination of subarachnoid hemorrhage and intraparenchymal hemorrhage. Several small pockets of air associated with this hemorrhage consistent with overlying calvarial  fracture. No midline shift. Vascular: No hyperdense vessel or unexpected calcification. Skull: There is a nondisplaced fracture of the left parietal calvarium along the lambdoid suture extending into the left occipital calvarium. There is fracture of the temporal bone and mastoid air cells with extension of the fracture into the left external auditory canal. Dedicated temporal bone CT is recommended for further evaluation. Sinuses/Orbits: There is partial opacification of the left sphenoid sinus with air-fluid level and high attenuating content. Minimal left mastoid effusion. Other: There is left parietal scalp hematoma along the skull fracture. CT CERVICAL SPINE FINDINGS Alignment: No acute subluxation. There is straightening of normal cervical lordosis which may be positional or due to muscle spasm. Skull base and vertebrae: No acute fracture. Soft tissues and spinal canal: No prevertebral fluid or swelling. No visible canal hematoma. Disc levels:  Multilevel degenerative changes with spurring. Upper chest: Negative. Other: Bilateral carotid bulb calcified plaques. IMPRESSION: 1. Nondisplaced fracture of the left parietal calvarium along the left lambdoid suture extending into the left occipital calvarium and left temporal bone with involvement of the left mastoid air cell and external auditory canal. Dedicated temporal bone CT is recommended for further evaluation. 2. Bilateral subdural and subarachnoid hemorrhages. No midline shift. 3. Focal hemorrhage with small pockets of pneumocephalus in the left posterior temporal lobe along the calvarial fracture. This likely represents combination of subarachnoid and intraparenchymal hemorrhage. 4. No acute/traumatic cervical spine pathology. These results were called by telephone at the time of interpretation on 06/29/2020 at 8:25 pm to provider DAN FLOYD , who verbally acknowledged these results. Electronically Signed   By: Elgie Collard M.D.   On: 06/29/2020 20:28    DG Chest Port 1 View  Result Date: 07/09/2020 CLINICAL DATA:  Post tracheostomy. EXAM: PORTABLE CHEST 1 VIEW COMPARISON:  Radiograph 07/05/2020 and 07/03/2020. FINDINGS: Interval tracheostomy appears well positioned. Feeding tube projects below the diaphragm. The heart size and mediastinal contours are stable. There are lower lung volumes with increased bibasilar opacities, most likely reflecting atelectasis. No pneumothorax or significant pleural effusion. The bones appear unchanged. IMPRESSION: 1. Satisfactory position of the tracheostomy. 2. Lower lung volumes with increased bibasilar opacities, most likely atelectasis. Electronically Signed   By: Carey Bullocks M.D.   On: 07/09/2020 15:22   DG Chest Port 1 View  Result Date: 07/05/2020 CLINICAL DATA:  Respiratory failure. EXAM: PORTABLE CHEST 1  VIEW COMPARISON:  07/03/2020 FINDINGS: Endotracheal tube terminates 3.5 cm above the carina. A feeding tube is looped upon itself in the proximal stomach. The cardiomediastinal silhouette is unchanged with normal heart size. Patchy left basilar airspace opacity has mildly improved. No sizable pleural effusion or pneumothorax is identified. IMPRESSION: Mildly improved left basilar aeration. Electronically Signed   By: Sebastian Ache M.D.   On: 07/05/2020 07:42   DG CHEST PORT 1 VIEW  Result Date: 07/03/2020 CLINICAL DATA:  Intubated. EXAM: PORTABLE CHEST 1 VIEW COMPARISON:  07/02/2020 FINDINGS: The endotracheal tube tip is 2 cm above the carina. This could be retracted 4 cm. Normal sized heart. No significant change in patchy opacity at the left lung base. Interval minimal linear atelectasis at the right lung base. Unremarkable bones. IMPRESSION: 1. Endotracheal tube tip 2 cm above the carina. This could be retracted 4 cm. 2. Stable left basilar pneumonia or patchy atelectasis. 3. Interval minimal linear atelectasis at the right lung base. Electronically Signed   By: Beckie Salts M.D.   On: 07/03/2020 11:51    DG Chest Port 1 View  Result Date: 07/02/2020 CLINICAL DATA:  74 year old male with history of respiratory failure. EXAM: PORTABLE CHEST 1 VIEW COMPARISON:  Chest x-ray 07/01/2020. FINDINGS: An endotracheal tube is in place with tip 3.8 cm above the carina. Feeding tube is position coiled in the stomach with tip adjacent to the gastroesophageal junction. Lung volumes are slightly low. Opacity at the left base which may reflect atelectasis and/or consolidation. Trace left pleural effusion. Right lung is clear. No right pleural effusion. No pneumothorax. No evidence of pulmonary edema. No definite suspicious appearing pulmonary nodules or masses are noted. Heart size is normal. Upper mediastinal contours are within normal limits. Aortic atherosclerosis. IMPRESSION: 1. Support apparatus, as above. 2. Persistent atelectasis and/or consolidation in the left lower lobe. Trace left pleural effusion. Electronically Signed   By: Trudie Reed M.D.   On: 07/02/2020 07:32   DG CHEST PORT 1 VIEW  Result Date: 07/01/2020 CLINICAL DATA:  74 year old male status post intubation. EXAM: PORTABLE CHEST 1 VIEW COMPARISON:  Chest radiograph dated 06/30/2020. FINDINGS: Endotracheal tube with tip approximately 6 cm above the carina. Feeding tube extends below the diaphragm with tip in the left upper abdomen in the proximal stomach. There has been interval development of a patchy area of airspace density at the left lung base which may represent developing infiltrate or aspiration. Clinical correlation is recommended. Minimal right infrahilar density also noted. There is no pleural effusion or pneumothorax. The cardiac silhouette is within limits. Atherosclerotic calcification of the aortic arch. No acute osseous pathology. IMPRESSION: 1. Endotracheal tube above the carina. 2. Feeding tube with tip in the proximal stomach. 3. Interval development of left lung base airspace opacity. Electronically Signed   By: Elgie Collard  M.D.   On: 07/01/2020 20:36   DG CHEST PORT 1 VIEW  Result Date: 06/30/2020 CLINICAL DATA:  Tachypnea EXAM: PORTABLE CHEST 1 VIEW COMPARISON:  02/24/2011 FINDINGS: Normal heart size and mediastinal contours. No acute infiltrate or edema. No effusion or pneumothorax. No acute osseous findings. IMPRESSION: No active disease. Electronically Signed   By: Marnee Spring M.D.   On: 06/30/2020 04:35   DG Abd Portable 1V  Result Date: 06/30/2020 CLINICAL DATA:  Feeding tube placement. EXAM: PORTABLE ABDOMEN - 1 VIEW COMPARISON:  None. FINDINGS: The bowel gas pattern is normal. Distal tip of feeding tube is seen in proximal stomach. No radio-opaque calculi or other significant radiographic  abnormality are seen. IMPRESSION: Distal tip of feeding tube seen in proximal stomach. Electronically Signed   By: Lupita Raider M.D.   On: 06/30/2020 13:03   EEG adult  Result Date: 07/05/2020 Charlsie Quest, MD     07/05/2020 10:03 AM Patient Name: Hosam Mcfetridge MRN: [id number] Epilepsy Attending: Charlsie Quest Referring Physician/Provider: DR Levy Pupa Date: 7/19/201 Duration: 25.04 mins Patient history: 73yo M with SAH and seizure like activity, continues to be altered. EEG to evaluate for seizure. Level of alertness: Awake/ lethargic AEDs during EEG study: LEV Technical aspects: This EEG study was done with scalp electrodes positioned according to the 10-20 International system of electrode placement. Electrical activity was acquired at a sampling rate of 500Hz  and reviewed with a high frequency filter of 70Hz  and a low frequency filter of 1Hz . EEG data were recorded continuously and digitally stored. Description: No posterior dominant rhythm was seen. EEG showed continuous generalized 3 to 6 Hz theta-delta slowing. Hyperventilation and photic stimulation were not performed.   EEG was technically difficult due to significant myogenic artifact. ABNORMALITY -Continuous slow, generalized IMPRESSION: This study is  suggestive of moderate to severe diffuse encephalopathy, nonspecific etiology. No seizures or epileptiform discharges were seen throughout the recording. Charlsie Quest   EEG adult  Result Date: 06/30/2020 Charlsie Quest, MD     06/30/2020 10:43 AM Patient Name: Demontrae Gilbert MRN: [id number] Epilepsy Attending: Charlsie Quest Referring Physician/Provider: Rutherford Guys, PA Date: 06/30/2020 Duration: 24.28 mins Patient history: 74yo M with SAH and seizure like activity. EEG to evaluate for seizure. Level of alertness: Awake/ lethargic AEDs during EEG study: LEV, ativan Technical aspects: This EEG study was done with scalp electrodes positioned according to the 10-20 International system of electrode placement. Electrical activity was acquired at a sampling rate of 500Hz  and reviewed with a high frequency filter of 70Hz  and a low frequency filter of 1Hz . EEG data were recorded continuously and digitally stored. Description: EEG showed continuous generalized 3 to 6 Hz theta-delta slowing admixed with 15 to 18 Hz, 2-3 uV beta activity with irregular morphology distributed symmetrically and diffusely.  Hyperventilation and photic stimulation were not performed.   ABNORMALITY -Continuous slow, generalized -Excessive beta, generalized IMPRESSION: This study is suggestive of moderate diffuse encephalopathy, nonspecific etiology.The excessive beta activity seen in the background is most likely due to the effect of benzodiazepine and is a benign EEG pattern. No seizures or epileptiform discharges were seen throughout the recording. Priyanka O Yadav   VAS [place] UPPER EXTREMITY VENOUS DUPLEX  Result Date: 07/08/2020 UPPER VENOUS STUDY  Indications: Swelling Comparison Study: No prior studies. Performing Technologist: Jean Rosenthal  Examination Guidelines: A complete evaluation includes B-mode imaging, spectral Doppler, color Doppler, and power Doppler as needed of all accessible portions of each vessel. Bilateral testing  is considered an integral part of a complete examination. Limited examinations for reoccurring indications may be performed as noted.  Left Findings: +----------+------------+---------+-----------+----------+--------------+ LEFT      CompressiblePhasicitySpontaneousProperties   Summary     +----------+------------+---------+-----------+----------+--------------+ IJV           Full       Yes       Yes                             +----------+------------+---------+-----------+----------+--------------+ Subclavian    Full       Yes       Yes                             +----------+------------+---------+-----------+----------+--------------+  Axillary      Full       Yes       Yes                             +----------+------------+---------+-----------+----------+--------------+ Brachial      Full       Yes       Yes                             +----------+------------+---------+-----------+----------+--------------+ Radial        Full                                                 +----------+------------+---------+-----------+----------+--------------+ Ulnar         Full                                                 +----------+------------+---------+-----------+----------+--------------+ Cephalic                                            Not visualized +----------+------------+---------+-----------+----------+--------------+ Basilic     Partial                Yes                  Acute      +----------+------------+---------+-----------+----------+--------------+  Summary:  Left: No evidence of deep vein thrombosis in the upper extremity. Findings consistent with acute superficial vein thrombosis involving the left basilic vein.  *See table(s) above for measurements and observations.  Diagnosing physician: Waverly Ferrari MD Electronically signed by Waverly Ferrari MD on 07/08/2020 at 11:12:53 PM.    Final    CT TEMPORAL BONES WO  CONTRAST  Result Date: 06/30/2020 CLINICAL DATA:  Left temporal bone fracture EXAM: CT TEMPORAL BONES WITHOUT CONTRAST TECHNIQUE: Axial and coronal plane CT imaging of the petrous temporal bones was performed with thin-collimation image reconstruction. No intravenous contrast was administered. Multiplanar CT image reconstructions were also generated. COMPARISON:  None. FINDINGS: Known left-sided temporal bone fracture with primarily longitudinal orientation. There is widening at the malleoincudal joint without out-right dislocation. The stapes appears located. No pneumo labyrinth or otic capsule involvement. There is extension inferiorly into the left TMJ. Hemotympanum and hemo mastoid. Soft tissue gas below the temporal bone and at the left tegmen. There is continuation of the fracture along the left lambdoid suture which is diastatic. Negative right temporal bone. Bilateral sphenoid sinusitis which may be chronic as there is no visible traversing fracture. Intracranial findings as described on dedicated head CT. IMPRESSION: Longitudinal left temporal bone fracture with mild diastasis of the malleoincudal joint. No otic capsule involvement. Hemotympanum on the left with pneumocephalus and soft tissue emphysema. Electronically Signed   By: Marnee Spring M.D.   On: 06/30/2020 07:07    Subjective: Seen and examined at bedside and patient is unresponsive but resting comfortably. Has short shallow respirations. No acute Distress. Has coughed out some sputum from his trach. No other concerns or complaints at this time and  family at bedside and at peace with their decision to go to Hospice.   Discharge Exam: Vitals:   07/28/20 0418 07/28/20 0830  BP: 99/60   Pulse: (!) 128 99  Resp: 20 20  Temp: 99.5 F (37.5 C)   SpO2: (!) 59%    Vitals:   07/27/20 1000 07/27/20 1706 07/28/20 0418 07/28/20 0830  BP:  120/70 99/60   Pulse:  (!) 127 (!) 128 99  Resp: Temp:  99.1 F (37.3 C) 99.5 F  (37.5 C)   TempSrc:  Axillary Oral   SpO2:  (!) 74% (!) 59%   Weight:      Height:       General: Pt is not awake, not in acute distress Cardiovascular: RRR, S1/S2 +, no rubs, no gallops Respiratory: Diminished bilaterally, no wheezing, no rhonchi; Short shallow breaths  Abdominal: Soft, NT, ND, bowel sounds + Extremities: no edema, no cyanosis  The results of significant diagnostics from this hospitalization (including imaging, microbiology, ancillary and laboratory) are listed below for reference.    Microbiology: No results found for this or any previous visit (from the past 240 hour(s)).   Labs: BNP (last 3 results) No results for input(s): BNP in the last 8760 hours. Basic Metabolic Panel: No results for input(s): NA, K, CL, CO2, GLUCOSE, BUN, CREATININE, CALCIUM, MG, PHOS in the last 168 hours. Liver Function Tests: No results for input(s): AST, ALT, ALKPHOS, BILITOT, PROT, ALBUMIN in the last 168 hours. No results for input(s): LIPASE, AMYLASE in the last 168 hours. No results for input(s): AMMONIA in the last 168 hours. CBC: No results for input(s): WBC, NEUTROABS, HGB, HCT, MCV, PLT in the last 168 hours. Cardiac Enzymes: No results for input(s): CKTOTAL, CKMB, CKMBINDEX, TROPONINI in the last 168 hours. BNP: Invalid input(s): POCBNP CBG: Recent Labs  Lab 07/25/20 1512 07/25/20 1940 07/25/20 2335 07/26/20 0323 07/26/20 0804  GLUCAP 180* 199* 225* 187* 166*   D-Dimer No results for input(s): DDIMER in the last 72 hours. Hgb A1c No results for input(s): HGBA1C in the last 72 hours. Lipid Profile No results for input(s): CHOL, HDL, LDLCALC, TRIG, CHOLHDL, LDLDIRECT in the last 72 hours. Thyroid function studies No results for input(s): TSH, T4TOTAL, T3FREE, THYROIDAB in the last 72 hours.  Invalid input(s): FREET3 Anemia work up No results for input(s): VITAMINB12, FOLATE, FERRITIN, TIBC, IRON, RETICCTPCT in the last 72 hours. Urinalysis    Component  Value Date/Time   COLORURINE YELLOW 06/30/2020 1650   APPEARANCEUR CLEAR 06/30/2020 1650   LABSPEC 1.018 06/30/2020 1650   PHURINE 5.0 06/30/2020 1650   GLUCOSEU >=500 (A) 06/30/2020 1650   HGBUR SMALL (A) 06/30/2020 1650   BILIRUBINUR NEGATIVE 06/30/2020 1650   KETONESUR 5 (A) 06/30/2020 1650   PROTEINUR NEGATIVE 06/30/2020 1650   NITRITE NEGATIVE 06/30/2020 1650   LEUKOCYTESUR NEGATIVE 06/30/2020 1650   Sepsis Labs Invalid input(s): PROCALCITONIN,  WBC,  LACTICIDVEN Microbiology No results found for this or any previous visit (from the past 240 hour(s)).  Time coordinating discharge: 25 minutes  SIGNED:  Merlene Laughter, DO [facility] 07/28/2020, 10:31 AM Pager is on AMION  If 7PM-7AM, please contact night-coverage [email or web address]

## 2020-07-28 NOTE — Progress Notes (Signed)
[facility]  Met with daughter Elmyra Ricks and discusses Hospice home agreement and comfort care. Pt's daughter and pt's wife in agreement with transition from hospital to Hospice home in [facility]. Bed was offered they did accept this offer.   We have ordered the medication gtt and Pt will be able to transfer today at 500pm--  Notified Isabell and she will arrange ambulance. Webb Silversmith RN

## 2020-07-28 NOTE — TOC Transition Note (Signed)
Transition of Care [facility]) - CM/SW Discharge Note   Patient Details  Name: Harold Mckee MRN: [id number] Date of Birth: 11/27/1946  [facility] of [facility]) CM/SW Contact:  Doy Hutching, LCSW Phone Number: 07/28/2020, 1:04 PM   Clinical Narrative:    CSW notified of completion of consents by pt family for transfer to hospice home w/ [facility] [place] in [place]. CSW has arranged PTAR for their request at 5pm, notified RN Alona Bene via secure chat of this time and need to call report. PTAR papers and DNR signed on chart. MD has completed summary.    Final next level of care: Hospice Medical Facility Barriers to Discharge: Barriers Resolved   Patient Goals and CMS Choice Patient states their goals for this hospitalization and ongoing recovery are:: comfort care CMS Medicare.gov Compare Post Acute Care list provided to:: Patient Represenative (must comment) (daughter) Choice offered to / list presented to : Adult Children  Discharge Placement   Patient chooses bed at: Other - please specify in the comment section below: ([facility] [place]) Patient to be transferred to facility by: PTAR Name of family member notified: pt daughter and wife aware per [facility] with Hospice regarding 5pm pick up Patient and family notified of of transfer: 07/28/20  Discharge Plan and Services Discharge Planning Services: CM Consult Post Acute Care Choice: Hospice           Readmission Risk Interventions No flowsheet data found.

## 2020-07-28 NOTE — Progress Notes (Signed)
Report called to [facility]. Questions answered. Daughter in earlier to complete paperwork. Patient prepared for discharge.

## 2020-07-28 NOTE — Progress Notes (Signed)
Report was called to [facility]. Patient escorted via PTAR. An After Visit Summary was printed and given to the PTAR.

## 2020-07-28 NOTE — Social Work (Signed)
Clinical Social Worker facilitated patient discharge including contacting patient family and facility to confirm patient discharge plans.  Clinical information faxed to facility and family agreeable with plan.  CSW arranged ambulance transport via PT[facility] to [facility]) RN to call [phone]  with report prior to discharge.  Clinical Social Worker will sign off for now as social work intervention is no longer needed. Please consult [place] again if new need arises.  Octavio Graves, MSW, LCSW Clinical Social Worker

## 2020-07-28 NOTE — Progress Notes (Signed)
Office Visit Note   Patient: Bobby Maxwell.           Date of Birth: 24-May-1946           MRN: [id number] Visit Date: 07/26/2020 Requested by: Maurice Small, MD [address],  [place] [address] PCP: Maurice Small, MD  Subjective: Chief Complaint  Patient presents with  . Right Hip - Pain    HPI: Bobby Maxwell is a patient with right hip pain.  Is been bothering him for about a month.  Worse over the last several weeks.  He has tried Mobic and muscle relaxer without relief.  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  Denies any history of injury.  Does describe groin pain with ambulation with some radiation down the anterior aspect of his right leg.  Takes Metformin for diabetes.  He wants to work another year before having any type of intervention.  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              ROS: All systems reviewed are negative as they relate to the chief complaint within the history of present illness.  Patient denies  fevers or chills.   Assessment & Plan: Visit Diagnoses:  1. Pain in right hip     Plan: Impression is right hip arthritis.  Been going on for a month.  I think he would do well to get a series of guided hip injections 2-3 times prior to hip replacement.  We may be able to get him through another year without having to have surgery.  Follow-up with Dr. Junius Roads or Dr. Zannie Kehr for hip injection.  Follow-Up Instructions: No follow-ups on file.   Orders:  Orders Placed This Encounter  Procedures  . XR HIP UNILAT W OR W/O PELVIS 2-3 VIEWS RIGHT   No orders of the defined types were placed in this encounter.     Procedures: No procedures performed   Clinical Data: No additional findings.  Objective: Vital Signs: Ht 5\' 8"  (1.727 m)   Wt 203 lb (92.1 kg)   BMI 30.87 kg/m   Physical Exam:   Constitutional: Patient appears well-developed HEENT:  Head: Normocephalic Eyes:EOM are  normal Neck: Normal range of motion Cardiovascular: Normal rate Pulmonary/chest: Effort normal Neurologic: Patient is alert Skin: Skin is warm Psychiatric: Patient has normal mood and affect    Ortho Exam: Ortho exam demonstrates slightly Trendelenburg gait to the right.  Does have some hip pain with circumduction of the right hip not the left hip.  Hip flexion strength intact.  No nerve root tension signs.  Groin pain on the right is present with internal/external rotation of the hip.  Knee bends well on the right-hand side with no effusion.  No definite paresthesias L1 S1 bilaterally.  Specialty Comments:  No specialty comments available.  Imaging: No results found.   PMFS History: Patient Active Problem List   Diagnosis Date Noted  . Personal history of colonic polyps 10/15/2018  . 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 Pain in right wrist 01/23/2017  . 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 Arthritis   . Diabetes mellitus without complication ([place])   . GERD (gastroesophageal reflux disease)   . Hypertension     Family History  Problem Relation Age of Onset  . Diabetes Mother   . Cancer Brother     Past Surgical History:  Procedure Laterality Date  . BACK SURGERY  1980's   lower  . COLONOSCOPY N/A 07/09/2014   Procedure: COLONOSCOPY;  Surgeon: Lear Ng, MD;  Location: [facility]COPY;  Service: Endoscopy;  Laterality: N/A;  . COLONOSCOPY WITH PROPOFOL N/A 10/15/2018   Procedure: COLONOSCOPY WITH PROPOFOL;  Surgeon: Wilford Corner, MD;  Location: [facility] ENDOSCOPY;  Service: Endoscopy;  Laterality: N/A;  . HERNIA REPAIR  40 yrs ago  . HOT HEMOSTASIS N/A 07/09/2014   Procedure: HOT HEMOSTASIS (ARGON PLASMA COAGULATION/BICAP);  Surgeon: Lear Ng, MD;  Location: [facility];  Service: Endoscopy;  Laterality: N/A;  . POLYPECTOMY  10/15/2018   Procedure: POLYPECTOMY;  Surgeon: Wilford Corner, MD;  Location: [facility] ENDOSCOPY;   Service: Endoscopy;;  . REPLACEMENT TOTAL KNEE BILATERAL    . TOTAL KNEE ARTHROPLASTY Bilateral    Social History   Occupational History  . Not on file  Tobacco Use  . Smoking status: Former Research scientist (life sciences)  . Smokeless tobacco: Current User    Types: Snuff  Vaping Use  . Vaping Use: Never used  Substance and Sexual Activity  . Alcohol use: Yes    Comment: rare use  . Drug use: No  . Sexual activity: Yes

## 2020-07-30 ENCOUNTER — Ambulatory Visit: Payer: Self-pay

## 2020-07-30 ENCOUNTER — Encounter: Payer: Self-pay | Admitting: Family Medicine

## 2020-07-30 ENCOUNTER — Ambulatory Visit (INDEPENDENT_AMBULATORY_CARE_PROVIDER_SITE_OTHER): Payer: Medicare HMO | Admitting: Family Medicine

## 2020-07-30 ENCOUNTER — Other Ambulatory Visit: Payer: Self-pay

## 2020-07-30 DIAGNOSIS — Z515 Encounter for palliative care: Secondary | ICD-10-CM

## 2020-07-30 DIAGNOSIS — M25551 Pain in right hip: Secondary | ICD-10-CM

## 2020-07-30 NOTE — Progress Notes (Signed)
Subjective: Patient is here for ultrasound-guided intra-articular right hip injection.   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  Objective: Pain and very decreased range of motion with passive flexion and internal rotation.  Procedure: Ultrasound-guided right hip injection: After sterile prep with Betadine, injected 8 cc 1% lidocaine without epinephrine and 40 mg methylprednisolone using a 22-gauge spinal needle, passing the needle through the iliofemoral ligament into the femoral head/neck junction.  Injectate was seen filling the joint capsule.  Good immediate relief.

## 2020-08-30 ENCOUNTER — Ambulatory Visit: Payer: Medicare HMO | Admitting: Orthopedic Surgery

## 2020-08-30 DIAGNOSIS — M1611 Unilateral primary osteoarthritis, right hip: Secondary | ICD-10-CM | POA: Diagnosis not present

## 2020-08-30 MED ORDER — GABAPENTIN 100 MG PO CAPS: 100.0000 mg | ORAL_CAPSULE | Freq: Every evening | ORAL | 0 refills | Status: DC | PRN

## 2020-08-30 MED ORDER — TRAMADOL HCL 50 MG PO TABS: 50.0000 mg | ORAL_TABLET | Freq: Every evening | ORAL | 0 refills | Status: DC | PRN

## 2020-08-31 ENCOUNTER — Telehealth: Payer: Self-pay | Admitting: Surgical

## 2020-08-31 NOTE — Telephone Encounter (Signed)
See below. Insurance only covered 5 tablets based off of directions on ultram rx. Patient asking if this can be changed?

## 2020-08-31 NOTE — Telephone Encounter (Signed)
Sure, what do the instructions need to say for it to be covered? Just daily prn?

## 2020-08-31 NOTE — Telephone Encounter (Signed)
Received call from patient he advised he need Rx refilled (Tramadol) Patient only received 5 tablets. Patient uses Paediatric nurse at [facility]. The number to contact patient is [phone]

## 2020-09-01 NOTE — Telephone Encounter (Signed)
No his insurance will only cover 7 day supply

## 2020-09-02 ENCOUNTER — Telehealth: Payer: Self-pay | Admitting: Orthopedic Surgery

## 2020-09-02 NOTE — Telephone Encounter (Signed)
Did not see it in system. Sending back as reminder to submit. Patient aware this will be done.

## 2020-09-02 NOTE — Telephone Encounter (Signed)
Got it, I can send in 7 day supply

## 2020-09-02 NOTE — Telephone Encounter (Signed)
Patient called requesting a refill of tramadol. Please send to [facility] on [address]. Please call patient when prescription is sent in. Patient phone number is [phone]

## 2020-09-02 NOTE — Telephone Encounter (Signed)
Please advise thanks.

## 2020-09-03 ENCOUNTER — Other Ambulatory Visit: Payer: Self-pay | Admitting: Surgical

## 2020-09-03 MED ORDER — TRAMADOL HCL 50 MG PO TABS: 50.0000 mg | ORAL_TABLET | Freq: Every day | ORAL | 0 refills | Status: DC | PRN

## 2020-09-03 NOTE — Telephone Encounter (Signed)
Submitted 7 day supply

## 2020-09-03 NOTE — Telephone Encounter (Signed)
Advised patient done.

## 2020-09-06 ENCOUNTER — Encounter: Payer: Self-pay | Admitting: Orthopedic Surgery

## 2020-09-06 NOTE — Progress Notes (Signed)
Office Visit Note   Patient: Bobby Maxwell.           Date of Birth: 1946-07-29           MRN: [id number] Visit Date: 08/30/2020 Requested by: Maurice Small, MD [place] [place],  [place] [id number] PCP: Maurice Small, MD  Subjective: Chief Complaint  Patient presents with  . Right Hip - Pain    HPI: Bobby Maxwell. is a 74 y.o. male who presents to the office complaining of right hip pain.  Patient complains of right groin pain that radiates down the anterior thigh.  He drives a truck for living.  He had a intra-articular hip injection by Dr. Junius Roads on 07/30/2020 that provided 60% relief for the first day but wore off afterward and did not provide lasting relief.  He notes pain is worse with weightbearing.  Denies any low back pain or pain originating from his knee.  He has had bilateral total knee arthroplasty previously by Dr. Marlou Sa.  He requests total hip arthroplasty.  He does not take any blood thinners.  Denies any cardiac history, CKD, blood clots.  He does have a history of diabetes for which he takes Metformin and glipizide.  He has good social support at home as he lives with his niece and his girlfriend is planning on moving in with him soon..                ROS: All systems reviewed are negative as they relate to the chief complaint within the history of present illness.  Patient denies fevers or chills.  Assessment & Plan: Visit Diagnoses:  1. Unilateral primary osteoarthritis, right hip     Plan: Patient is a 74 year old male who presents complaining of right hip pain.  He has a history of right hip osteoarthritis.  He had a intra-articular hip injection that provided excellent relief but relief only lasted for 1 to 2 days.  He wishes to proceed with total hip arthroplasty.  Discussed the risks and benefits of the procedure as well as the recovery timeline.  After lengthy discussion, patient wishes to proceed.  Plan to postpone surgery  until November due to recent cortisone injection into the hip.  In the meantime, prescribe tramadol and gabapentin to help with patient's pain.  Cautioned against driving or operating machinery while taking narcotic medication.  Patient will be posted for early November.  Risk and benefits of hip replacement are discussed including but not limited to infection nerve vessel damage leg length inequality potential need for revision surgery as well as the nature of the postoperative recovery.  All questions answered.  Follow-Up Instructions: No follow-ups on file.   Orders:  No orders of the defined types were placed in this encounter.  Meds ordered this encounter  Medications  . DISCONTD: traMADol (ULTRAM) 50 MG tablet    Sig: Take 1 tablet (50 mg total) by mouth at bedtime as needed.    Dispense:  30 tablet    Refill:  0  . gabapentin (NEURONTIN) 100 MG capsule    Sig: Take 1 capsule (100 mg total) by mouth at bedtime as needed.    Dispense:  30 capsule    Refill:  0      Procedures: No procedures performed   Clinical Data: No additional findings.  Objective: Vital Signs: There were no vitals taken for this visit.  Physical Exam:  Constitutional: Patient appears well-developed HEENT:  Head: Normocephalic  Eyes:EOM are normal Neck: Normal range of motion Cardiovascular: Normal rate Pulmonary/chest: Effort normal Neurologic: Patient is alert Skin: Skin is warm Psychiatric: Patient has normal mood and affect  Ortho Exam: Ortho exam demonstrates right hip with decreased passive internal rotation.  Significant groin pain elicited with internal rotation/external rotation of the right hip joint.  Positive Stinchfield exam.  Pain with terminal hip flexion.  No significant tenderness over the greater trochanter of the right hip.  Negative straight leg raise.  No pain with passive range of motion of the right knee.  No knee effusion is present on the right knee.  Specialty Comments:    No specialty comments available.  Imaging: No results found.   PMFS History: Patient Active Problem List   Diagnosis Date Noted  . Diverticulosis 10/17/2019  . 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 Benign prostatic hyperplasia 10/03/2019  . Hypertension associated with diabetes ([place]) 10/03/2019  . Type 2 diabetes mellitus without complication, without long-term current use of insulin ([place]) 10/03/2019  . Personal history of colonic polyps 10/15/2018  . 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 Pain in right wrist 01/23/2017  . 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 Arthritis   . Diabetes mellitus without complication ([place])   . GERD (gastroesophageal reflux disease)   . Hypertension     Family History  Problem Relation Age of Onset  . Diabetes Mother   . Cancer Brother     Past Surgical History:  Procedure Laterality Date  . BACK SURGERY  1980's   lower  . COLONOSCOPY N/A 07/09/2014   Procedure: COLONOSCOPY;  Surgeon: Lear Ng, MD;  Location: [facility];  Service: Endoscopy;  Laterality: N/A;  . COLONOSCOPY WITH PROPOFOL N/A 10/15/2018   Procedure: COLONOSCOPY WITH PROPOFOL;  Surgeon: Wilford Corner, MD;  Location: [facility];  Service: Endoscopy;  Laterality: N/A;  . HERNIA REPAIR  40 yrs ago  . HOT HEMOSTASIS N/A 07/09/2014   Procedure: HOT HEMOSTASIS (ARGON PLASMA COAGULATION/BICAP);  Surgeon: Lear Ng, MD;  Location: [facility];  Service: Endoscopy;  Laterality: N/A;  . POLYPECTOMY  10/15/2018   Procedure: POLYPECTOMY;  Surgeon: Wilford Corner, MD;  Location: [facility];  Service: Endoscopy;;  . REPLACEMENT TOTAL KNEE BILATERAL    . TOTAL KNEE ARTHROPLASTY Bilateral    Social History   Occupational History  . Not on file  Tobacco Use  . Smoking status: Former Research scientist (life sciences)  . Smokeless tobacco: Current User    Types:  Snuff  Vaping Use  . Vaping Use: Never used  Substance and Sexual Activity  . Alcohol use: Yes    Comment: rare use  . Drug use: No  . Sexual activity: Yes

## 2020-09-13 ENCOUNTER — Encounter: Payer: Self-pay | Admitting: Orthopedic Surgery

## 2020-10-13 ENCOUNTER — Other Ambulatory Visit: Payer: Self-pay

## 2020-10-19 NOTE — Progress Notes (Addendum)
Your procedure is scheduled on Tuesday 10/26/20.  Report to [facility] Main Entrance "A" at 0530 A.M., and check in at the Admitting office.  Call this number if you have problems the morning of surgery: [phone]  Call [phone] if you have any questions prior to your surgery date Monday-Friday 8am-4pm   Remember: Do not eat after midnight the night before your surgery  You may drink clear liquids until 0430 the morning of your surgery.   Clear liquids allowed are: Water, Non-Citrus Juices (without pulp), Carbonated Beverages, Clear Tea, Black Coffee Only, and Gatorade  Please complete your PRE-SURGERY WATER that was provided to you by 0430 the morning of your surgery.  Please, if able, drink it in one setting. DO NOT SIP.   Take these medicines the morning of surgery with A SIP OF WATER: loratadine (CLARITIN) if needed   Follow your surgeon's instructions on when to stop Aspirin.  If no instructions were given by your surgeon then you will need to call the office to get those instructions.    As of today, STOP taking any Aleve, Naproxen, Ibuprofen, Motrin, Advil, Goody's, BC's, all herbal medications, fish oil, and all vitamins.   WHAT DO I DO ABOUT MY DIABETES MEDICATION?   glipiZIDE (GLUCOTROL XL) Day before surgery: take AM dose as usual, no bedtime dose Day of surgery: NONE  metFORMIN (GLUCOPHAGE)  Day before surgery: take as usual Day of surgery: NONE  Do not take any oral diabetic medications morning of surgery   HOW TO MANAGE YOUR DIABETES BEFORE AND AFTER SURGERY  Why is it important to control my blood sugar before and after surgery?  Improving blood sugar levels before and after surgery helps healing and can limit problems.  A way of improving blood sugar control is eating a healthy diet by: o  Eating less sugar and carbohydrates o  Increasing activity/exercise o  Talking with your doctor about reaching your blood sugar goals  High blood sugars  (greater than 180 mg/dL) can raise your risk of infections and slow your recovery, so you will need to focus on controlling your diabetes during the weeks before surgery.  Make sure that the doctor who takes care of your diabetes knows about your planned surgery including the date and location.  How do I manage my blood sugar before surgery?  Check your blood sugar at least 4 times a day, starting 2 days before surgery, to make sure that the level is not too high or low.  Check your blood sugar the morning of your surgery when you wake up and every 2 hours until you get to the Short Stay unit. o If your blood sugar is less than 70 mg/dL, you will need to treat for low blood sugar: - Do not take insulin. - Treat a low blood sugar (less than 70 mg/dL) with  cup of clear juice (cranberry or apple), 4 glucose tablets, OR glucose gel. - Recheck blood sugar in 15 minutes after treatment (to make sure it is greater than 70 mg/dL). If your blood sugar is not greater than 70 mg/dL on recheck, call [phone] for further instructions.  Report your blood sugar to the short stay nurse when you get to Short Stay.   If you are admitted to the hospital after surgery: o Your blood sugar will be checked by the staff and you will probably be given insulin after surgery (instead of oral diabetes medicines) to make sure you have good blood sugar levels.  o The goal for blood sugar control after surgery is 80-180 mg/dL.    The Morning of Surgery  Do not wear jewelry  Do not wear lotions, powders, colognes, or deodorant  Men may shave face and neck.  Do not bring valuables to the hospital.  [facility] is not responsible for any belongings or valuables.  If you are a smoker, DO NOT Smoke 24 hours prior to surgery  If you wear a CPAP at night please bring your mask the morning of surgery   Remember that you must have someone to transport you home after your surgery, and remain with you for 24 hours if  you are discharged the same day.   Please bring cases for contacts, glasses, hearing aids, dentures or bridgework because it cannot be worn into surgery.    Leave your suitcase in the car.  After surgery it may be brought to your room.  For patients admitted to the hospital, discharge time will be determined by your treatment team.  Patients discharged the day of surgery will not be allowed to drive home.    Special instructions:   [facility]- Preparing For Surgery  Before surgery, you can play an important role. Because skin is not sterile, your skin needs to be as free of germs as possible. You can reduce the number of germs on your skin by washing with CHG (chlorahexidine gluconate) Soap before surgery.  CHG is an antiseptic cleaner which kills germs and bonds with the skin to continue killing germs even after washing.    Oral Hygiene is also important to reduce your risk of infection.  Remember - BRUSH YOUR TEETH THE MORNING OF SURGERY WITH YOUR REGULAR TOOTHPASTE  Please do not use if you have an allergy to CHG or antibacterial soaps. If your skin becomes reddened/irritated stop using the CHG.  Do not shave (including legs and underarms) for at least 48 hours prior to first CHG shower. It is OK to shave your face.  Please follow these instructions carefully.   1. Shower the NIGHT BEFORE SURGERY and the MORNING OF SURGERY with CHG Soap.   2. If you chose to wash your hair and body, wash as usual with your normal shampoo and body-wash/soap.  3. Rinse your hair and body thoroughly to remove the shampoo and soap.  4. Apply CHG directly to the skin (ONLY FROM THE NECK DOWN) and wash gently with a scrungie or a clean washcloth.   5. Do not use on open wounds or open sores. Avoid contact with your eyes, ears, mouth and genitals (private parts). Wash Face and genitals (private parts)  with your normal soap.   6. Wash thoroughly, paying special attention to the area where your surgery  will be performed.  7. Thoroughly rinse your body with warm water from the neck down.  8. DO NOT shower/wash with your normal soap after using and rinsing off the CHG Soap.  9. Pat yourself dry with a CLEAN TOWEL.  10. Wear CLEAN PAJAMAS to bed the night before surgery  11. Place CLEAN SHEETS on your bed the night of your first shower and DO NOT SLEEP WITH PETS.  12. Wear comfortable clothes the morning of surgery.     Day of Surgery:  Please shower the morning of surgery with the CHG soap Do not apply any deodorants/lotions. Please wear clean clothes to the hospital/surgery center.   Remember to brush your teeth WITH YOUR REGULAR TOOTHPASTE.   Please read over  the following fact sheets that you were given.

## 2020-10-20 ENCOUNTER — Encounter (HOSPITAL_COMMUNITY)
Admission: RE | Admit: 2020-10-20 | Discharge: 2020-10-20 | Disposition: A | Discharge status: Home / Self Care | Payer: Medicare HMO | Source: Ambulatory Visit | Attending: Orthopedic Surgery | Admitting: Orthopedic Surgery

## 2020-10-20 ENCOUNTER — Encounter (HOSPITAL_COMMUNITY): Payer: Self-pay

## 2020-10-20 ENCOUNTER — Other Ambulatory Visit: Payer: Self-pay

## 2020-10-20 DIAGNOSIS — Z01818 Encounter for other preprocedural examination: Secondary | ICD-10-CM | POA: Diagnosis present

## 2020-10-20 LAB — URINALYSIS, ROUTINE W REFLEX MICROSCOPIC
Bilirubin Urine: NEGATIVE
Glucose, UA: 50 mg/dL — AB
Hgb urine dipstick: NEGATIVE
Ketones, ur: NEGATIVE mg/dL
Leukocytes,Ua: NEGATIVE
Nitrite: NEGATIVE
Protein, ur: NEGATIVE mg/dL
Specific Gravity, Urine: 1.015 (ref 1.005–1.030)
pH: 5 (ref 5.0–8.0)

## 2020-10-20 LAB — BASIC METABOLIC PANEL
Anion gap: 11 (ref 5–15)
BUN: 12 mg/dL (ref 8–23)
CO2: 22 mmol/L (ref 22–32)
Calcium: 8.8 mg/dL — ABNORMAL LOW (ref 8.9–10.3)
Chloride: 103 mmol/L (ref 98–111)
Creatinine, Ser: 1.01 mg/dL (ref 0.61–1.24)
GFR, Estimated: >60 mL/min (ref >60)
Glucose, Bld: 198 mg/dL — ABNORMAL HIGH (ref 70–99)
Potassium: 3.2 mmol/L — ABNORMAL LOW (ref 3.5–5.1)
Sodium: 136 mmol/L (ref 135–145)

## 2020-10-20 LAB — CBC
HCT: 42.7 % (ref 39.0–52.0)
Hemoglobin: 14.4 g/dL (ref 13.0–17.0)
MCH: 31.6 pg (ref 26.0–34.0)
MCHC: 33.7 g/dL (ref 30.0–36.0)
MCV: 93.6 fL (ref 80.0–100.0)
Platelets: 258 10*3/uL (ref 150–400)
RBC: 4.56 MIL/uL (ref 4.22–5.81)
RDW: 12.6 % (ref 11.5–15.5)
WBC: 7.7 10*3/uL (ref 4.0–10.5)
nRBC: 0 % (ref 0.0–0.2)

## 2020-10-20 LAB — GLUCOSE, CAPILLARY: Glucose-Capillary: 198 mg/dL — ABNORMAL HIGH (ref 70–99)

## 2020-10-20 LAB — SURGICAL PCR SCREEN
MRSA, PCR: NEGATIVE
Staphylococcus aureus: NEGATIVE

## 2020-10-20 NOTE — Progress Notes (Addendum)
Called patient and found that they thought their appt time was 3pm.  Bobby Maxwell he would be here in 20 min.   1440 Patient arrived to PAT appt

## 2020-10-20 NOTE — Progress Notes (Signed)
PCP - Dr. Maudie Mercury with [facility] in [place] - Denies  Chest x-ray - Not indicated  EKG - 10/20/20 Stress Test - Denies ECHO - Denies Cardiac Cath - Denies  Sleep Study - Denies  DM - Type I CBG 198 at PAT appt Fasting Blood Sugar - 100-125 Checks Blood Sugar dailey  Aspirin Instructions: Low dose aspirin patient will call to verify when to stop aspirin  ERAS Protcol - Yes PRE-SURGERY water given   COVID TEST- 10/22/20   Anesthesia review: No  Patient denies shortness of breath, fever, cough and chest pain at PAT appointment   All instructions explained to the patient, with a verbal understanding of the material. Patient agrees to go over the instructions while at home for a better understanding. Patient also instructed to self quarantine after being tested for COVID-19. The opportunity to ask questions was provided.

## 2020-10-22 ENCOUNTER — Other Ambulatory Visit (HOSPITAL_COMMUNITY)
Admission: RE | Admit: 2020-10-22 | Discharge: 2020-10-22 | Disposition: A | Discharge status: Home / Self Care | Payer: Medicare HMO | Source: Ambulatory Visit | Attending: Orthopedic Surgery | Admitting: Orthopedic Surgery

## 2020-10-22 DIAGNOSIS — Z20822 Contact with and (suspected) exposure to covid-19: Secondary | ICD-10-CM | POA: Insufficient documentation

## 2020-10-22 DIAGNOSIS — Z01812 Encounter for preprocedural laboratory examination: Secondary | ICD-10-CM | POA: Diagnosis present

## 2020-10-22 LAB — URINE CULTURE: Culture: NO GROWTH

## 2020-10-22 LAB — SARS CORONAVIRUS 2 (TAT 6-24 HRS): SARS Coronavirus 2: NEGATIVE

## 2020-10-25 NOTE — Anesthesia Preprocedure Evaluation (Addendum)
Anesthesia Evaluation  Patient identified by MRN, date of birth, ID band Patient awake    Reviewed: Allergy & Precautions, H&P , NPO status , Patient's Chart, lab work & pertinent test results  History of Anesthesia Complications Negative for: history of anesthetic complications  Airway Mallampati: II  TM Distance: >3 FB Neck ROM: Full    Dental no notable dental hx. (+) Dental Advisory Given   Pulmonary neg pulmonary ROS, former smoker,    Pulmonary exam normal        Cardiovascular hypertension, Pt. on medications Normal cardiovascular exam     Neuro/Psych negative neurological ROS  negative psych ROS   GI/Hepatic Neg liver ROS, GERD  ,  Endo/Other  diabetes, Type 2, Oral Hypoglycemic Agents  Renal/GU negative Renal ROS  negative genitourinary   Musculoskeletal negative musculoskeletal ROS (+)   Abdominal   Peds negative pediatric ROS (+)  Hematology negative hematology ROS (+)   Anesthesia Other Findings   Reproductive/Obstetrics negative OB ROS                            Anesthesia Physical  Anesthesia Plan  ASA: III  Anesthesia Plan: Spinal   Post-op Pain Management:    Induction:   PONV Risk Score and Plan: 2 and Ondansetron and Dexamethasone  Airway Management Planned: Natural Airway  Additional Equipment:   Intra-op Plan:   Post-operative Plan:   Informed Consent: I have reviewed the patients History and Physical, chart, labs and discussed the procedure including the risks, benefits and alternatives for the proposed anesthesia with the patient or authorized representative who has indicated his/her understanding and acceptance.     Dental advisory given  Plan Discussed with: Anesthesiologist and CRNA  Anesthesia Plan Comments:        Anesthesia Quick Evaluation

## 2020-10-26 ENCOUNTER — Ambulatory Visit (HOSPITAL_COMMUNITY): Payer: Medicare HMO | Admitting: Anesthesiology

## 2020-10-26 ENCOUNTER — Encounter (HOSPITAL_COMMUNITY)
Admission: RE | Disposition: A | Discharge status: Home / Self Care | Payer: Self-pay | Source: Home / Self Care | Attending: Orthopedic Surgery

## 2020-10-26 ENCOUNTER — Ambulatory Visit (HOSPITAL_COMMUNITY): Payer: Medicare HMO

## 2020-10-26 ENCOUNTER — Encounter (HOSPITAL_COMMUNITY): Payer: Self-pay | Admitting: Orthopedic Surgery

## 2020-10-26 ENCOUNTER — Observation Stay (HOSPITAL_COMMUNITY): Payer: Medicare HMO

## 2020-10-26 ENCOUNTER — Observation Stay (HOSPITAL_COMMUNITY)
Admission: RE | Admit: 2020-10-26 | Discharge: 2020-10-28 | Disposition: A | Discharge status: Home / Self Care | Payer: Medicare HMO | Attending: Orthopedic Surgery | Admitting: Orthopedic Surgery

## 2020-10-26 ENCOUNTER — Other Ambulatory Visit: Payer: Self-pay

## 2020-10-26 DIAGNOSIS — Z85038 Personal history of other malignant neoplasm of large intestine: Secondary | ICD-10-CM | POA: Diagnosis not present

## 2020-10-26 DIAGNOSIS — E119 Type 2 diabetes mellitus without complications: Secondary | ICD-10-CM | POA: Insufficient documentation

## 2020-10-26 DIAGNOSIS — M1611 Unilateral primary osteoarthritis, right hip: Secondary | ICD-10-CM | POA: Diagnosis not present

## 2020-10-26 DIAGNOSIS — M25551 Pain in right hip: Secondary | ICD-10-CM | POA: Diagnosis present

## 2020-10-26 DIAGNOSIS — Z96653 Presence of artificial knee joint, bilateral: Secondary | ICD-10-CM | POA: Diagnosis not present

## 2020-10-26 DIAGNOSIS — Z419 Encounter for procedure for purposes other than remedying health state, unspecified: Secondary | ICD-10-CM

## 2020-10-26 DIAGNOSIS — Z87891 Personal history of nicotine dependence: Secondary | ICD-10-CM | POA: Insufficient documentation

## 2020-10-26 DIAGNOSIS — I10 Essential (primary) hypertension: Secondary | ICD-10-CM | POA: Insufficient documentation

## 2020-10-26 DIAGNOSIS — Z96641 Presence of right artificial hip joint: Secondary | ICD-10-CM

## 2020-10-26 HISTORY — PX: TOTAL HIP ARTHROPLASTY: SHX124

## 2020-10-26 LAB — GLUCOSE, CAPILLARY
Glucose-Capillary: 182 mg/dL — ABNORMAL HIGH (ref 70–99)
Glucose-Capillary: 121 mg/dL — ABNORMAL HIGH (ref 70–99)
Glucose-Capillary: 100 mg/dL — ABNORMAL HIGH (ref 70–99)
Glucose-Capillary: 190 mg/dL — ABNORMAL HIGH (ref 70–99)
Glucose-Capillary: 194 mg/dL — ABNORMAL HIGH (ref 70–99)

## 2020-10-26 LAB — HEMOGLOBIN A1C
Hgb A1c MFr Bld: 7.3 % — ABNORMAL HIGH (ref 4.8–5.6)
Mean Plasma Glucose: 162.81 mg/dL

## 2020-10-26 SURGERY — ARTHROPLASTY, HIP, TOTAL, ANTERIOR APPROACH
Anesthesia: Spinal | Site: Hip | Laterality: Right

## 2020-10-26 MED ORDER — ORAL CARE MOUTH RINSE: 15.0000 mL | Freq: Once | OROMUCOSAL | Status: AC

## 2020-10-26 MED ORDER — PROPOFOL 500 MG/50ML IV EMUL
INTRAVENOUS | Status: DC | PRN
  Administered 2020-10-26: 50 ug/kg/min via INTRAVENOUS

## 2020-10-26 MED ORDER — CHLORHEXIDINE GLUCONATE 0.12 % MT SOLN: 15.0000 mL | Freq: Once | OROMUCOSAL | Status: AC

## 2020-10-26 MED ORDER — METOCLOPRAMIDE HCL 5 MG/ML IJ SOLN: 5.0000 mg | Freq: Three times a day (TID) | INTRAMUSCULAR | Status: DC | PRN

## 2020-10-26 MED ORDER — PROMETHAZINE HCL 25 MG/ML IJ SOLN: 6.2500 mg | INTRAMUSCULAR | Status: DC | PRN

## 2020-10-26 MED ORDER — PROPOFOL 10 MG/ML IV BOLUS
INTRAVENOUS | Status: AC
  Filled 2020-10-26: qty 20

## 2020-10-26 MED ORDER — ACETAMINOPHEN 500 MG PO TABS
1000.0000 mg | ORAL_TABLET | Freq: Four times a day (QID) | ORAL | Status: AC
  Administered 2020-10-26 – 2020-10-27 (×4): 1000 mg via ORAL
  Filled 2020-10-26 (×4): qty 2

## 2020-10-26 MED ORDER — VANCOMYCIN HCL 1000 MG IV SOLR
INTRAVENOUS | Status: DC | PRN
  Administered 2020-10-26: 1000 mg via TOPICAL

## 2020-10-26 MED ORDER — ONDANSETRON HCL 4 MG/2ML IJ SOLN
INTRAMUSCULAR | Status: DC | PRN
  Administered 2020-10-26: 4 mg via INTRAVENOUS

## 2020-10-26 MED ORDER — METHOCARBAMOL 1000 MG/10ML IJ SOLN
500.0000 mg | Freq: Four times a day (QID) | INTRAVENOUS | Status: DC | PRN
  Filled 2020-10-26: qty 5

## 2020-10-26 MED ORDER — HYDROMORPHONE HCL 1 MG/ML IJ SOLN: 0.5000 mg | INTRAMUSCULAR | Status: DC | PRN

## 2020-10-26 MED ORDER — LISINOPRIL 20 MG PO TABS
20.0000 mg | ORAL_TABLET | Freq: Every day | ORAL | Status: DC
  Administered 2020-10-27 – 2020-10-28 (×2): 20 mg via ORAL
  Filled 2020-10-26 (×3): qty 1

## 2020-10-26 MED ORDER — FENTANYL CITRATE (PF) 100 MCG/2ML IJ SOLN: 25.0000 ug | INTRAMUSCULAR | Status: DC | PRN

## 2020-10-26 MED ORDER — POVIDONE-IODINE 10 % EX SWAB: 2.0000 "application " | Freq: Once | CUTANEOUS | Status: DC

## 2020-10-26 MED ORDER — IRRISEPT - 450ML BOTTLE WITH 0.05% CHG IN STERILE WATER, USP 99.95% OPTIME
TOPICAL | Status: DC | PRN
  Administered 2020-10-26: 450 mL

## 2020-10-26 MED ORDER — MIDAZOLAM HCL 2 MG/2ML IJ SOLN
INTRAMUSCULAR | Status: AC
  Filled 2020-10-26: qty 2

## 2020-10-26 MED ORDER — METOCLOPRAMIDE HCL 5 MG PO TABS: 5.0000 mg | ORAL_TABLET | Freq: Three times a day (TID) | ORAL | Status: DC | PRN

## 2020-10-26 MED ORDER — INSULIN ASPART 100 UNIT/ML ~~LOC~~ SOLN: 0.0000 [IU] | Freq: Every day | SUBCUTANEOUS | Status: DC

## 2020-10-26 MED ORDER — BUPIVACAINE HCL (PF) 0.25 % IJ SOLN
INTRAMUSCULAR | Status: DC | PRN
  Administered 2020-10-26: 30 mL

## 2020-10-26 MED ORDER — DOCUSATE SODIUM 100 MG PO CAPS
100.0000 mg | ORAL_CAPSULE | Freq: Two times a day (BID) | ORAL | Status: DC
  Administered 2020-10-26 – 2020-10-28 (×4): 100 mg via ORAL
  Filled 2020-10-26 (×4): qty 1

## 2020-10-26 MED ORDER — ALBUMIN HUMAN 5 % IV SOLN: INTRAVENOUS | Status: DC | PRN

## 2020-10-26 MED ORDER — TRANEXAMIC ACID-NACL 1000-0.7 MG/100ML-% IV SOLN
1000.0000 mg | INTRAVENOUS | Status: AC
  Administered 2020-10-26: 1000 mg via INTRAVENOUS
  Filled 2020-10-26: qty 100

## 2020-10-26 MED ORDER — CLONIDINE HCL (ANALGESIA) 100 MCG/ML EP SOLN
EPIDURAL | Status: AC
  Filled 2020-10-26: qty 10

## 2020-10-26 MED ORDER — ONDANSETRON HCL 4 MG/2ML IJ SOLN
INTRAMUSCULAR | Status: AC
  Filled 2020-10-26: qty 2

## 2020-10-26 MED ORDER — LISINOPRIL-HYDROCHLOROTHIAZIDE 20-12.5 MG PO TABS: 1.0000 | ORAL_TABLET | Freq: Every morning | ORAL | Status: DC

## 2020-10-26 MED ORDER — METHOCARBAMOL 500 MG PO TABS: 500.0000 mg | ORAL_TABLET | Freq: Four times a day (QID) | ORAL | Status: DC | PRN

## 2020-10-26 MED ORDER — CEFAZOLIN SODIUM-DEXTROSE 2-4 GM/100ML-% IV SOLN
2.0000 g | Freq: Three times a day (TID) | INTRAVENOUS | Status: AC
  Administered 2020-10-26 – 2020-10-27 (×3): 2 g via INTRAVENOUS
  Filled 2020-10-26 (×3): qty 100

## 2020-10-26 MED ORDER — ONDANSETRON HCL 4 MG/2ML IJ SOLN: 4.0000 mg | Freq: Four times a day (QID) | INTRAMUSCULAR | Status: DC | PRN

## 2020-10-26 MED ORDER — OXYCODONE HCL 5 MG PO TABS: 5.0000 mg | ORAL_TABLET | ORAL | Status: DC | PRN

## 2020-10-26 MED ORDER — MORPHINE SULFATE (PF) 4 MG/ML IV SOLN
INTRAVENOUS | Status: AC
  Filled 2020-10-26: qty 1

## 2020-10-26 MED ORDER — PROPOFOL 1000 MG/100ML IV EMUL
INTRAVENOUS | Status: AC
  Filled 2020-10-26: qty 100

## 2020-10-26 MED ORDER — SODIUM CHLORIDE 0.9 % IR SOLN
Status: DC | PRN
  Administered 2020-10-26: 3000 mL

## 2020-10-26 MED ORDER — CHLORHEXIDINE GLUCONATE 0.12 % MT SOLN
OROMUCOSAL | Status: AC
  Administered 2020-10-26: 15 mL
  Filled 2020-10-26: qty 15

## 2020-10-26 MED ORDER — MORPHINE SULFATE 4 MG/ML IJ SOLN
INTRAMUSCULAR | Status: DC | PRN
  Administered 2020-10-26: 8 mg via INTRAMUSCULAR

## 2020-10-26 MED ORDER — VANCOMYCIN HCL 1000 MG IV SOLR
INTRAVENOUS | Status: AC
  Filled 2020-10-26: qty 1000

## 2020-10-26 MED ORDER — FENTANYL CITRATE (PF) 250 MCG/5ML IJ SOLN
INTRAMUSCULAR | Status: AC
  Filled 2020-10-26: qty 5

## 2020-10-26 MED ORDER — BUPIVACAINE IN DEXTROSE 0.75-8.25 % IT SOLN
INTRATHECAL | Status: DC | PRN
  Administered 2020-10-26: 2 mL via INTRATHECAL

## 2020-10-26 MED ORDER — CELECOXIB 200 MG PO CAPS
200.0000 mg | ORAL_CAPSULE | Freq: Once | ORAL | Status: AC
  Administered 2020-10-26: 200 mg via ORAL
  Filled 2020-10-26: qty 1

## 2020-10-26 MED ORDER — POVIDONE-IODINE 7.5 % EX SOLN: Freq: Once | CUTANEOUS | Status: DC

## 2020-10-26 MED ORDER — TAMSULOSIN HCL 0.4 MG PO CAPS: 0.4000 mg | ORAL_CAPSULE | Freq: Every day | ORAL | Status: DC

## 2020-10-26 MED ORDER — GABAPENTIN 100 MG PO CAPS
100.0000 mg | ORAL_CAPSULE | Freq: Every day | ORAL | Status: DC
  Administered 2020-10-26: 100 mg via ORAL
  Filled 2020-10-26: qty 1

## 2020-10-26 MED ORDER — ACETAMINOPHEN 500 MG PO TABS
1000.0000 mg | ORAL_TABLET | Freq: Once | ORAL | Status: AC
  Administered 2020-10-26: 1000 mg via ORAL
  Filled 2020-10-26: qty 2

## 2020-10-26 MED ORDER — INSULIN ASPART 100 UNIT/ML ~~LOC~~ SOLN: 0.0000 [IU] | Freq: Three times a day (TID) | SUBCUTANEOUS | Status: DC

## 2020-10-26 MED ORDER — CLONIDINE HCL (ANALGESIA) 100 MCG/ML EP SOLN
EPIDURAL | Status: DC | PRN
  Administered 2020-10-26: 1 mL

## 2020-10-26 MED ORDER — LACTATED RINGERS IV SOLN: INTRAVENOUS | Status: AC

## 2020-10-26 MED ORDER — 0.9 % SODIUM CHLORIDE (POUR BTL) OPTIME
TOPICAL | Status: DC | PRN
  Administered 2020-10-26 (×3): 1000 mL

## 2020-10-26 MED ORDER — HYDROCHLOROTHIAZIDE 12.5 MG PO CAPS
12.5000 mg | ORAL_CAPSULE | Freq: Every day | ORAL | Status: DC
  Administered 2020-10-27 – 2020-10-28 (×2): 12.5 mg via ORAL
  Filled 2020-10-26 (×3): qty 1

## 2020-10-26 MED ORDER — LIDOCAINE 2% (20 MG/ML) 5 ML SYRINGE
INTRAMUSCULAR | Status: AC
  Filled 2020-10-26: qty 5

## 2020-10-26 MED ORDER — ASPIRIN EC 81 MG PO TBEC
81.0000 mg | DELAYED_RELEASE_TABLET | Freq: Every day | ORAL | Status: DC
  Administered 2020-10-26 – 2020-10-28 (×3): 81 mg via ORAL
  Filled 2020-10-26 (×3): qty 1

## 2020-10-26 MED ORDER — GLIPIZIDE ER 10 MG PO TB24
10.0000 mg | ORAL_TABLET | Freq: Every day | ORAL | Status: DC
  Administered 2020-10-27 – 2020-10-28 (×2): 10 mg via ORAL
  Filled 2020-10-26 (×2): qty 1

## 2020-10-26 MED ORDER — BUPIVACAINE HCL (PF) 0.25 % IJ SOLN
INTRAMUSCULAR | Status: AC
  Filled 2020-10-26: qty 30

## 2020-10-26 MED ORDER — ONDANSETRON HCL 4 MG PO TABS: 4.0000 mg | ORAL_TABLET | Freq: Four times a day (QID) | ORAL | Status: DC | PRN

## 2020-10-26 MED ORDER — PROPOFOL 10 MG/ML IV BOLUS
INTRAVENOUS | Status: DC | PRN
  Administered 2020-10-26: 50 mg via INTRAVENOUS
  Administered 2020-10-26: 40 mg via INTRAVENOUS

## 2020-10-26 MED ORDER — MIDAZOLAM HCL 5 MG/5ML IJ SOLN
INTRAMUSCULAR | Status: DC | PRN
  Administered 2020-10-26: 2 mg via INTRAVENOUS

## 2020-10-26 MED ORDER — METFORMIN HCL 500 MG PO TABS
1000.0000 mg | ORAL_TABLET | Freq: Two times a day (BID) | ORAL | Status: DC
  Administered 2020-10-26 – 2020-10-28 (×4): 1000 mg via ORAL
  Filled 2020-10-26 (×4): qty 2

## 2020-10-26 MED ORDER — LIDOCAINE HCL (CARDIAC) PF 100 MG/5ML IV SOSY
PREFILLED_SYRINGE | INTRAVENOUS | Status: DC | PRN
  Administered 2020-10-26: 40 mg via INTRAVENOUS

## 2020-10-26 MED ORDER — CEFAZOLIN SODIUM-DEXTROSE 2-4 GM/100ML-% IV SOLN
2.0000 g | INTRAVENOUS | Status: AC
  Administered 2020-10-26: 2 g via INTRAVENOUS
  Filled 2020-10-26: qty 100

## 2020-10-26 MED ORDER — LACTATED RINGERS IV SOLN: INTRAVENOUS | Status: DC

## 2020-10-26 SURGICAL SUPPLY — 59 items
BAG DECANTER FOR FLEXI CONT (MISCELLANEOUS) IMPLANT
BALL HIP CERAMIC (Hips) ×1 IMPLANT
BLADE CLIPPER SURG (BLADE) ×2 IMPLANT
BLADE SAW SGTL 18X1.27X75 (BLADE) ×2 IMPLANT
CELLS DAT CNTRL 66122 CELL SVR (MISCELLANEOUS) IMPLANT
COVER PERINEAL POST (MISCELLANEOUS) ×2 IMPLANT
COVER SURGICAL LIGHT HANDLE (MISCELLANEOUS) ×2 IMPLANT
COVER WAND RF STERILE (DRAPES) IMPLANT
CUP SECTOR GRIPTON 50MM (Cup) ×2 IMPLANT
DRAPE C-ARM 42X72 X-RAY (DRAPES) ×2 IMPLANT
DRAPE STERI IOBAN 125X83 (DRAPES) ×2 IMPLANT
DRAPE U-SHAPE 47X51 STRL (DRAPES) ×6 IMPLANT
DRSG AQUACEL AG ADV 3.5X10 (GAUZE/BANDAGES/DRESSINGS) ×2 IMPLANT
DURAPREP 26ML APPLICATOR (WOUND CARE) ×2 IMPLANT
ELECT BLADE 4.0 EZ CLEAN MEGAD (MISCELLANEOUS) ×2
ELECT REM PT RETURN 9FT ADLT (ELECTROSURGICAL) ×2
ELECTRODE BLDE 4.0 EZ CLN MEGD (MISCELLANEOUS) ×1 IMPLANT
ELECTRODE REM PT RTRN 9FT ADLT (ELECTROSURGICAL) ×1 IMPLANT
FEM STEM 12/14 TAPER SZ 4 HIP (Orthopedic Implant) ×2 IMPLANT
FEMORAL STEM 12/14 TPR SZ4 HIP (Orthopedic Implant) ×1 IMPLANT
GAUZE SPONGE 4X4 12PLY STRL (GAUZE/BANDAGES/DRESSINGS) ×2 IMPLANT
GLOVE BIOGEL PI IND STRL 8 (GLOVE) ×1 IMPLANT
GLOVE BIOGEL PI INDICATOR 8 (GLOVE) ×1
GLOVE ECLIPSE 8.0 STRL XLNG CF (GLOVE) ×4 IMPLANT
GOWN STRL REUS W/ TWL LRG LVL3 (GOWN DISPOSABLE) ×3 IMPLANT
GOWN STRL REUS W/TWL LRG LVL3 (GOWN DISPOSABLE) ×6
HANDPIECE INTERPULSE COAX TIP (DISPOSABLE) ×2
HIP BALL CERAMIC (Hips) ×2 IMPLANT
HOOD PEEL AWAY FLYTE STAYCOOL (MISCELLANEOUS) ×6 IMPLANT
JET LAVAGE IRRISEPT WOUND (IRRIGATION / IRRIGATOR) ×2
KIT BASIN OR (CUSTOM PROCEDURE TRAY) ×2 IMPLANT
KIT TURNOVER KIT B (KITS) ×2 IMPLANT
LAVAGE JET IRRISEPT WOUND (IRRIGATION / IRRIGATOR) ×1 IMPLANT
LINER ACET PNNCL PLUS4 NEUTRAL (Hips) ×1 IMPLANT
NEEDLE 18GX1X1/2 (RX/OR ONLY) (NEEDLE) ×2 IMPLANT
NEEDLE HYPO 22GX1.5 SAFETY (NEEDLE) ×2 IMPLANT
NS IRRIG 1000ML POUR BTL (IV SOLUTION) ×2 IMPLANT
PACK TOTAL JOINT (CUSTOM PROCEDURE TRAY) ×2 IMPLANT
PAD ARMBOARD 7.5X6 YLW CONV (MISCELLANEOUS) ×2 IMPLANT
PINNACLE PLUS 4 NEUTRAL (Hips) ×2 IMPLANT
RTRCTR WOUND ALEXIS 18CM MED (MISCELLANEOUS)
SCREW 6.5MMX25MM (Screw) ×2 IMPLANT
SET HNDPC FAN SPRY TIP SCT (DISPOSABLE) ×1 IMPLANT
STRIP CLOSURE SKIN 1/2X4 (GAUZE/BANDAGES/DRESSINGS) ×2 IMPLANT
SUT ETHIBOND NAB CT1 #1 30IN (SUTURE) ×6 IMPLANT
SUT MNCRL AB 3-0 PS2 18 (SUTURE) ×4 IMPLANT
SUT VIC AB 0 CT1 27 (SUTURE) ×5
SUT VIC AB 0 CT1 27XBRD ANBCTR (SUTURE) ×5 IMPLANT
SUT VIC AB 1 CT1 27 (SUTURE) ×10
SUT VIC AB 1 CT1 27XBRD ANBCTR (SUTURE) ×5 IMPLANT
SUT VIC AB 1 CT1 36 (SUTURE) ×4 IMPLANT
SUT VIC AB 2-0 CT1 27 (SUTURE) ×4
SUT VIC AB 2-0 CT1 TAPERPNT 27 (SUTURE) ×2 IMPLANT
SYR 10ML LL (SYRINGE) ×2 IMPLANT
SYR 30ML LL (SYRINGE) ×2 IMPLANT
TOWEL GREEN STERILE (TOWEL DISPOSABLE) ×2 IMPLANT
TOWEL GREEN STERILE FF (TOWEL DISPOSABLE) ×2 IMPLANT
TRAY FOLEY MTR SLVR 16FR STAT (SET/KITS/TRAYS/PACK) IMPLANT
WATER STERILE IRR 1000ML POUR (IV SOLUTION) IMPLANT

## 2020-10-26 NOTE — Addendum Note (Signed)
Addendum  created 10/26/20 1550 by Duane Boston, MD   Child order released for a procedure order, Clinical Note Signed, Intraprocedure Blocks edited

## 2020-10-26 NOTE — Op Note (Signed)
NAME: Markos, Luberda [facility] MEDICAL RECORD [id number] ACCOUNT [id number] DATE OF BIRTH:07-30-1946 FACILITY: [facility] LOCATION: [facility]PER[facility] PHYSICIAN:Torian Thoennes Diamantina [place], MD  OPERATIVE REPORT  DATE OF PROCEDURE:  10/26/2020  PREOPERATIVE DIAGNOSIS:  Right hip arthritis.  POSTOPERATIVE DIAGNOSIS:  Right hip arthritis.  PROCEDURE:  Right total hip replacement, anterior approach using DePuy ACTIS stem size 4 with a +5, 32 mm ceramic head, 50 mm Pinnacle press fit cup with +4 liner, high offset neck.  SURGEON:  Cammy Copa, MD  ASSISTANT:  Karenann Cai, PA.  INDICATIONS:  The patient is a 74 year old patient with right hip pain and refractory end-stage right hip arthritis who presents for operative management after explanation of risks and benefits.  PROCEDURE IN DETAIL:  The patient was brought to the operating room where spinal anesthetic was induced.  Preoperative antibiotics was administered.  Timeout was called.  The patient was placed on the Hana bed with the right hip exposed.  Right hip was  prescrubbed with alcohol and Betadine, allowed to air dry, prepped with DuraPrep solution and draped in sterile manner.  Ioban used to cover the operative field.  Timeout was called.  Fluoroscopy was used to confirm adequate position of the patient on  the bed.  An incision was made 1 cm distal and 2 cm posterior to the anterior superior iliac crest.  Skin and subcutaneous tissue were sharply divided.  Tensor fascia lata fascia was then divided and the plane between the tensor fascia lata laterally and  the rectus anteriorly was developed.  Crossing vessels were coagulated using electrocautery, particularly the circumflex vessels.  Retractors were placed at the posterior and anterior, inferior femoral neck region.  Capsule was incised in a T-shaped  fashion and marked with #1 Ethibond suture.  The neck cut was then made and revised x1, under fluoroscopic guidance to about 1 cm above the  lesser trochanter.  Next, the bent 90 retractor was placed and the acetabulum was exposed.  Labrum was  circumferentially excised.  Pulvinar was removed.  Transverse acetabular ligament was released for exposure.  With good exposure present the acetabulum was reamed about 10-15 degrees of anteversion and 45 degrees of abduction under fluoroscopic guidance.   I reamed up to 49.  Hard quality bone was present.  Went to but not through the inner table.  Cuff was then placed with excellent purchase obtained.  One screw was placed as well.  A +4 liner was then placed.  Next, the attention was directed towards  the femur.  Femoral hook was placed.  Foot was externally rotated to about 120 and then adducted and extended.  The conjoined tendon was then released.  Next, the patient's bone quality in the femur was very good.  Proximal reaming with first the box  cutter and then the chili pepper starter reamer and then sequentially up to a size 4 stem was performed.  Correct placement was confirmed in the AP and lateral planes.  Next, the hip was reduced with a +1.5 and +5 mm ball.  Native version was maintained.   The patient had better stability and equal leg lengths with the +5 ball along with better offset.  This was the construct chosen.  Trial components were removed.  The IrriSept was used after the incision as well as after the arthrotomy and at all times  during the case.  True component was tapped into position with same stability parameters maintained.  The patient was stable with external rotation of 60 degrees and  extension to 60 degrees.  Also had good stability with internal rotation and hip  flexion up to 50 degrees.  That was not tested further because of limitations of the Hana bed.  Next, the incision was thoroughly irrigated.  The IrriSept solution placed.  Vancomycin powder placed.  Capsule was then closed using #1 Vicryl suture  followed by interrupted inverted 0 Vicryl suture and #1 Vicryl suture  to close up the tensor fascia lata, followed by 0 Vicryl suture, 2-0 Vicryl suture and 3-0 Monocryl.  Solution of Marcaine, morphine, clonidine injected into the skin for postop pain  relief.  The patient tolerated the procedure well without immediate complications.  Aquacel dressing placed.  Leg lengths were approximately equal at the conclusion of the case.  The patient tolerated the procedure well without immediate complications,  transferred to the recovery room in stable condition.  Luke's assistance was required at all times for opening and closing, positioning, limb, retraction, and tissue management.  His assistance was medical necessity.  HN/NUANCE  D:10/26/2020 T:10/26/2020 JOB:[id number]

## 2020-10-26 NOTE — Evaluation (Signed)
Physical Therapy Evaluation Patient Details Name: Bobby Maxwell. MRN: [id number] DOB: 08-26-1946 Today's Date: 10/26/2020   History of Present Illness  Pt is a 74 y/o male s/p R THA, direct anterior. PMH includes DM, HTN, and bilateral TKA.   Clinical Impression  Pt is s/p surgery above with deficits below. Pt requiring min guard A to stand and ambulate to the bathroom with RW. Pt with increased dizziness so mobility limited. Checked BP after gait and was 87/68 mmHg. After seated rest BP returned to 111/71 mmHg. Anticipate pt will progress well once symptoms improve. Will continue to follow acutely.     Follow Up Recommendations Follow surgeons recommendation for DC plan and follow-up therapies    Equipment Recommendations  3in1 (PT) (only if insurance pays for it per pt)    Recommendations for Other Services       Precautions / Restrictions Precautions Precautions: None Restrictions Weight Bearing Restrictions: Yes RLE Weight Bearing: Weight bearing as tolerated      Mobility  Bed Mobility Overal bed mobility: Needs Assistance Bed Mobility: Supine to Sit     Supine to sit: Min assist     General bed mobility comments: Min A for RLE assist. Increased time required.     Transfers Overall transfer level: Needs assistance Equipment used: Rolling walker (2 wheeled) Transfers: Sit to/from Stand Sit to Stand: Min guard         General transfer comment: Cues for hand placement. Min guard for safety.   Ambulation/Gait Ambulation/Gait assistance: Min guard   Assistive device: Rolling walker (2 wheeled) Gait Pattern/deviations: Step-to pattern;Decreased step length - right;Decreased step length - left;Decreased weight shift to right;Antalgic Gait velocity: Decreased   General Gait Details: Slow, antalgic gait. Cues for sequencing using RW. Pt ambulated to bathroom and back. Pt with dizziness and required seated rest. Checked BP and BP at 87/68. After  prolonged sitting, BP returned to 111/71 mmHg.   Stairs            Wheelchair Mobility    Modified Rankin (Stroke Patients Only)       Balance Overall balance assessment: Needs assistance Sitting-balance support: No upper extremity supported;Feet supported Sitting balance-Leahy Scale: Good     Standing balance support: Bilateral upper extremity supported;During functional activity Standing balance-Leahy Scale: Poor Standing balance comment: Reliant on BUE support                              Pertinent Vitals/Pain Pain Assessment: Faces Faces Pain Scale: Hurts little more Pain Location: R hip  Pain Descriptors / Indicators: Aching;Operative site guarding Pain Intervention(s): Limited activity within patient's tolerance;Monitored during session;Repositioned    Home Living Family/patient expects to be discharged to:: Private residence Living Arrangements: Other relatives (niece) Available Help at Discharge: Family;Available PRN/intermittently Type of Home: House Home Access: Stairs to enter   Entrance Stairs-Number of Steps: 1 Home Layout: One level Home Equipment: Walker - 2 wheels;Cane - single point      Prior Function Level of Independence: Independent with assistive device(s)         Comments: Reports he uses walking stick alot     Hand Dominance        Extremity/Trunk Assessment   Upper Extremity Assessment Upper Extremity Assessment: Overall WFL for tasks assessed    Lower Extremity Assessment Lower Extremity Assessment: RLE deficits/detail RLE Deficits / Details: Deficits consistent with post op pain and weakness.  Cervical / Trunk Assessment Cervical / Trunk Assessment: Normal  Communication   Communication: No difficulties  Cognition Arousal/Alertness: Awake/alert Behavior During Therapy: WFL for tasks assessed/performed Overall Cognitive Status: Within Functional Limits for tasks assessed                                         General Comments      Exercises     Assessment/Plan    PT Assessment Patient needs continued PT services  PT Problem List Decreased strength;Decreased balance;Decreased mobility;Decreased knowledge of use of DME;Pain       PT Treatment Interventions Gait training;DME instruction;Stair training;Functional mobility training;Therapeutic activities;Therapeutic exercise;Balance training;Patient/family education    PT Goals (Current goals can be found in the Care Plan section)  Acute Rehab PT Goals Patient Stated Goal: to go home PT Goal Formulation: With patient Time For Goal Achievement: 11/09/20 Potential to Achieve Goals: Good    Frequency 7X/week   Barriers to discharge        Co-evaluation               AM-PAC PT "6 Clicks" Mobility  Outcome Measure Help needed turning from your back to your side while in a flat bed without using bedrails?: A Little Help needed moving from lying on your back to sitting on the side of a flat bed without using bedrails?: A Little Help needed moving to and from a bed to a chair (including a wheelchair)?: A Little Help needed standing up from a chair using your arms (e.g., wheelchair or bedside chair)?: A Little Help needed to walk in hospital room?: A Little Help needed climbing 3-5 steps with a railing? : A Lot 6 Click Score: 17    End of Session Equipment Utilized During Treatment: Gait belt Activity Tolerance: Treatment limited secondary to medical complications (Comment) (dizziness) Patient left: in chair;with call bell/phone within reach Nurse Communication: Mobility status PT Visit Diagnosis: Unsteadiness on feet (R26.81);Muscle weakness (generalized) (M62.81)    Time: 9390-3009 PT Time Calculation (min) (ACUTE ONLY): 20 min   Charges:   PT Evaluation $PT Eval Low Complexity: 1 Low          Lou Miner, DPT  Acute Rehabilitation Services  Pager: [phone] Office: [phone]   Rudean Hitt 10/26/2020, 3:31 PM

## 2020-10-26 NOTE — Brief Op Note (Signed)
   10/26/2020  10:58 AM  PATIENT:  Bobby Maxwell.  74 y.o. male  PRE-OPERATIVE DIAGNOSIS:  right hip osteoarthritis  POST-OPERATIVE DIAGNOSIS:  right hip osteoarthritis  PROCEDURE:  Procedure(s): RIGHT TOTAL HIP ARTHROPLASTY ANTERIOR APPROACH  SURGEON:  Surgeon(s): Meredith Pel, MD  ASSISTANT: magnant pa  ANESTHESIA:   spinal  EBL: 150 ml    Total I/O In: 1450 [I.V.:1000; IV Piggyback:450] Out: 550 [Urine:400; Blood:150]  BLOOD ADMINISTERED: none  DRAINS: none   LOCAL MEDICATIONS USED:  none  SPECIMEN:  No Specimen  COUNTS:  YES  TOURNIQUET:  * No tourniquets in log *  DICTATION: .Other Dictation: Dictation Number [id number]  PLAN OF CARE: Admit for overnight observation  PATIENT DISPOSITION:  PACU - hemodynamically stable

## 2020-10-26 NOTE — H&P (Signed)
TOTAL HIP ADMISSION H&P  Patient is admitted for right total hip arthroplasty.  Subjective:  Chief Complaint: right hip pain  HPI: Bobby Field., 74 y.o. male, has a history of pain and functional disability in the right hip(s) due to arthritis and patient has failed non-surgical conservative treatments for greater than 12 weeks to include NSAID's and/or analgesics, corticosteriod injections, flexibility and strengthening excercises, use of assistive devices and activity modification.  Onset of symptoms was gradual starting 3 years ago with gradually worsening course since that time.The patient noted no past surgery on the right hip(s).  Patient currently rates pain in the right hip at 8 out of 10 with activity. Patient has night pain, worsening of pain with activity and weight bearing, trendelenberg gait, pain that interfers with activities of daily living, pain with passive range of motion, crepitus and joint swelling. Patient has evidence of subchondral sclerosis and joint space narrowing by imaging studies. This condition presents safety issues increasing the risk of falls. This patient has had A good result with bilateral total knee replacements..  There is no current active infection.  Patient Active Problem List   Diagnosis Date Noted  . Diverticulosis 10/17/2019  . 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 Benign prostatic hyperplasia 10/03/2019  . Hypertension associated with diabetes ([place]) 10/03/2019  . Type 2 diabetes mellitus without complication, without long-term current use of insulin ([place]) 10/03/2019  . Personal history of colonic polyps 10/15/2018  . 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 Pain in right wrist 01/23/2017  . 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 Arthritis   . Diabetes mellitus without complication ([place])   . GERD (gastroesophageal  reflux disease)   . History of kidney stones 2021  . Hypertension     Past Surgical History:  Procedure Laterality Date  . BACK SURGERY  1980's   lower  . COLONOSCOPY N/A 07/09/2014   Procedure: COLONOSCOPY;  Surgeon: Lear Ng, MD;  Location: [facility];  Service: Endoscopy;  Laterality: N/A;  . COLONOSCOPY WITH PROPOFOL N/A 10/15/2018   Procedure: COLONOSCOPY WITH PROPOFOL;  Surgeon: Wilford Corner, MD;  Location: [facility];  Service: Endoscopy;  Laterality: N/A;  . HERNIA REPAIR  40 yrs ago  . HOT HEMOSTASIS N/A 07/09/2014   Procedure: HOT HEMOSTASIS (ARGON PLASMA COAGULATION/BICAP);  Surgeon: Lear Ng, MD;  Location: [facility];  Service: Endoscopy;  Laterality: N/A;  . POLYPECTOMY  10/15/2018   Procedure: POLYPECTOMY;  Surgeon: Wilford Corner, MD;  Location: [facility];  Service: Endoscopy;;  . REPLACEMENT TOTAL KNEE BILATERAL    . TOTAL KNEE ARTHROPLASTY Bilateral     Current Facility-Administered Medications  Medication Dose Route Frequency Provider Last Rate Last Admin  . ceFAZolin (ANCEF) IVPB 2g/100 mL premix  2 g Intravenous On Call to OR Magnant, Gerrianne Scale, PA-C      . lactated ringers infusion   Intravenous Continuous Duane Boston, MD   New Bag at 10/26/20 0700  . povidone-iodine (BETADINE) 7.5 % scrub   Topical Once Magnant, Charles L, PA-C      . povidone-iodine 10 % swab 2 application  2 application Topical Once Magnant, Charles L, PA-C      . povidone-iodine 10 % swab 2 application  2 application Topical Once Magnant, Charles L, PA-C      . tranexamic acid (CYKLOKAPRON) IVPB 1,000 mg  1,000 mg Intravenous To OR Magnant, Juanda Crumble  L, PA-C       Allergies  Allergen Reactions  . Ivp Dye [Iodinated Diagnostic Agents] Anaphylaxis, Hives and Swelling    Social History   Tobacco Use  . Smoking status: Former Research scientist (life sciences)  . Smokeless tobacco: Current User    Types: Snuff  Substance Use Topics  . Alcohol use: Yes    Comment: rare use     Family History  Problem Relation Age of Onset  . Diabetes Mother   . Cancer Brother      Review of Systems  Musculoskeletal: Positive for arthralgias.  All other systems reviewed and are negative.   Objective:  Physical Exam Vitals reviewed.  HENT:     Head: Normocephalic.     Mouth/Throat:     Mouth: Mucous membranes are moist.  Eyes:     Pupils: Pupils are equal, round, and reactive to light.  Cardiovascular:     Rate and Rhythm: Normal rate.     Pulses: Normal pulses.  Pulmonary:     Effort: Pulmonary effort is normal.  Abdominal:     General: Abdomen is flat.  Musculoskeletal:     Cervical back: Normal range of motion.  Skin:    General: Skin is warm.     Capillary Refill: Capillary refill takes less than 2 seconds.  Neurological:     General: No focal deficit present.     Mental Status: He is alert.  Psychiatric:        Mood and Affect: Mood normal.   Examination of the right leg demonstrates well-healed surgical incision from knee replacement.  Leg lengths approximately equal.  Ankle dorsiflexion intact.  Pedal pulses palpable.  Patient has groin pain with internal extra rotation of the leg.  Vital signs in last 24 hours: Temp:  [98 F (36.7 C)] 98 F (36.7 C) (11/09 0625) Pulse Rate:  [58] 58 (11/09 0625) Resp:  [18] 18 (11/09 0625) BP: (117)/(73) 117/73 (11/09 0625) SpO2:  [97 %] 97 % (11/09 0625) Weight:  [91.5 kg] 91.5 kg (11/09 0602)  Labs:   Estimated body mass index is 30.68 kg/m as calculated from the following:   Height as of this encounter: 5\' 8"  (1.727 m).   Weight as of this encounter: 91.5 kg.   Imaging Review Plain radiographs demonstrate moderate degenerative joint disease of the right hip(s). The bone quality appears to be good for age and reported activity level.      Assessment/Plan:  End stage arthritis, right hip(s)  The patient history, physical examination, clinical judgement of the provider and imaging studies are  consistent with end stage degenerative joint disease of the right hip(s) and total hip arthroplasty is deemed medically necessary. The treatment options including medical management, injection therapy, arthroscopy and arthroplasty were discussed at length. The risks and benefits of total hip arthroplasty were presented and reviewed. The risks due to aseptic loosening, infection, stiffness, dislocation/subluxation,  thromboembolic complications and other imponderables were discussed.  The patient acknowledged the explanation, agreed to proceed with the plan and consent was signed. Patient is being admitted for inpatient treatment for surgery, pain control, PT, OT, prophylactic antibiotics, VTE prophylaxis, progressive ambulation and ADL's and discharge planning.The patient is planning to be discharged home with home health services    Patient's anticipated LOS is less than 2 midnights, meeting these requirements: - Younger than 54 - Lives within 1 hour of care - Has a competent adult at home to recover with post-op recover - NO history of  - Chronic  pain requiring opiods  - Diabetes  - Coronary Artery Disease  - Heart failure  - Heart attack  - Stroke  - DVT/VTE  - Cardiac arrhythmia  - Respiratory Failure/COPD  - Renal failure  - Anemia  - Advanced Liver disease

## 2020-10-26 NOTE — Anesthesia Procedure Notes (Signed)
Procedure Name: MAC Date/Time: 10/26/2020 7:35 AM Performed by: Michele Rockers, CRNA Pre-anesthesia Checklist: Patient identified, Emergency Drugs available, Suction available, Timeout performed and Patient being monitored Patient Re-evaluated:Patient Re-evaluated prior to induction Oxygen Delivery Method: Simple face mask

## 2020-10-26 NOTE — Plan of Care (Signed)

## 2020-10-26 NOTE — Care Management Obs Status (Cosign Needed)
[place] NOTIFICATION   Patient Details  Name: Bobby Maxwell. MRN: [id number] Date of Birth: [birth year]   Medicare Observation Status Notification Given:  Yes    Curlene Labrum, RN 10/26/2020, 4:03 PM

## 2020-10-26 NOTE — Transfer of Care (Signed)
Immediate Anesthesia Transfer of Care Note  Patient: Bobby Maxwell.  Procedure(s) Performed: RIGHT TOTAL HIP ARTHROPLASTY ANTERIOR APPROACH (Right Hip)  Patient Location: PACU  Anesthesia Type:Spinal  Level of Consciousness: awake, alert , patient cooperative and responds to stimulation  Airway & Oxygen Therapy: Patient Spontanous Breathing and Patient connected to face mask oxygen  Post-op Assessment: Report given to RN, Post -op Vital signs reviewed and stable and Patient moving all extremities X 4  Post vital signs: Reviewed and stable  Last Vitals:  Vitals Value Taken Time  BP 85/56 10/26/20 1056  Temp    Pulse 49 10/26/20 1056  Resp 26 10/26/20 1056  SpO2 99 % 10/26/20 1056  Vitals shown include unvalidated device data.  Last Pain:  Vitals:   10/26/20 0625  TempSrc: Oral  PainSc:       Patients Stated Pain Goal: 3 (43/88/87 5797)  Complications: No complications documented.

## 2020-10-26 NOTE — Anesthesia Postprocedure Evaluation (Signed)
Anesthesia Post Note  Patient: Bobby Maxwell.  Procedure(s) Performed: RIGHT TOTAL HIP ARTHROPLASTY ANTERIOR APPROACH (Right Hip)     Patient location during evaluation: PACU Anesthesia Type: Spinal Level of consciousness: awake and alert Pain management: pain level controlled Vital Signs Assessment: post-procedure vital signs reviewed and stable Respiratory status: spontaneous breathing and respiratory function stable Cardiovascular status: blood pressure returned to baseline and stable Postop Assessment: spinal receding Anesthetic complications: no   No complications documented.  Last Vitals:  Vitals:   10/26/20 1312 10/26/20 1400  BP: 130/82 117/71  Pulse: (!) 42 (!) 45  Resp: 19 18  Temp:  36.4 C  SpO2: 100% 95%    Last Pain:  Vitals:   10/26/20 1400  TempSrc: Oral  PainSc:                  [place]

## 2020-10-26 NOTE — Anesthesia Procedure Notes (Signed)
Spinal  Patient location during procedure: OR Start time: 10/26/2020 7:34 AM End time: 10/26/2020 7:44 AM Staffing Performed: anesthesiologist  Anesthesiologist: Duane Boston, MD Preanesthetic Checklist Completed: patient identified, IV checked, risks and benefits discussed, surgical consent, monitors and equipment checked, pre-op evaluation and timeout performed Spinal Block Patient position: sitting Prep: DuraPrep Patient monitoring: cardiac monitor, continuous pulse ox and blood pressure Approach: midline Location: L2-3 Injection technique: single-shot Needle Needle type: Pencan  Needle gauge: 24 G Needle length: 9 cm Additional Notes Functioning IV was confirmed and monitors were applied. Sterile prep and drape, including hand hygiene and sterile gloves were used. The patient was positioned and the spine was prepped. The skin was anesthetized with lidocaine.  Free flow of clear CSF was obtained prior to injecting local anesthetic into the CSF.  The spinal needle aspirated freely following injection.  The needle was carefully withdrawn.  The patient tolerated the procedure well.

## 2020-10-26 NOTE — TOC Initial Note (Addendum)
Transition of Care (TOC) - Initial/Assessment Note    Patient Details  Name: Bobby Maxwell. MRN: [id number] Date of Birth: [birth year]  Transition of Care [facility]) CM/SW Contact:    Curlene Labrum, RN Phone Number: 10/26/2020, 4:39 PM  Clinical Narrative:                 Case management met with the patient at the bedside regarding transitions of care to home, S/P right hip arthroplasty by Dr. Marlou Sa.  The patient lives at home alone but plans to have his girlfriend and niece come stay with him at his 1 level townhouse.  The patient currently has a PCP with [facility] in [place].  The patient has a rolling walker at home and will need a 3:1 delivered to the hospital room prior to discharge from the [place] [facility] [place].  Order placed for 3:1 for home.  Patient is agreeable to home health services and was given Medicare choice regarding home health but did not have a preference - called and left a message with Meredeth Ide, [facility] and I'm waiting to hear back.  10/27/2020 1640 - [facility] was unable to accept the patient for admission due to staffing.  Called [facility] and they are accepting the patient for [facility].  I will note this in the discharge instructions.  The nurse will deliver the 3:1 to the room prior to discharge to home.  The patient was given the Medicare Observation notice and understands the document and his observation status.    Will continue to follow the patient for discharge to home.  Expected Discharge Plan: [place] Barriers to Discharge: Continued Medical Work up   Patient Goals and CMS Choice Patient states their goals for this hospitalization and ongoing recovery are:: Plans to discharge home with home health services. CMS Medicare.gov Compare Post Acute Care list provided to:: Patient Choice offered to / list presented to : Patient  Expected Discharge Plan and Services Expected Discharge Plan: [place]   Discharge Planning Services: CM Consult Post Acute Care Choice: Home Health, Durable Medical Equipment Living arrangements for the past 2 months: Single Family Home                 DME Arranged: 3-N-1 DME Agency: [employer] Date DME Agency Contacted: 10/26/20 Time DME Agency Contacted: [phone] Representative spoke with at DME Agency: Vikki Ports, [place] Arranged: PT [place] Agency: [facility] (waiting on return message from Meredeth Ide [facility]) Date [place]: 10/26/20 Time [place]: [phone] Representative spoke with at [place]: waiting on return call from cory  Prior Living Arrangements/Services Living arrangements for the past 2 months: [place] with:: Self (neice and girlfriend are planning on staying with the patient after discharge to home.) Patient language and need for interpreter reviewed:: Yes Do you feel safe going back to the place where you live?: Yes      Need for Family Participation in Patient Care: Yes (Comment) Care giver support system in place?: Yes (comment) Current home services: DME (has a rolling walker at home.) Criminal Activity/Legal Involvement Pertinent to Current Situation/Hospitalization: No - Comment as needed  Activities of Daily Living      Permission Sought/Granted Permission sought to share information with : Case Manager Permission granted to share information with : Yes, Verbal Permission Granted     Permission granted to share info w AGENCY:  Home health agency  Permission granted to share info w Relationship: girlfriend - Horris Latino - [phone]     Emotional Assessment Appearance:: Appears stated age Attitude/Demeanor/Rapport: Gracious Affect (typically observed): Accepting Orientation: : Oriented to Self, Oriented to Place, Oriented to  Time, Oriented to Situation Alcohol / Substance Use: Not Applicable Psych Involvement: No (comment)  Admission diagnosis:  S/P hip replacement,  right [H47.654] Patient Active Problem List   Diagnosis Date Noted  . S/P hip replacement, right 10/26/2020  . Diverticulosis 10/17/2019  . 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 Benign prostatic hyperplasia 10/03/2019  . Hypertension associated with diabetes ([place]) 10/03/2019  . Type 2 diabetes mellitus without complication, without long-term current use of insulin ([place]) 10/03/2019  . Personal history of colonic polyps 10/15/2018  . 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 Pain in right wrist 01/23/2017  . Benign neoplasm of colon 07/09/2014   PCP:  [place]:   [facility] [place], [place] [address] [place] [address] Phone: [phone] Fax: [phone]  [id number] - [place], [address] [place] [address] Phone: [phone] Fax: [place], [place] [address] Phone: [phone] Fax: [phone]     Social Determinants of Health (SDOH) Interventions    Readmission Risk Interventions No flowsheet data found.

## 2020-10-27 DIAGNOSIS — M1611 Unilateral primary osteoarthritis, right hip: Secondary | ICD-10-CM | POA: Diagnosis not present

## 2020-10-27 LAB — GLUCOSE, CAPILLARY
Glucose-Capillary: 92 mg/dL (ref 70–99)
Glucose-Capillary: 222 mg/dL — ABNORMAL HIGH (ref 70–99)
Glucose-Capillary: 121 mg/dL — ABNORMAL HIGH (ref 70–99)
Glucose-Capillary: 193 mg/dL — ABNORMAL HIGH (ref 70–99)

## 2020-10-27 MED ORDER — GABAPENTIN 100 MG PO CAPS
100.0000 mg | ORAL_CAPSULE | Freq: Three times a day (TID) | ORAL | Status: DC
  Administered 2020-10-27 – 2020-10-28 (×4): 100 mg via ORAL
  Filled 2020-10-27 (×4): qty 1

## 2020-10-27 MED ORDER — TRAMADOL HCL 50 MG PO TABS: 50.0000 mg | ORAL_TABLET | Freq: Four times a day (QID) | ORAL | Status: DC | PRN

## 2020-10-27 NOTE — Progress Notes (Signed)
Physical Therapy Treatment Patient Details Name: Bobby Maxwell. MRN: [id number] DOB: Sep 26, 1946 Today's Date: 10/27/2020    History of Present Illness Pt is a 74 y/o male s/p R THA, direct anterior. PMH includes DM, HTN, and bilateral TKA.     PT Comments    Pt supine on arrival, agreeable to therapy session, with good participation and tolerance for mobility. Session focus on progressing safety with bed mobility, transfers and gait progression. HEP handout printed ([email or web address] Access Code: [email or web address]) and pt performed seated RLE therapeutic exercises with good tolerance, but notably weak in R hip flexor and quad strength and needs active assist for seated terminal knee extension and seated hip flexion. Pt maintains RLE in external rotation during gait trial but able to improve with cues, progressed gait distance to ~12ft with RW and Supervision/cues. Verbal/visual review for car transfer safety and handout provided for instruction. Pt continues to benefit from PT services to progress toward functional mobility goals. D/C recs below remain appropriate, pt would benefit from 1-2 more sessions to improve safety with mobility, will continue to follow acutely.  Follow Up Recommendations  Follow surgeon's recommendation for DC plan and follow-up therapies     Equipment Recommendations  3in1 (PT) (only if insurance pays for it per pt)    Recommendations for Other Services       Precautions / Restrictions Precautions Precautions: None Restrictions Weight Bearing Restrictions: Yes RLE Weight Bearing: Weight bearing as tolerated    Mobility  Bed Mobility Overal bed mobility: Needs Assistance Bed Mobility: Supine to Sit;Sit to Supine     Supine to sit: Supervision Sit to supine: Supervision   General bed mobility comments: pt given visual/verbal demo for technique without using rails and HOB flat, able to perform with increased time/effort  Transfers Overall  transfer level: Needs assistance Equipment used: Rolling walker (2 wheeled) Transfers: Sit to/from Stand Sit to Stand: Supervision         General transfer comment: Supervision from bed/chair heights  Ambulation/Gait Ambulation/Gait assistance: Supervision Gait Distance (Feet): 90 Feet Assistive device: Rolling walker (2 wheeled) Gait Pattern/deviations: Step-to pattern;Decreased step length - right;Decreased step length - left;Decreased weight shift to right;Antalgic (hip ER) Gait velocity: Decreased   General Gait Details: fair cadence, step-to gait pattern, good use of RW; pt denies dizziness; pt ER at hip but able to improve to neutral hip position with cues/reminders   Stairs Stairs:  (verbal review for technique to ascend/descend curb step)           Wheelchair Mobility    Modified Rankin (Stroke Patients Only)       Balance Overall balance assessment: Needs assistance Sitting-balance support: No upper extremity supported;Feet supported Sitting balance-Leahy Scale: Good     Standing balance support: Bilateral upper extremity supported;During functional activity Standing balance-Leahy Scale: Poor Standing balance comment: Reliant on BUE support                             Cognition Arousal/Alertness: Awake/alert Behavior During Therapy: Flat affect;WFL for tasks assessed/performed Overall Cognitive Status: Within Functional Limits for tasks assessed                                 General Comments: pt participatory, mildly impulsive      Exercises Total Joint Exercises Hip ABduction/ADduction: AROM;Both;10 reps;Seated (hip aDduction pillow squeezes) Long Arc Quad: Right;Seated;AAROM;10 reps;Limitations [employer]  Quad Limitations: pt needs AA to reach terminal knee ext, pt able to perform ~50% extension prior to needing assist    General Comments        Pertinent Vitals/Pain Pain Assessment: Faces Faces Pain Scale: Hurts  little more Pain Location: R hip with mobility, pt reports minimal pain at rest Pain Descriptors / Indicators: Aching;Operative site guarding;Grimacing;Sore Pain Intervention(s): Monitored during session;Premedicated before session;Repositioned (pt does not want ice)    Home Living                      Prior Function            PT Goals (current goals can now be found in the care plan section) Acute Rehab PT Goals Patient Stated Goal: to go home PT Goal Formulation: With patient Time For Goal Achievement: 11/09/20 Potential to Achieve Goals: Good Progress towards PT goals: Progressing toward goals    Frequency    7X/week      PT Plan Current plan remains appropriate    Co-evaluation              AM-PAC PT "6 Clicks" Mobility   Outcome Measure  Help needed turning from your back to your side while in a flat bed without using bedrails?: None Help needed moving from lying on your back to sitting on the side of a flat bed without using bedrails?: A Little Help needed moving to and from a bed to a chair (including a wheelchair)?: None Help needed standing up from a chair using your arms (e.g., wheelchair or bedside chair)?: A Little Help needed to walk in hospital room?: None Help needed climbing 3-5 steps with a railing? : A Little 6 Click Score: 21    End of Session Equipment Utilized During Treatment: Gait belt Activity Tolerance: Patient tolerated treatment well Patient left: in bed;with call bell/phone within reach;with bed alarm set (pt refusing ice or SCDs) Nurse Communication: Mobility status;Patient requests pain meds PT Visit Diagnosis: Unsteadiness on feet (R26.81);Muscle weakness (generalized) (M62.81)     Time: 1191-4782 PT Time Calculation (min) (ACUTE ONLY): 28 min  Charges:  $Gait Training: 8-22 mins $Therapeutic Exercise: 8-22 mins                     Aavya Shafer P., PTA Acute Rehabilitation Services Pager: [phone] Office:  [phone]   Angus Palms 10/27/2020, 5:12 PM

## 2020-10-27 NOTE — Progress Notes (Signed)
  Subjective: Bobby Maxwell. is a 74 y.o. male s/p right THA.  They are POD 1.  Pt's pain is controlled.  Pt has ambulated with some difficulty.  He states that he does not want to take oxycodone or the tramadol that he takes at home.  He wishes to just take gabapentin for pain control.   Objective: Vital signs in last 24 hours: Temp:  [97.6 F (36.4 C)-99.2 F (37.3 C)] 98.8 F (37.1 C) (11/10 0400) Pulse Rate:  [38-65] 62 (11/10 0400) Resp:  [14-20] 18 (11/10 0400) BP: (85-130)/(56-92) 114/65 (11/10 0400) SpO2:  [94 %-100 %] 95 % (11/10 0400)  Intake/Output from previous day: 11/09 0701 - 11/10 0700 In: 2544.3 [P.O.:740; I.V.:1254.3; IV Piggyback:550] Out: 1330 [Urine:1180; Blood:150] Intake/Output this shift: No intake/output data recorded.  Exam:  No gross blood or drainage overlying the dressing 2+ DP pulse Sensation intact distally in the right foot Able to dorsiflex and plantarflex the right foot   Labs: No results for input(s): HGB in the last 72 hours. No results for input(s): WBC, RBC, HCT, PLT in the last 72 hours. No results for input(s): NA, K, CL, CO2, BUN, CREATININE, GLUCOSE, CALCIUM in the last 72 hours. No results for input(s): LABPT, INR in the last 72 hours.  Assessment/Plan: Pt is POD 1 s/p right THA.    -Plan to discharge to home today or tomorrow pending patient's pain and PT eval  -WBAT with a walker  -Plan to discontinue oxycodone per patient's request.  He will just take gabapentin for pain control.      [name] L [name] 10/27/2020, 8:33 AM

## 2020-10-27 NOTE — Progress Notes (Signed)
Bobby Maxwell, [place] contacted to report patient report of pain and hesitancy to take ordered medications.  Order received for Tramadol.

## 2020-10-27 NOTE — Progress Notes (Signed)
Physical Therapy Treatment Patient Details Name: Bobby Maxwell. MRN: [id number] DOB: 24-Apr-1946 Today's Date: 10/27/2020    History of Present Illness Pt is a 74 y/o male s/p R THA, direct anterior. PMH includes DM, HTN, and bilateral TKA.     PT Comments    Pt supine on arrival, A&O and agreeable to therapy session with encouragement and with good participation and improved tolerance for mobility compared with previous session. Pt able to progress gait distance and standing without sxs and VSS (see orthostatic vitals taken below). Pt performed bed mobility with heavy reliance on bed features and up to min guard assist and transfers with Supervision to min guard. Pt progressed gait distance to 182ft with Supervision, with good use of RW and no dizziness. Pt deferred to sit up in chair at end of session, wanting to take a nap and had sat up in chair earlier. Pt performed supine/seated A/AAROM therapeutic exercises as detailed below with good tolerance, with good R quad contraction but noted R hip flexion weakness and unable to perform R SLR without assist. Pt continues to benefit from PT services to progress toward functional mobility goals. D/C recs below remain appropriate, pending progress. Anticipate pt to clear in 1-2 more sessions, of note pt is requesting to stay overnight for more therapies tomorrow AM, MD notified.  Follow Up Recommendations  Follow surgeon's recommendation for DC plan and follow-up therapies     Equipment Recommendations  3in1 (PT) (only if insurance pays for it per pt)    Recommendations for Other Services       Precautions / Restrictions Precautions Precautions: None Restrictions Weight Bearing Restrictions: Yes RLE Weight Bearing: Weight bearing as tolerated    Mobility  Bed Mobility Overal bed mobility: Needs Assistance Bed Mobility: Supine to Sit;Sit to Supine     Supine to sit: Min guard;HOB elevated (heavy use of bed  features/rails) Sit to supine: Supervision;HOB elevated   General bed mobility comments: pt slightly impulsive with technique and heavy use of rails, pt not wanting physical assist even if it would mean less pain with transition  Transfers Overall transfer level: Needs assistance Equipment used: Rolling walker (2 wheeled) Transfers: Sit to/from Stand Sit to Stand: Supervision;Min guard         General transfer comment: MGA initially from bed height, progressing to Supervision from chair height; good recall of sequencing  Ambulation/Gait Ambulation/Gait assistance: Supervision Gait Distance (Feet): 100 Feet (62ft to chair, seated break, then 167ft) Assistive device: Rolling walker (2 wheeled) Gait Pattern/deviations: Step-to pattern;Decreased step length - right;Decreased step length - left;Decreased weight shift to right;Antalgic Gait velocity: Decreased   General Gait Details: fair cadence, step-to gait pattern, good use of RW; pt denies dizziness and orthostatics stable prior to gait trial   Stairs             Wheelchair Mobility    Modified Rankin (Stroke Patients Only)       Balance Overall balance assessment: Needs assistance Sitting-balance support: No upper extremity supported;Feet supported Sitting balance-Leahy Scale: Good     Standing balance support: Bilateral upper extremity supported;During functional activity Standing balance-Leahy Scale: Poor Standing balance comment: Reliant on BUE support                             Cognition Arousal/Alertness: Awake/alert Behavior During Therapy: Flat affect;WFL for tasks assessed/performed Overall Cognitive Status: Within Functional Limits for tasks assessed  General Comments: pt with some hesitancy to mobilize but after education on benefits/purpose of more frequently mobilizing and role of PT, pt agreeable; fair safety awareness, needs min cues for  problem solving      Exercises Total Joint Exercises Ankle Circles/Pumps: AROM;Strengthening;Both;10 reps;Supine Quad Sets: AROM;Strengthening;Right;5 reps;Supine Heel Slides: AROM;Right;5 reps;Supine Hip ABduction/ADduction: AAROM;Right;5 reps;Supine Straight Leg Raises: AAROM;Right;5 reps;Supine;Limitations Straight Leg Raises Limitations: pt nearly PROM, needs heavy AA to even raise 6", limited 2/2 weakness Long Arc Quad: AROM;Right;5 reps;Seated    General Comments General comments (skin integrity, edema, etc.): pt refusing ice pack, reporting he finds it too uncomfortable; reviewed positioning and keeping knee pointed up rather than externally rotated to side, will need reinforcement      Pertinent Vitals/Pain Pain Assessment: 0-10 Pain Score: 7  Pain Location: R hip with mobility, pt reports minimal pain at rest Pain Descriptors / Indicators: Aching;Operative site guarding;Grimacing;Sore Pain Intervention(s): Monitored during session;Repositioned;Patient requesting pain meds-RN notified (pt refusing ice when offered)  Orthostatic BPs  Supine 118/74 (87) HR 60's bpm  Sitting 121/71 (85) HR 73 bpm  Standing 107/62 (77) HR 82 bpm  Pt denies dizziness or other sxs and not diaphoretic  Home Living                      Prior Function            PT Goals (current goals can now be found in the care plan section) Acute Rehab PT Goals Patient Stated Goal: to go home PT Goal Formulation: With patient Time For Goal Achievement: 11/09/20 Potential to Achieve Goals: Good Progress towards PT goals: Progressing toward goals    Frequency    7X/week      PT Plan Current plan remains appropriate    Co-evaluation              AM-PAC PT "6 Clicks" Mobility   Outcome Measure  Help needed turning from your back to your side while in a flat bed without using bedrails?: None Help needed moving from lying on your back to sitting on the side of a flat bed without  using bedrails?: A Little Help needed moving to and from a bed to a chair (including a wheelchair)?: None Help needed standing up from a chair using your arms (e.g., wheelchair or bedside chair)?: A Little Help needed to walk in hospital room?: None Help needed climbing 3-5 steps with a railing? : A Little 6 Click Score: 21    End of Session Equipment Utilized During Treatment: Gait belt Activity Tolerance: Patient tolerated treatment well Patient left: in bed;with call bell/phone within reach;with bed alarm set (pt refusing ice or SCDs) Nurse Communication: Mobility status;Patient requests pain meds PT Visit Diagnosis: Unsteadiness on feet (R26.81);Muscle weakness (generalized) (M62.81)     Time: 6644-0347 PT Time Calculation (min) (ACUTE ONLY): 30 min  Charges:  $Gait Training: 8-22 mins $Therapeutic Exercise: 8-22 mins                     Grantley Savage P., PTA Acute Rehabilitation Services Pager: [phone] Office: [phone]   Dorathy Kinsman Aaliah Jorgenson 10/27/2020, 12:03 PM

## 2020-10-27 NOTE — Plan of Care (Signed)
  Problem: Education: Goal: Knowledge of General Education information will improve Description Including pain rating scale, medication(s)/side effects and non-pharmacologic comfort measures Outcome: Progressing   Problem: Health Behavior/Discharge Planning: Goal: Ability to manage health-related needs will improve Outcome: Progressing   

## 2020-10-28 ENCOUNTER — Telehealth: Payer: Self-pay | Admitting: Orthopedic Surgery

## 2020-10-28 ENCOUNTER — Other Ambulatory Visit: Payer: Self-pay | Admitting: Orthopedic Surgery

## 2020-10-28 ENCOUNTER — Encounter (HOSPITAL_COMMUNITY): Payer: Self-pay | Admitting: Orthopedic Surgery

## 2020-10-28 DIAGNOSIS — M1611 Unilateral primary osteoarthritis, right hip: Secondary | ICD-10-CM | POA: Diagnosis not present

## 2020-10-28 LAB — GLUCOSE, CAPILLARY: Glucose-Capillary: 112 mg/dL — ABNORMAL HIGH (ref 70–99)

## 2020-10-28 MED ORDER — GABAPENTIN 100 MG PO CAPS: 100.0000 mg | ORAL_CAPSULE | Freq: Three times a day (TID) | ORAL | 0 refills | Status: AC | PRN

## 2020-10-28 MED ORDER — METHOCARBAMOL 500 MG PO TABS: 500.0000 mg | ORAL_TABLET | Freq: Three times a day (TID) | ORAL | 0 refills | Status: DC | PRN

## 2020-10-28 MED ORDER — OXYCODONE HCL 5 MG PO CAPS
5.0000 mg | ORAL_CAPSULE | ORAL | 0 refills | Status: DC | PRN
Start: 2020-10-28 — End: 2020-10-29

## 2020-10-28 NOTE — Telephone Encounter (Signed)
See below

## 2020-10-28 NOTE — Progress Notes (Addendum)
Physical Therapy Treatment Patient Details Name: Bobby Maxwell.  MRN: [id number] DOB: 01/13/1946 Today's Date: 10/28/2020    History of Present Illness Pt is a 74 y/o male s/p R THA, direct anterior. PMH includes DM, HTN, and bilateral TKA.     PT Comments    Pt seated in chair on arrival, agreeable and participatory, with good tolerance for session. Session focus on progressing RLE strength through performance of RLE therapeutic exercises, pt still needing AAROM for a majority of supine/seated exercises and with significant hip flexor and hip abduction weakness, pt with good participation t/o and encouraged pt to perform per HEP handout TID. Reviewed safety with AD use and strategies to normalize gait deviations, although pt defers AM gait trial as he wants to prepare for D/C. Pt pulling 2,000 on IS. Pt continues to benefit from PT services to progress toward functional mobility goals. D/C recs below remain appropriate, anticipate pt safe to D/C home with increased PRN S/A from girlfriend once medically cleared.  Follow Up Recommendations  Follow surgeon's recommendation for DC plan and follow-up therapies     Equipment Recommendations  3in1 (PT) (only if insurance pays for it per pt)    Recommendations for Other Services       Precautions / Restrictions Restrictions Weight Bearing Restrictions: Yes RLE Weight Bearing: Weight bearing as tolerated    Cognition Arousal/Alertness: Awake/alert Behavior During Therapy: Flat affect;WFL for tasks assessed/performed Overall Cognitive Status: Within Functional Limits for tasks assessed            General Comments: participatory      Exercises Total Joint Exercises Ankle Circles/Pumps: AROM;Strengthening;Both;10 reps;Supine Quad Sets: AROM;Right;5 reps;Supine Towel Squeeze: AROM;Strengthening;Both;10 reps;Seated Short Arc Quad: AROM;Right;10 reps;Supine Heel Slides: AAROM;Right;10 reps;Supine;Limitations Heel Slides  Limitations: needs assist to maintain knee upright/hip in neutral position as pt tends to ER at hip when unassisted Hip ABduction/ADduction: AAROM;Right;10 reps;Supine (also hip ADduction pillow squeeze bwn feet) Long Arc Quad: Right;Seated;AAROM;10 reps;Limitations Long Arc Quad Limitations: pt needs AA to reach terminal knee ext, pt able to perform ~50% extension prior to needing assist    General Comments General comments (skin integrity, edema, etc.): reviewed importance of HEP performance and supervision for all mobility due to increased fall risk      Pertinent Vitals/Pain Pain Assessment: Faces Faces Pain Scale: Hurts little more Pain Location: R groin pain with exercises Pain Descriptors / Indicators: Aching;Operative site guarding;Grimacing;Sore Pain Intervention(s): Monitored during session;Repositioned (pt refused ice)   Vitals:   10/28/20 0840  BP: 123/69  Pulse: 68  Resp: 20  Temp: 98.7 F (37.1 C)  SpO2: 95%    Home Living   Prior Function    PT Goals (current goals can now be found in the care plan section) Acute Rehab PT Goals Patient Stated Goal: to go home PT Goal Formulation: With patient Time For Goal Achievement: 11/09/20 Potential to Achieve Goals: Good Progress towards PT goals: Progressing toward goals    Frequency    7X/week      PT Plan Current plan remains appropriate    Co-evaluation              AM-PAC PT "6 Clicks" Mobility   Outcome Measure  Help needed turning from your back to your side while in a flat bed without using bedrails?: None Help needed moving from lying on your back to sitting on the side of a flat bed without using bedrails?: None Help needed moving to and from a bed to a  chair (including a wheelchair)?: None Help needed standing up from a chair using your arms (e.g., wheelchair or bedside chair)?: A Little Help needed to walk in hospital room?: None Help needed climbing 3-5 steps with a railing? : A Little 6  Click Score: 22    End of Session Equipment Utilized During Treatment: Gait belt Activity Tolerance: Patient tolerated treatment well Patient left: in chair;with call bell/phone within reach   PT Visit Diagnosis: Unsteadiness on feet (R26.81);Muscle weakness (generalized) (M62.81)     Time: 1610-9604 PT Time Calculation (min) (ACUTE ONLY): 16 min  Charges:  $Therapeutic Exercise: 8-22 mins                     Alvin Rubano P., PTA Acute Rehabilitation Services Pager: [phone] Office: [phone]   Angus Palms 10/28/2020, 9:32 AM

## 2020-10-28 NOTE — Progress Notes (Signed)
Discharge instructions addressed; Pt in stable condition; BSC 3in1 in room; Pt.'s girlfriend in room and will be pt.'s ride home.

## 2020-10-28 NOTE — Telephone Encounter (Signed)
The pharmacy called asking if we can re send for tablets for the oxycodone.   Due to the fact that they are out pills and wont receive anymore until Monday.

## 2020-10-28 NOTE — Progress Notes (Signed)
  Subjective: Bobby Maxwell. is a 74 y.o. male s/p right THA.  They are POD2.  Pt's pain is controlled.  Pt has ambulated with some difficulty.  States he is ready for discharge home  Objective: Vital signs in last 24 hours: Temp:  [98.3 F (36.8 C)-99.1 F (37.3 C)] 98.7 F (37.1 C) (11/11 0353) Pulse Rate:  [62-69] 62 (11/11 0353) Resp:  [15-18] 17 (11/11 0353) BP: (113-126)/(69-76) 119/76 (11/11 0353) SpO2:  [91 %-95 %] 94 % (11/11 0353)  Intake/Output from previous day: 11/10 0701 - 11/11 0700 In: 680 [P.O.:480; IV Piggyback:200] Out: 1400 [Urine:1400] Intake/Output this shift: No intake/output data recorded.  Exam:  No gross blood or drainage overlying the dressing 2+ DP pulse Sensation intact distally in the right foot Maxwell to dorsiflex and plantarflex the right foot   Labs: No results for input(s): HGB in the last 72 hours. No results for input(s): WBC, RBC, HCT, PLT in the last 72 hours. No results for input(s): NA, K, CL, CO2, BUN, CREATININE, GLUCOSE, CALCIUM in the last 72 hours. No results for input(s): LABPT, INR in the last 72 hours.  Assessment/Plan: Pt is POD2 s/p right THA.    -Plan to discharge to home today  -WBAT with a walker  -Okay to shower, dressing is waterproof.  Cautioned patient against soaking dressing in bath/pool/body of water     Gerrianne Scale Kwynn Schlotter 10/28/2020, 8:20 AM

## 2020-10-29 ENCOUNTER — Other Ambulatory Visit: Payer: Self-pay | Admitting: Surgical

## 2020-10-29 MED ORDER — OXYCODONE HCL 5 MG PO CAPS: 5.0000 mg | ORAL_CAPSULE | ORAL | 0 refills | Status: DC | PRN

## 2020-10-29 NOTE — Telephone Encounter (Signed)
[facility] called who is taking care of [facility] called asking if we can send his oxy to [facility] on [address] due to them not having it until Monday.

## 2020-10-29 NOTE — Telephone Encounter (Signed)
Sent in RX

## 2020-11-10 ENCOUNTER — Ambulatory Visit (INDEPENDENT_AMBULATORY_CARE_PROVIDER_SITE_OTHER): Payer: Medicare HMO | Admitting: Orthopedic Surgery

## 2020-11-10 ENCOUNTER — Ambulatory Visit (INDEPENDENT_AMBULATORY_CARE_PROVIDER_SITE_OTHER): Payer: Medicare HMO

## 2020-11-10 DIAGNOSIS — Z96641 Presence of right artificial hip joint: Secondary | ICD-10-CM

## 2020-11-11 NOTE — Discharge Summary (Signed)
Physician Discharge Summary      Patient ID: Bobby Maxwell. MRN: [id number] DOB/AGE: [birth year] 74 y.o.  Admit date: 10/26/2020 Discharge date: 10/28/2020  Admission Diagnoses:  Active Problems:   S/P hip replacement, right   Discharge Diagnoses:  Same  Surgeries: Procedure(s): RIGHT TOTAL HIP ARTHROPLASTY ANTERIOR APPROACH on 10/26/2020   Consultants:   Discharged Condition: Stable  Hospital Course: Bobby Maxwell. is an 74 y.o. 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  They were brought to the operating room on 10/26/2020 and underwent the above named procedures.  Pt awoke from anesthesia without complication and was transferred to the floor. On POD1, patient was able to mobilize well but his pain was uncontrolled after he requested solely gabapentin for pain control.  His pain was better controlled on POD2 with Tramadol and he was discharged home.  Pt will f/u with Dr. Marlou Sa in clinic in ~2 weeks.   Antibiotics given:  Anti-infectives (From admission, onward)   Start     Dose/Rate Route Frequency Ordered Stop   10/26/20 1430  ceFAZolin (ANCEF) IVPB 2g/100 mL premix        2 g 200 mL/hr over 30 Minutes Intravenous Every 8 hours 10/26/20 1342 10/27/20 0735   10/26/20 0947  vancomycin (VANCOCIN) powder  Status:  Discontinued          As needed 10/26/20 0947 10/26/20 1049   10/26/20 0600  ceFAZolin (ANCEF) IVPB 2g/100 mL premix        2 g 200 mL/hr over 30 Minutes Intravenous On call to O.R. 10/26/20 6010 10/26/20 0735    .  Recent vital signs:  Vitals:   10/28/20 0353 10/28/20 0840  BP: 119/76 123/69  Pulse: 62 68  Resp: 17 20  Temp: 98.7 F (37.1 C) 98.7 F (37.1 C)  SpO2: 94% 95%    Recent laboratory studies:  Results for orders placed or performed during the hospital encounter of 10/26/20  Glucose, capillary  Result Value Ref Range   Glucose-Capillary 182  (H) 70 - 99 mg/dL  Glucose, capillary  Result Value Ref Range   Glucose-Capillary 121 (H) 70 - 99 mg/dL  Hemoglobin A1c  Result Value Ref Range   Hgb A1c MFr Bld 7.3 (H) 4.8 - 5.6 %   Mean Plasma Glucose 162.81 mg/dL  Glucose, capillary  Result Value Ref Range   Glucose-Capillary 100 (H) 70 - 99 mg/dL  Glucose, capillary  Result Value Ref Range   Glucose-Capillary 190 (H) 70 - 99 mg/dL  Glucose, capillary  Result Value Ref Range   Glucose-Capillary 194 (H) 70 - 99 mg/dL  Glucose, capillary  Result Value Ref Range   Glucose-Capillary 92 70 - 99 mg/dL  Glucose, capillary  Result Value Ref Range   Glucose-Capillary 222 (H) 70 - 99 mg/dL  Glucose, capillary  Result Value Ref Range   Glucose-Capillary 121 (H) 70 - 99 mg/dL  Glucose, capillary  Result Value Ref Range   Glucose-Capillary 193 (H) 70 - 99 mg/dL  Glucose, capillary  Result Value Ref Range   Glucose-Capillary 112 (H) 70 - 99 mg/dL    Discharge Medications:   Allergies as of 10/28/2020      Reactions   Ivp Dye [iodinated Diagnostic Agents] Anaphylaxis, Hives, Swelling      Medication List    TAKE these medications   aspirin EC 81 MG tablet Take 81 mg by mouth  daily.   CINNAMON PO Take 1 capsule by mouth daily.   gabapentin 100 MG capsule Commonly known as: NEURONTIN Take 1 capsule (100 mg total) by mouth 3 (three) times daily as needed. What changed: when to take this   glipiZIDE 10 MG 24 hr tablet Commonly known as: GLUCOTROL XL Take 10 mg by mouth daily with breakfast.   lisinopril-hydrochlorothiazide 20-12.5 MG tablet Commonly known as: ZESTORETIC Take 1 tablet by mouth every morning.   loratadine 10 MG tablet Commonly known as: CLARITIN Take 10 mg by mouth daily as needed for allergies.   metFORMIN 500 MG tablet Commonly known as: GLUCOPHAGE Take 1,000 mg by mouth 2 (two) times daily with a meal.   methocarbamol 500 MG tablet Commonly known as: ROBAXIN Take 1 tablet (500 mg total) by  mouth every 8 (eight) hours as needed for muscle spasms.   multivitamin with minerals Tabs tablet Take 1 tablet by mouth daily.   tamsulosin 0.4 MG Caps capsule Commonly known as: FLOMAX Take 0.4 mg by mouth at bedtime.   traMADol 50 MG tablet Commonly known as: ULTRAM Take 1 tablet (50 mg total) by mouth daily as needed.   TURMERIC PO Take 1 capsule by mouth daily.   Vitamin D3 50 MCG (2000 UT) Tabs Take 2,000 Units by mouth daily.       Diagnostic Studies: DG Pelvis Portable  Result Date: 10/26/2020 CLINICAL DATA:  Postop right hip replacement EXAM: PORTABLE PELVIS 1-2 VIEWS COMPARISON:  None. FINDINGS: Total hip replacement on the right in satisfactory position and alignment. No fracture or complication. IMPRESSION: Satisfactory right hip replacement. Electronically Signed   By: Franchot Gallo M.D.   On: 10/26/2020 11:30   DG C-Arm 1-60 Min  Result Date: 10/26/2020 CLINICAL DATA:  Right hip replacement. EXAM: DG C-ARM 1-60 MIN; OPERATIVE RIGHT HIP WITH PELVIS FLUOROSCOPY TIME:  Fluoroscopy Time:  39 seconds COMPARISON:  Hip radiographs 07/26/2020. FINDINGS: Two C-arm fluoroscopic images were obtained intraoperatively and submitted for post operative interpretation. These images demonstrate right total hip arthroplasty without unexpected findings. Please see the performing provider's procedural report for further detail. IMPRESSION: Intraoperative fluoroscopic images, as detailed above. Electronically Signed   By: Margaretha Sheffield MD   On: 10/26/2020 10:30   DG HIP OPERATIVE UNILAT W OR W/O PELVIS RIGHT  Result Date: 10/26/2020 CLINICAL DATA:  Right hip replacement. EXAM: DG C-ARM 1-60 MIN; OPERATIVE RIGHT HIP WITH PELVIS FLUOROSCOPY TIME:  Fluoroscopy Time:  39 seconds COMPARISON:  Hip radiographs 07/26/2020. FINDINGS: Two C-arm fluoroscopic images were obtained intraoperatively and submitted for post operative interpretation. These images demonstrate right total hip arthroplasty  without unexpected findings. Please see the performing provider's procedural report for further detail. IMPRESSION: Intraoperative fluoroscopic images, as detailed above. Electronically Signed   By: Margaretha Sheffield MD   On: 10/26/2020 10:30    Disposition: Discharge disposition: 01-Home or Self Care       Discharge Instructions    Call MD / Call 911   Complete by: As directed    If you experience chest pain or shortness of breath, CALL 911 and be transported to the hospital emergency room.  If you develope a fever above 101 F, pus (white drainage) or increased drainage or redness at the wound, or calf pain, call your surgeon's office.   Constipation Prevention   Complete by: As directed    Drink plenty of fluids.  Prune juice may be helpful.  You may use a stool softener, such as Colace (over  the counter) 100 mg twice a day.  Use MiraLax (over the counter) for constipation as needed.   Diet - low sodium heart healthy   Complete by: As directed    Discharge instructions   Complete by: As directed    You may shower, dressing is waterproof.  Do not remove the dressing, we will remove it at your first post-op appointment.  Do not take a bath or soak the knee in a tub or pool.  You may weightbear as you can tolerate on the operative leg with a walker. You will follow-up with Dr. Marlou Sa in the clinic in 2 weeks at your given appointment date.  Call the office with any questions or concerns  Dental Antibiotics:  In most cases prophylactic antibiotics for Dental procdeures after total joint surgery are not necessary.  Exceptions are as follows:  1. History of prior total joint infection  2. Severely immunocompromised (Organ Transplant, cancer chemotherapy, Rheumatoid biologic meds such as [place])  3. Poorly controlled diabetes (A1C &gt; 8.0, blood glucose over 200)  If you have one of these conditions, contact your surgeon for an antibiotic prescription, prior to your dental procedure.     Increase activity slowly as tolerated   Complete by: As directed        Follow-up Information    [facility] Follow up.   Specialty: [place] Why: [facility] will be providing you with home health PT services.  They will call you in the next 24-48 hours after your discharge home to set up your therapy times. Contact information: [address] [place] [address] [phone]        [facility]. Schedule an appointment as soon as possible for a visit.   Why: Please follow up with your primary care physician in the next 2 weeks for a hospital follow up. Contact information: [place] [address] [phone]        [facility] Follow up.   Why: [facility] will be providing you with a 3:1 to take home upon your discharge to home. Contact information: [address] [place] [address] [phone]                Signed: Donella Stade 11/11/2020, 11:52 AM

## 2020-11-13 ENCOUNTER — Encounter: Payer: Self-pay | Admitting: Orthopedic Surgery

## 2020-11-13 NOTE — Progress Notes (Signed)
Post-Op Visit Note   Patient: Bobby Maxwell.           Date of Birth: 1946/07/04           MRN: [id number] Visit Date: 11/10/2020 PCP: [place]:  Chief Complaint:  Chief Complaint  Patient presents with  . Right Hip - Routine Post Op   Visit Diagnoses:  1. S/P total right hip arthroplasty     Plan: Patient is a 74 year old male presents s/p right total hip arthroplasty on 10/26/2020.  Patient states that he is doing well overall.  He is not ambulating with any cane or walker.  Denies any fevers, chills, night sweats, drainage.  He is not taking any oxycodone or tramadol to control his pain, instead controlling his pain with gabapentin and over-the-counter medications.  Aquacel dressing removed today and incision is healing well with no concrete evidence of infection or dehiscence.  There are 2 areas on the incision that have small areas of scabbing and erythema but no frank drainage from the incision or significant dehiscence.  He is walking well without pain.  He is sleeping well without pain.  No significant pain with hip range of motion on exam today.  No calf tenderness.  Negative Homans' sign.  Radiographs of the right hip show right hip prosthesis in good position and alignment without any complicating features.  Plan for patient to follow-up in about 2 weeks for clinical recheck and incision recheck.  Patient agreed with plan.  Follow-Up Instructions: No follow-ups on file.   Orders:  Orders Placed This Encounter  Procedures  . XR HIP UNILAT W OR W/O PELVIS 2-3 VIEWS RIGHT   No orders of the defined types were placed in this encounter.   Imaging: No results found.  PMFS History: Patient Active Problem List   Diagnosis Date Noted  . S/P hip replacement, right 10/26/2020  . Diverticulosis 10/17/2019  . 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 Benign prostatic hyperplasia 10/03/2019  . Hypertension associated with diabetes ([place]) 10/03/2019  . Type 2 diabetes mellitus without complication, without long-term current use of insulin ([place]) 10/03/2019  . Personal history of colonic polyps 10/15/2018  . 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 Pain in right wrist 01/23/2017  . 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 Arthritis   . Diabetes mellitus without complication ([place])   . GERD (gastroesophageal reflux disease)   . History of kidney stones 2021  . Hypertension     Family History  Problem Relation Age of Onset  . Diabetes Mother   . Cancer Brother     Past Surgical History:  Procedure Laterality Date  . BACK SURGERY  1980's   lower  . COLONOSCOPY N/A 07/09/2014   Procedure: COLONOSCOPY;  Surgeon: Lear Ng, MD;  Location: [facility];  Service: Endoscopy;  Laterality: N/A;  . COLONOSCOPY WITH PROPOFOL N/A 10/15/2018   Procedure: COLONOSCOPY WITH PROPOFOL;  Surgeon: Wilford Corner, MD;  Location: [facility];  Service: Endoscopy;  Laterality: N/A;  . HERNIA REPAIR  40 yrs ago  . HOT HEMOSTASIS N/A 07/09/2014   Procedure: HOT HEMOSTASIS (ARGON PLASMA COAGULATION/BICAP);  Surgeon: Lear Ng, MD;  Location: [facility];  Service: Endoscopy;  Laterality: N/A;  . POLYPECTOMY  10/15/2018   Procedure: POLYPECTOMY;  Surgeon: Wilford Corner, MD;  [facility];  Service: Endoscopy;;  .  REPLACEMENT TOTAL KNEE BILATERAL    . TOTAL HIP ARTHROPLASTY Right 10/26/2020   Procedure: RIGHT TOTAL HIP ARTHROPLASTY ANTERIOR APPROACH;  Surgeon: Meredith Pel, MD;  Location: [facility];  Service: Orthopedics;  Laterality: Right;  . TOTAL KNEE ARTHROPLASTY Bilateral    Social History   Occupational History  . Not on file  Tobacco Use  . Smoking status: Former Research scientist (life sciences)  . Smokeless tobacco: Current User    Types: Snuff  Vaping Use  . Vaping Use: Never used    Substance and Sexual Activity  . Alcohol use: Yes    Comment: rare use  . Drug use: No  . Sexual activity: Yes

## 2020-11-14 ENCOUNTER — Encounter: Payer: Self-pay | Admitting: Orthopedic Surgery

## 2020-11-24 ENCOUNTER — Ambulatory Visit (INDEPENDENT_AMBULATORY_CARE_PROVIDER_SITE_OTHER): Payer: Medicare HMO | Admitting: Orthopedic Surgery

## 2020-11-24 DIAGNOSIS — Z96641 Presence of right artificial hip joint: Secondary | ICD-10-CM

## 2020-11-27 ENCOUNTER — Encounter: Payer: Self-pay | Admitting: Orthopedic Surgery

## 2020-11-27 NOTE — Progress Notes (Signed)
Post-Op Visit Note   Patient: Bobby Maxwell.           Date of Birth: 29-Aug-1946           MRN: [id number] Visit Date: 11/24/2020 PCP: Bobby Maxwell:  Chief Complaint:  Chief Complaint  Patient presents with   Routine Post Op   Visit Diagnoses:  1. S/P total right hip arthroplasty     Plan: Bobby Maxwell is a 74 year old patient underwent right total hip replacement 10/26/2020.  He is doing well.  He is doing his own therapy.  He is able to sleep on the right-hand side.  Using no assistive devices.  On exam he has pretty normal gait and good hip flexion strength.  I do not think he has enough strength yet to get up into the cab repeatedly.  I think it would be okay for him to return to work in 4 weeks.  I will see him back as needed.  Incision intact.  Follow-Up Instructions: Return if symptoms worsen or fail to improve.   Orders:  No orders of the defined types were placed in this encounter.  No orders of the defined types were placed in this encounter.   Imaging: No results found.  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 (Bobby Maxwell) 10/09/2019   Benign prostatic hyperplasia 10/03/2019   Hypertension associated with diabetes (Bobby Maxwell) 10/03/2019   Type 2 diabetes mellitus without complication, without long-term current use of insulin (Bobby Maxwell) 10/03/2019   Personal history of colonic polyps 10/15/2018   Coccyodynia 01/23/2017   Pain in right wrist 01/23/2017   Benign neoplasm of colon 07/09/2014   Past Medical History:  Diagnosis Date   Arthritis    Diabetes mellitus without complication (HCC)    GERD (gastroesophageal reflux disease)    History of kidney stones 2021   Hypertension     Family History  Problem  Relation Age of Onset   Diabetes Mother    Cancer Brother     Past Surgical History:  Procedure Laterality Date   BACK SURGERY  1980's   lower   COLONOSCOPY N/A 07/09/2014   Procedure: COLONOSCOPY;  Surgeon: Lear Ng, MD;  [facility];  Service: Endoscopy;  Laterality: N/A;   COLONOSCOPY WITH PROPOFOL N/A 10/15/2018   Procedure: COLONOSCOPY WITH PROPOFOL;  Surgeon: Wilford Corner, MD;  [facility];  Service: Endoscopy;  Laterality: N/A;   HERNIA REPAIR  40 yrs ago   HOT HEMOSTASIS N/A 07/09/2014   Procedure: HOT HEMOSTASIS (ARGON PLASMA COAGULATION/BICAP);  Surgeon: Lear Ng, MD;  Location: [facility];  Service: Endoscopy;  Laterality: N/A;   POLYPECTOMY  10/15/2018   Procedure: POLYPECTOMY;  Surgeon: Wilford Corner, MD;  [facility];  Service: Endoscopy;;   REPLACEMENT TOTAL KNEE BILATERAL     TOTAL HIP ARTHROPLASTY Right 10/26/2020   Procedure: RIGHT TOTAL HIP ARTHROPLASTY ANTERIOR APPROACH;  Surgeon: Meredith Pel, MD;  Location: [facility];  Service: Orthopedics;  Laterality: Right;   TOTAL KNEE ARTHROPLASTY Bilateral    Social History   Occupational History   Not on file  Tobacco Use   Smoking status: Former Smoker   Smokeless tobacco: Current User    Types: Snuff  Vaping Use   Vaping Use: Never used  Substance and  Sexual Activity   Alcohol use: Yes    Comment: rare use   Drug use: No   Sexual activity: Yes

## 2021-10-21 ENCOUNTER — Ambulatory Visit: Payer: Medicare Other | Admitting: Surgical

## 2021-10-21 ENCOUNTER — Ambulatory Visit: Payer: Self-pay

## 2021-10-21 ENCOUNTER — Encounter: Payer: Self-pay | Admitting: Surgical

## 2021-10-21 ENCOUNTER — Other Ambulatory Visit: Payer: Self-pay

## 2021-10-21 DIAGNOSIS — Z96641 Presence of right artificial hip joint: Secondary | ICD-10-CM | POA: Diagnosis not present

## 2021-10-21 NOTE — Progress Notes (Signed)
Office Visit Note   Patient: Bobby Maxwell.           Date of Birth: 1945-12-27           MRN: [id number] Visit Date: 10/21/2021 Requested by: Ar[place]ia [place],  [place] [address] PCP: [facility].  Subjective: Chief Complaint  Patient presents with   Right Hip - Pain    HPI: Bobby Maxwell. is a 75 y.o. male who presents to the office complaining of left elbow pain and right hip pain.  Patient states that he had swelling of the left olecranon bursa that began about 1 month ago.  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  Denies any fevers, chills, night sweats, malaise.  No redness or heat coming from the swelling.  Swelling has significantly improved and is continuing to improve over the last several weeks.  That is not his major concern today  His main concern today is right hip pain after he jumped down from a truck bed at work about a week ago.  He initially noticed a fair amount of groin pain that caused him to walk with a slight limp but this pain is rapidly improved over the last week to the point where it is almost back to his baseline level of pain in the hip.  He does have history of right total hip arthroplasty.  Denies any significant swelling or bruising or change in the appearance of the incision since the injury.  He has always been able to bear weight on the extremity since the injury.  Leg is not giving out on him.  Denies radicular pain.  No weakness in the leg..                ROS: All systems reviewed are negative as they relate to the chief complaint within the history of present illness.  Patient denies fevers or chills.  Assessment & Plan: Visit Diagnoses:  1. S/P total right hip arthroplasty     Plan: Patient is a 75 year old male who presents for evaluation of right hip pain ongoing 1 week.  Began after he jumped down from a truck bed.   Pain has been significantly improving.  No bruising or swelling.  Almost back to his baseline level of pain.  No significant change on right hip radiographs compared with last set of radiographs.  Plan is to have him return to work and if he does not notice significant improvement of pain in the next 3 to 4 weeks, he will return to the office for repeat evaluation.  Counseled him on the signs and symptoms of septic olecranon bursitis and he will call the office if he starts to notice any signs concerning for infection.  As of now, seems to be resolving by itself with no evidence of infection today.  Recommended he avoid aggravating activities like resting the elbow up on hard surfaces and he may use compression over the elbow which may be helpful.  Follow-up as needed.  Follow-Up Instructions: No follow-ups on file.   Orders:  Orders Placed This Encounter  Procedures   XR HIP UNILAT W OR W/O PELVIS 2-3 VIEWS RIGHT   No orders of the defined types were placed in this encounter.     Procedures: No procedures performed   Clinical Data: No additional findings.  Objective: Vital Signs: There were  no vitals taken for this visit.  Physical Exam:  Constitutional: Patient appears well-developed HEENT:  Head: Normocephalic Eyes:EOM are normal Neck: Normal range of motion Cardiovascular: Normal rate Pulmonary/chest: Effort normal Neurologic: Patient is alert Skin: Skin is warm Psychiatric: Patient has normal mood and affect  Ortho Exam: Ortho exam demonstrates left elbow with mild olecranon bursitis.  There is no fluctuance of the bursa, all seems to be solid tissue.  No redness or warmth.  Really no tenderness over the bursa either.  No pain with elbow range of motion and he has full active and passive range of motion of the left elbow today.  Right hip demonstrates very minimal groin pain elicited with passive internal rotation and hip flexion.  He has 5+5 motor strength of bilateral hip  flexion, quadricep, hamstring, dorsiflexion, plantarflexion.  No change in the appearance of the incision with no erythema or dehiscence or sinus tract noted.  Specialty Comments:  No specialty comments available.  Imaging: No results found.   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 ([place]) 10/09/2019   Benign prostatic hyperplasia 10/03/2019   Hypertension associated with diabetes ([place]) 10/03/2019   Type 2 diabetes mellitus without complication, without long-term current use of insulin ([place]) 10/03/2019   Personal history of colonic polyps 10/15/2018   Coccyodynia 01/23/2017   Pain in right wrist 01/23/2017   Benign neoplasm of colon 07/09/2014   Past Medical History:  Diagnosis Date   Arthritis    Diabetes mellitus without complication ([facility])    GERD (gastroesophageal reflux disease)    History of kidney stones 2021   Hypertension     Family History  Problem Relation Age of Onset   Diabetes Mother    Cancer Brother     Past Surgical History:  Procedure Laterality Date   BACK SURGERY  1980's   lower   COLONOSCOPY N/A 07/09/2014   Procedure: COLONOSCOPY;  Surgeon: Lear Ng, MD;  [facility];  Service: Endoscopy;  Laterality: N/A;   COLONOSCOPY WITH PROPOFOL N/A 10/15/2018   Procedure: COLONOSCOPY WITH PROPOFOL;  Surgeon: Wilford Corner, MD;  [facility];  Service: Endoscopy;  Laterality: N/A;   HERNIA REPAIR  40 yrs ago   HOT HEMOSTASIS N/A 07/09/2014   Procedure: HOT HEMOSTASIS (ARGON PLASMA COAGULATION/BICAP);  Surgeon: Lear Ng, MD;  Location: [facility];  Service: Endoscopy;  Laterality: N/A;   POLYPECTOMY  10/15/2018   Procedure: POLYPECTOMY;  Surgeon: Wilford Corner, MD;  [facility];  Service:  Endoscopy;;   REPLACEMENT TOTAL KNEE BILATERAL     TOTAL HIP ARTHROPLASTY Right 10/26/2020   Procedure: RIGHT TOTAL HIP ARTHROPLASTY ANTERIOR APPROACH;  Surgeon: Meredith Pel, MD;  Location: [facility];  Service: Orthopedics;  Laterality: Right;   TOTAL KNEE ARTHROPLASTY Bilateral    Social History   Occupational History   Not on file  Tobacco Use   Smoking status: Former   Smokeless tobacco: Current    Types: Snuff  Vaping Use   Vaping Use: Never used  Substance and Sexual Activity   Alcohol use: Yes    Comment: rare use   Drug use: No   Sexual activity: Yes

## 2021-12-19 IMAGING — RF DG C-ARM 1-60 MIN
1 series · 2 of 2 positions shown · non-contrast
Comparison: Hip radiographs 07/26/2020.

CLINICAL DATA: Right hip replacement.

EXAM:
DG C-ARM 1-60 MIN; OPERATIVE RIGHT HIP WITH PELVIS
FLUOROSCOPY TIME:  Fluoroscopy Time:  39 seconds

[Series 1: unknown protocol · 0.20mm/px · 2 of 2 slices shown]
[im 1/2]
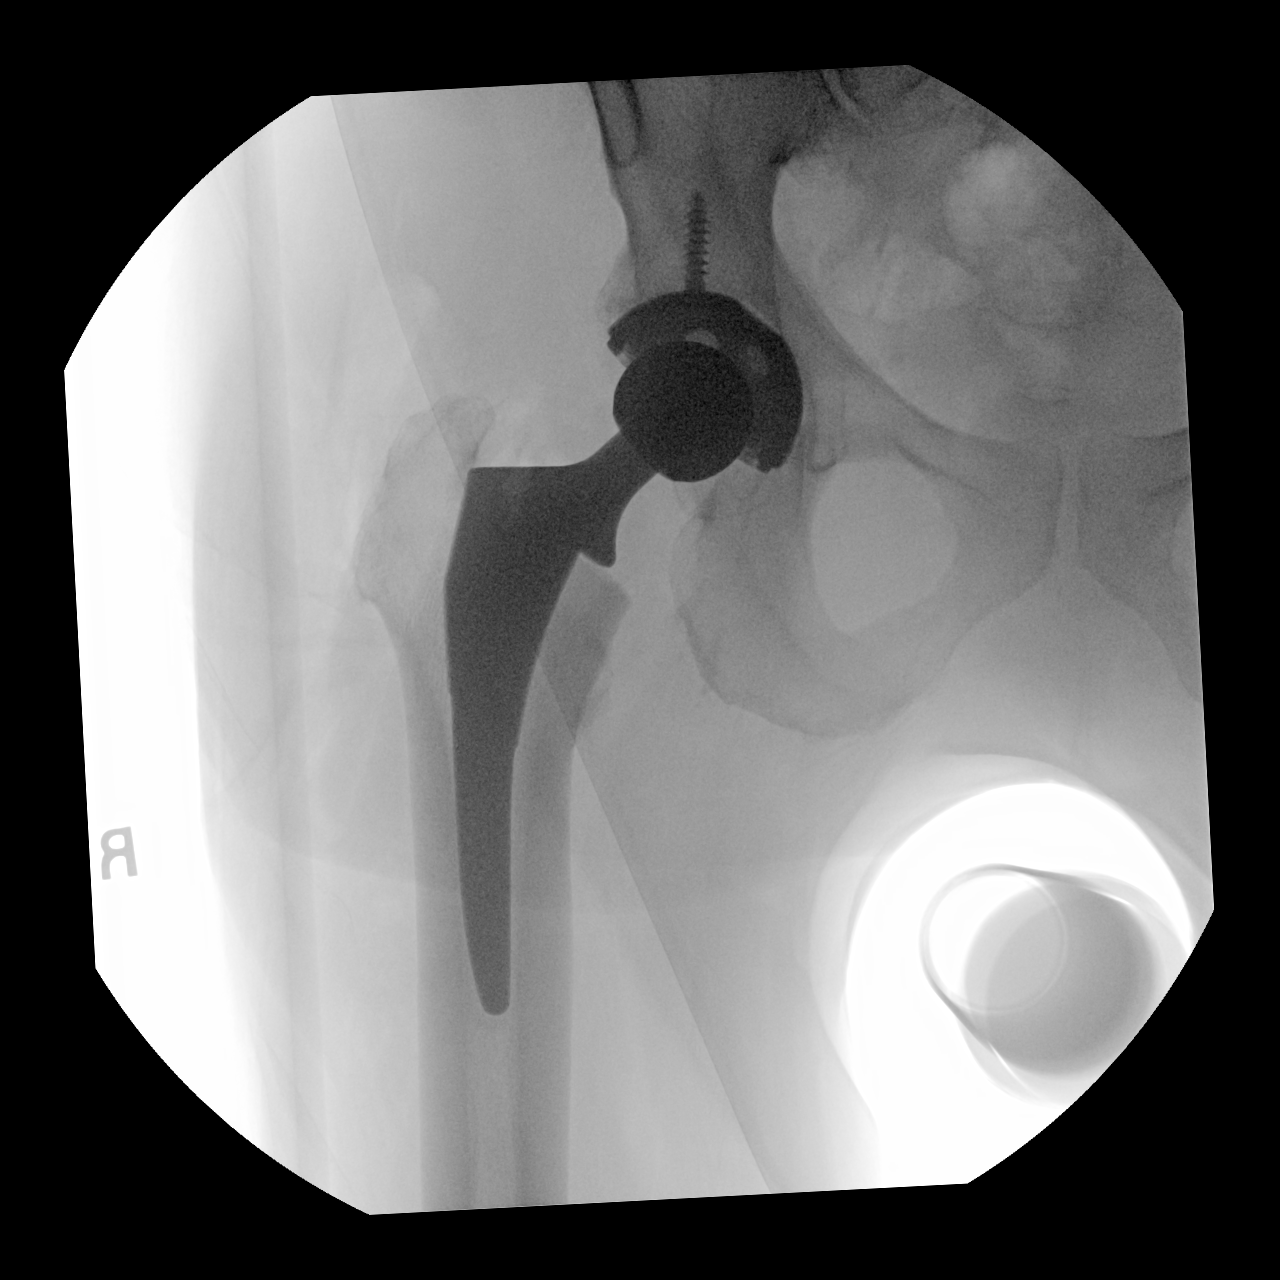
[im 2/2]
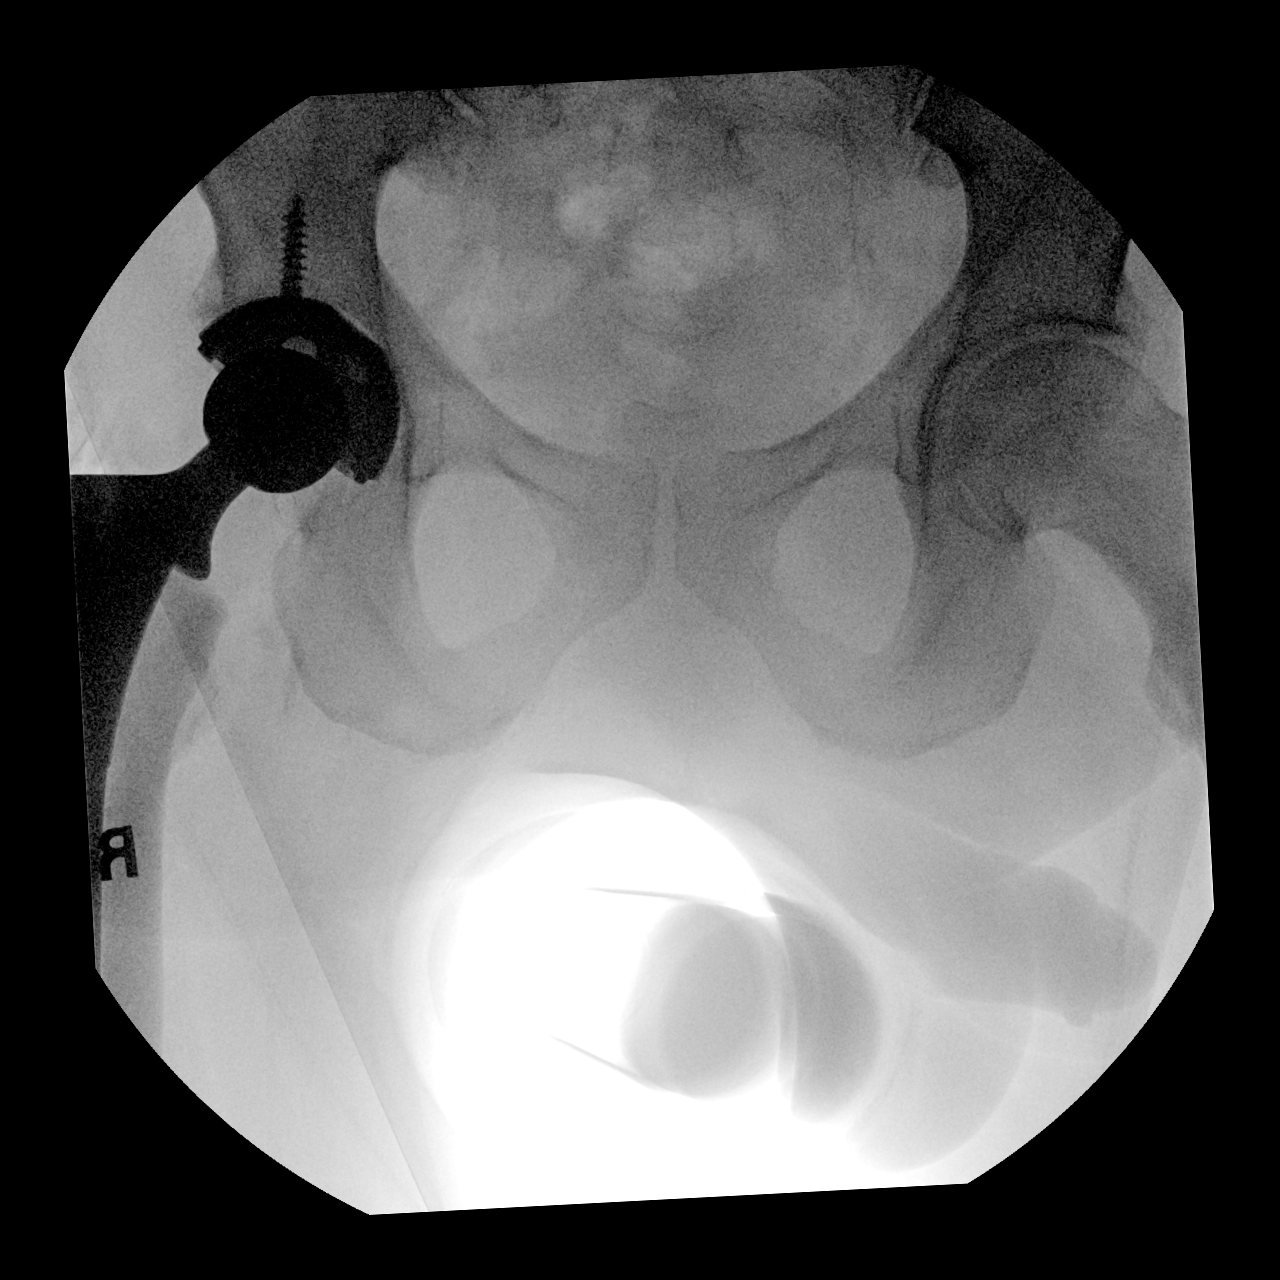

[2 of 2 positions shown; findings below may reference images not displayed]

FINDINGS: Two C-arm fluoroscopic images were obtained intraoperatively and
submitted for post operative interpretation. These images
demonstrate right total hip arthroplasty without unexpected
findings. Please see the performing provider's procedural report for
further detail.
IMPRESSION: Intraoperative fluoroscopic images, as detailed above.

## 2022-05-29 ENCOUNTER — Encounter: Payer: Self-pay | Admitting: Surgical

## 2022-05-29 ENCOUNTER — Ambulatory Visit: Payer: Medicare PPO | Admitting: Surgical

## 2022-05-29 ENCOUNTER — Ambulatory Visit (INDEPENDENT_AMBULATORY_CARE_PROVIDER_SITE_OTHER): Payer: Medicare PPO

## 2022-05-29 ENCOUNTER — Ambulatory Visit: Payer: Self-pay

## 2022-05-29 DIAGNOSIS — R2240 Localized swelling, mass and lump, unspecified lower limb: Secondary | ICD-10-CM

## 2022-05-29 DIAGNOSIS — M79672 Pain in left foot: Secondary | ICD-10-CM

## 2022-05-29 DIAGNOSIS — M25512 Pain in left shoulder: Secondary | ICD-10-CM

## 2022-05-29 DIAGNOSIS — M19012 Primary osteoarthritis, left shoulder: Secondary | ICD-10-CM

## 2022-05-29 MED ORDER — LIDOCAINE HCL 1 % IJ SOLN
5.0000 mL | INTRAMUSCULAR | Status: AC | PRN
  Administered 2022-05-29: 5 mL

## 2022-05-29 MED ORDER — METHYLPREDNISOLONE ACETATE 40 MG/ML IJ SUSP
40.0000 mg | INTRAMUSCULAR | Status: AC | PRN
  Administered 2022-05-29: 40 mg via INTRA_ARTICULAR

## 2022-05-29 MED ORDER — BUPIVACAINE HCL 0.5 % IJ SOLN
9.0000 mL | INTRAMUSCULAR | Status: AC | PRN
  Administered 2022-05-29: 9 mL via INTRA_ARTICULAR

## 2022-05-29 NOTE — Progress Notes (Signed)
Office Visit Note   Patient: Bobby Maxwell.           Date of Birth: Dec 17, 1946           MRN: [id number] Visit Date: 05/29/2022 Requested by: [place],  [place] [address] PCP: [place].  Subjective: Chief Complaint  Patient presents with   Left Shoulder - Pain    HPI: Azel Gumina. is a 76 y.o. male who presents to the office complaining of left shoulder pain.  Patient states that he has noticed left shoulder pain over the last 3 months without injury.  Localizes most of the pain to the anterior aspect of the left shoulder.  Pain wakes him up at night.  He is right-hand dominant.  He has difficulty with ADLs at times and has noticed decreased range of motion of the left shoulder compared with the right.  Pain is worse with inclement weather.  No history of prior pain in the shoulder recently but he does have history of prior arthroscopic left shoulder surgery for "bone spur removal" about 25 years ago.  No history of shoulder dislocation.  Denies any neck pain, radicular pain, numbness/tingling in the arm, scapular pain.  No right-sided symptoms.  He has tried naproxen, Voltaren gel, IcyHot topical with little relief.  He also complains of a "knot" on his left foot on the medial side of the IP joint of his great toe.  Does not cause him pain.  No fevers or chills.  It has been here for several months and he is just curious about what it is.              ROS: All systems reviewed are negative as they relate to the chief complaint within the history of present illness.  Patient denies fevers or chills.  Assessment & Plan: Visit Diagnoses:  1. Left shoulder pain, unspecified chronicity   2. Mass of great toe   3. Primary osteoarthritis, left shoulder     Plan: Patient is a 76 year old male who presents for evaluation of left shoulder pain primarily.  He has had increased pain over the last 3 months  without injury.  He has increased stiffness on exam with excellent rotator cuff strength.  Radiographs of the left shoulder taken today demonstrate severe left shoulder glenohumeral arthritis with loss of joint space and periarticular osteophyte formation.  Discussed the options available to patient.  He would like to try glenohumeral injection today to see how much relief this will give him.  He has not had an injection in his shoulder before that he can recall.  Under ultrasound guidance, glenohumeral injection was successfully delivered and patient tolerated the procedure well.  He will follow-up with the office as needed if pain does not improve or if pain returns after initial improvement of his shoulder pain.  He is not really wanting to consider shoulder replacement at this point in time but he understands that he may need this as he has needed hip and knee replacements.  Regarding the great toe, there seems to be some chronic fragmentation in the medial aspect of the IP joint.  He does not have any history of injury or falling onto the toe.  Not really bothering him so plan to just leave this alone but he understands that he may reach out to the office if this becomes more symptomatic for him.  Additionally, there was an incidental identification of  9 mm calcified pulmonary nodule in the midportion of the left lung.  This was discussed with the patient and recommended follow-up with his primary doctor.  He agreed with this plan and stated that he will make an appoint with his primary doctor to have this worked up.  He does have a history of smoking but has not smoked in 30 years.  Follow-Up Instructions: No follow-ups on file.   Orders:  Orders Placed This Encounter  Procedures   XR Shoulder Left   XR Foot Complete Left   US Guided Needle Placement - No Linked Charges   No orders of the defined types were placed in this encounter.     Procedures: Large Joint Inj: L glenohumeral on  05/29/2022 1:06 PM Indications: diagnostic evaluation and pain Details: 18 G 3.5 in needle, ultrasound-guided posterior approach  Arthrogram: No  Medications: 9 mL bupivacaine 0.5 %; 40 mg methylPREDNISolone acetate 40 MG/ML; 5 mL lidocaine 1 % Outcome: tolerated well, no immediate complications Procedure, treatment alternatives, risks and benefits explained, specific risks discussed. Consent was given by the patient. Immediately prior to procedure a time out was called to verify the correct patient, procedure, equipment, support staff and site/side marked as required. Patient was prepped and draped in the usual sterile fashion.       Clinical Data: No additional findings.  Objective: Vital Signs: There were no vitals taken for this visit.  Physical Exam:  Constitutional: Patient appears well-developed HEENT:  Head: Normocephalic Eyes:EOM are normal Neck: Normal range of motion Cardiovascular: Normal rate Pulmonary/chest: Effort normal Neurologic: Patient is alert Skin: Skin is warm Psychiatric: Patient has normal mood and affect  Ortho Exam: Ortho exam demonstrates very minimally tender bony prominence on the medial aspect of the great toe.  He has decreased extension of the IP joint of the toe compared with the contralateral toe.  Decreased flexion of the toe as well.  No redness, warmth, sinus tract noted.  No tenderness throughout the rest of the foot.  Left shoulder with 30 degrees external rotation, 70 degrees abduction, 95 degrees forward flexion.  This compared with the right shoulder with 50 degrees external rotation, 80 degrees abduction, 130 degrees forward flexion.  Excellent rotator cuff strength of supra, infra, subscap rated 5/5.  Moderate tenderness over the bicipital groove.  No tenderness over the [facility] joint.  5/5 motor strength of bilateral grip strength, finger abduction, pronation/supination, bicep, tricep, deltoid.  No tenderness throughout the axial cervical  spine.  Negative Lhermitte sign.  Negative Spurling sign.  He does have a gear shifting sensation with passive motion of the shoulder with crepitus consistent with osteoarthritis.  Specialty Comments:  No specialty comments available.  Imaging: No results found.   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 ([place]) 10/09/2019   Benign prostatic hyperplasia 10/03/2019   Hypertension associated with diabetes ([place]) 10/03/2019   Type 2 diabetes mellitus without complication, without long-term current use of insulin ([facility]) 10/03/2019   Personal history of colonic polyps 10/15/2018   Coccyodynia 01/23/2017   Pain in right wrist 01/23/2017   Benign neoplasm of colon 07/09/2014   Past Medical History:  Diagnosis Date   Arthritis    Diabetes mellitus without complication (HCC)    GERD (gastroesophageal reflux disease)    History of kidney stones 2021  Hypertension     Family History  Problem Relation Age of Onset   Diabetes Mother    Cancer Brother     Past Surgical History:  Procedure Laterality Date   BACK SURGERY  1980's   lower   COLONOSCOPY N/A 07/09/2014   Procedure: COLONOSCOPY;  Surgeon: Lear Ng, MD;  Location: [facility];  Service: Endoscopy;  Laterality: N/A;   COLONOSCOPY WITH PROPOFOL N/A 10/15/2018   Procedure: COLONOSCOPY WITH PROPOFOL;  Surgeon: Wilford Corner, MD;  Location: [facility];  Service: Endoscopy;  Laterality: N/A;   HERNIA REPAIR  40 yrs ago   HOT HEMOSTASIS N/A 07/09/2014   Procedure: HOT HEMOSTASIS (ARGON PLASMA COAGULATION/BICAP);  Surgeon: Lear Ng, MD;  Location: [facility];  Service: Endoscopy;  Laterality: N/A;   POLYPECTOMY  10/15/2018   Procedure: POLYPECTOMY;  Surgeon: Wilford Corner, MD;  Location: [facility]  ENDOSCOPY;  Service: Endoscopy;;   REPLACEMENT TOTAL KNEE BILATERAL     TOTAL HIP ARTHROPLASTY Right 10/26/2020   Procedure: RIGHT TOTAL HIP ARTHROPLASTY ANTERIOR APPROACH;  Surgeon: Meredith Pel, MD;  Location: [facility];  Service: Orthopedics;  Laterality: Right;   TOTAL KNEE ARTHROPLASTY Bilateral    Social History   Occupational History   Not on file  Tobacco Use   Smoking status: Former   Smokeless tobacco: Current    Types: Snuff  Vaping Use   Vaping Use: Never used  Substance and Sexual Activity   Alcohol use: Yes    Comment: rare use   Drug use: No   Sexual activity: Yes

## 2022-06-05 ENCOUNTER — Other Ambulatory Visit: Payer: Self-pay | Admitting: Physician Assistant

## 2022-06-05 DIAGNOSIS — R911 Solitary pulmonary nodule: Secondary | ICD-10-CM

## 2022-09-03 ENCOUNTER — Emergency Department (HOSPITAL_COMMUNITY): Payer: Medicare PPO

## 2022-09-03 ENCOUNTER — Emergency Department (HOSPITAL_COMMUNITY)
Admission: EM | Admit: 2022-09-03 | Discharge: 2022-09-03 | Disposition: A | Discharge status: Home / Self Care | Payer: Medicare PPO | Attending: Emergency Medicine | Admitting: Emergency Medicine

## 2022-09-03 ENCOUNTER — Encounter (HOSPITAL_COMMUNITY): Payer: Self-pay

## 2022-09-03 DIAGNOSIS — R112 Nausea with vomiting, unspecified: Secondary | ICD-10-CM | POA: Diagnosis not present

## 2022-09-03 DIAGNOSIS — R0789 Other chest pain: Secondary | ICD-10-CM | POA: Diagnosis not present

## 2022-09-03 DIAGNOSIS — Z7982 Long term (current) use of aspirin: Secondary | ICD-10-CM | POA: Insufficient documentation

## 2022-09-03 DIAGNOSIS — R0602 Shortness of breath: Secondary | ICD-10-CM | POA: Diagnosis not present

## 2022-09-03 DIAGNOSIS — R1010 Upper abdominal pain, unspecified: Secondary | ICD-10-CM | POA: Diagnosis not present

## 2022-09-03 DIAGNOSIS — R072 Precordial pain: Secondary | ICD-10-CM | POA: Diagnosis present

## 2022-09-03 HISTORY — DX: Type 2 diabetes mellitus without complications: E11.9

## 2022-09-03 HISTORY — DX: Hyperlipidemia, unspecified: E78.5

## 2022-09-03 LAB — HEPATIC FUNCTION PANEL
ALT: 25 U/L (ref 0–44)
AST: 21 U/L (ref 15–41)
Albumin: 4.2 g/dL (ref 3.5–5.0)
Alkaline Phosphatase: 49 U/L (ref 38–126)
Bilirubin, Direct: 0.1 mg/dL (ref 0.0–0.2)
Indirect Bilirubin: 0.6 mg/dL (ref 0.3–0.9)
Total Bilirubin: 0.7 mg/dL (ref 0.3–1.2)
Total Protein: 7.1 g/dL (ref 6.5–8.1)

## 2022-09-03 LAB — CBC
HCT: 43 % (ref 39.0–52.0)
Hemoglobin: 14.9 g/dL (ref 13.0–17.0)
MCH: 32.3 pg (ref 26.0–34.0)
MCHC: 34.7 g/dL (ref 30.0–36.0)
MCV: 93.3 fL (ref 80.0–100.0)
Platelets: 230 10*3/uL (ref 150–400)
RBC: 4.61 MIL/uL (ref 4.22–5.81)
RDW: 13.2 % (ref 11.5–15.5)
WBC: 6.8 10*3/uL (ref 4.0–10.5)
nRBC: 0 % (ref 0.0–0.2)

## 2022-09-03 LAB — TROPONIN I (HIGH SENSITIVITY)
Troponin I (High Sensitivity): 5 ng/L (ref <18)
Troponin I (High Sensitivity): 5 ng/L (ref <18)

## 2022-09-03 LAB — BASIC METABOLIC PANEL
Anion gap: 7 (ref 5–15)
BUN: 12 mg/dL (ref 8–23)
CO2: 23 mmol/L (ref 22–32)
Calcium: 9 mg/dL (ref 8.9–10.3)
Chloride: 105 mmol/L (ref 98–111)
Creatinine, Ser: 0.7 mg/dL (ref 0.61–1.24)
GFR, Estimated: >60 mL/min (ref >60)
Glucose, Bld: 229 mg/dL — ABNORMAL HIGH (ref 70–99)
Potassium: 3.7 mmol/L (ref 3.5–5.1)
Sodium: 135 mmol/L (ref 135–145)

## 2022-09-03 LAB — D-DIMER, QUANTITATIVE: D-Dimer, Quant: 1.77 ug/mL-FEU — ABNORMAL HIGH (ref 0.00–0.50)

## 2022-09-03 LAB — PROTIME-INR
INR: 1 (ref 0.8–1.2)
Prothrombin Time: 13 seconds (ref 11.4–15.2)

## 2022-09-03 LAB — LIPASE, BLOOD: Lipase: 30 U/L (ref 11–51)

## 2022-09-03 MED ORDER — ONDANSETRON HCL 4 MG PO TABS: 4.0000 mg | ORAL_TABLET | Freq: Three times a day (TID) | ORAL | 0 refills | Status: AC | PRN

## 2022-09-03 MED ORDER — LIDOCAINE VISCOUS HCL 2 % MT SOLN
15.0000 mL | Freq: Once | OROMUCOSAL | Status: AC
  Administered 2022-09-03: 15 mL via OROMUCOSAL
  Filled 2022-09-03: qty 15

## 2022-09-03 MED ORDER — ALUM & MAG HYDROXIDE-SIMETH 200-200-20 MG/5ML PO SUSP
30.0000 mL | Freq: Once | ORAL | Status: AC
  Administered 2022-09-03: 30 mL via ORAL
  Filled 2022-09-03: qty 30

## 2022-09-03 MED ORDER — FAMOTIDINE IN NACL 20-0.9 MG/50ML-% IV SOLN
20.0000 mg | Freq: Once | INTRAVENOUS | Status: AC
  Administered 2022-09-03: 20 mg via INTRAVENOUS
  Filled 2022-09-03: qty 50

## 2022-09-03 NOTE — Discharge Instructions (Signed)
You are seen in the emergency department for nausea vomiting chest and abdominal pain.  You had lab work EKG chest x-ray and right upper quadrant ultrasound that did not show an obvious explanation for your pain.  Your symptoms improved although did not completely resolved.  You wanted to be discharged and follow-up with your regular doctor.  We are prescribing you some nausea medication.  Please continue your regular medication and follow-up with your primary care doctor.  Return to the emergency department if any worsening or concerning symptoms.

## 2022-09-03 NOTE — ED Notes (Signed)
Pt provided with ice water and saltine crackers

## 2022-09-03 NOTE — ED Provider Notes (Signed)
[facility] DEPT Provider Note   CSN: [id number] Arrival date & time: 09/03/22  3976     History  Chief Complaint  Patient presents with  . Chest Pain  . Shortness of Breath    Bobby Maxwell. is a 76 y.o. male.  He said after he drank some coffee this morning he had acute onset of chest and upper abdominal pain, shortness of breath, vomiting and sweats.  Rates the pain as 4 out of 10 worse with taking a deep breath and moving.  No prior history of cardiac disease.  No recent illness.  No blood in the vomitus no diarrhea.  Does not feel short of breath.   The history is provided by the patient.  Chest Pain Pain location:  Substernal area, L chest and R chest Pain quality: aching   Pain radiates to:  Does not radiate Pain severity:  Moderate Onset quality:  Sudden Duration:  1 hour Timing:  Constant Progression:  Unchanged Chronicity:  New Relieved by:  None tried Worsened by:  Deep breathing and movement Ineffective treatments:  None tried Associated symptoms: diaphoresis, nausea, shortness of breath and vomiting   Associated symptoms: no back pain, no cough, no fever, no numbness, no syncope and no weakness   Risk factors: diabetes mellitus, hypertension and smoking   Shortness of Breath Associated symptoms: chest pain, diaphoresis and vomiting   Associated symptoms: no cough, no fever, no neck pain, no sore throat and no syncope        Home Medications Prior to Admission medications   Medication Sig Start Date End Date Taking? Authorizing Provider  aspirin EC 81 MG tablet Take 81 mg by mouth daily.    [provider]  Cholecalciferol (VITAMIN D3) 2000 units TABS Take 2,000 Units by mouth daily.    [provider]  CINNAMON PO Take 1 capsule by mouth daily. Patient not taking: Reported on 10/15/2020    [provider]  gabapentin (NEURONTIN) 100 MG capsule Take 1 capsule (100 mg total) by mouth 3  (three) times daily as needed. 10/28/20   Magnant, Charles L, PA-C  glipiZIDE (GLUCOTROL XL) 10 MG 24 hr tablet Take 10 mg by mouth daily with breakfast.    [provider]  lisinopril-hydrochlorothiazide (ZESTORETIC) 20-12.5 MG tablet Take 1 tablet by mouth every morning.     [provider]  loratadine (CLARITIN) 10 MG tablet Take 10 mg by mouth daily as needed for allergies.    [provider]  metFORMIN (GLUCOPHAGE) 500 MG tablet Take 1,000 mg by mouth 2 (two) times daily with a meal.    [provider]  methocarbamol (ROBAXIN) 500 MG tablet Take 1 tablet (500 mg total) by mouth every 8 (eight) hours as needed for muscle spasms. 10/28/20   Magnant, Gerrianne Scale, PA-C  Multiple Vitamin (MULTIVITAMIN WITH MINERALS) TABS tablet Take 1 tablet by mouth daily.    [provider]  oxycodone (OXY-IR) 5 MG capsule Take 1 capsule (5 mg total) by mouth every 4 (four) hours as needed. 10/29/20   Magnant, Gerrianne Scale, PA-C  tamsulosin (FLOMAX) 0.4 MG CAPS capsule Take 0.4 mg by mouth at bedtime.     [provider]  traMADol (ULTRAM) 50 MG tablet Take 1 tablet (50 mg total) by mouth daily as needed. Patient not taking: Reported on 10/15/2020 09/03/20   Magnant, Gerrianne Scale, PA-C  TURMERIC PO Take 1 capsule by mouth daily. Patient not taking: Reported on 10/15/2020  [provider]      Allergies    Ivp dye [iodinated contrast media]    Review of Systems   Review of Systems  Constitutional:  Positive for diaphoresis. Negative for fever.  HENT:  Negative for sore throat.   Eyes:  Negative for visual disturbance.  Respiratory:  Positive for shortness of breath. Negative for cough.   Cardiovascular:  Positive for chest pain. Negative for syncope.  Gastrointestinal:  Positive for nausea and vomiting.  Musculoskeletal:  Negative for back pain and neck pain.  Neurological:  Negative for weakness and numbness.    Physical Exam Updated Vital  Signs BP 119/80 (BP Location: Left Arm)   Pulse 70   Temp 97.7 F (36.5 C) (Oral)   Resp 19   Ht '5\' 7"'$  (1.702 m)   Wt 86.2 kg   SpO2 94%   BMI 29.76 kg/m  Physical Exam Vitals and nursing note reviewed.  Constitutional:      General: He is not in acute distress.    Appearance: Normal appearance. He is well-developed. He is not ill-appearing.  HENT:     Head: Normocephalic and atraumatic.  Eyes:     Conjunctiva/sclera: Conjunctivae normal.  Cardiovascular:     Rate and Rhythm: Normal rate and regular rhythm.     Heart sounds: Normal heart sounds. No murmur heard. Pulmonary:     Effort: Pulmonary effort is normal. No respiratory distress.     Breath sounds: Normal breath sounds.  Abdominal:     Palpations: Abdomen is soft.     Tenderness: There is no abdominal tenderness. There is no guarding or rebound.  Musculoskeletal:        General: No swelling. Normal range of motion.     Cervical back: Neck supple.     Right lower leg: No tenderness. No edema.     Left lower leg: No tenderness. No edema.  Skin:    General: Skin is warm and dry.     Capillary Refill: Capillary refill takes less than 2 seconds.  Neurological:     General: No focal deficit present.     Mental Status: He is alert.     Motor: No weakness.     Gait: Gait normal.     ED Results / Procedures / Treatments   Labs (all labs ordered are listed, but only abnormal results are displayed) Labs Reviewed  BASIC METABOLIC PANEL - Abnormal; Notable for the following components:      Result Value   Glucose, Bld 229 (*)    All other components within normal limits  D-DIMER, QUANTITATIVE - Abnormal; Notable for the following components:   D-Dimer, Quant 1.77 (*)    All other components within normal limits  CBC  HEPATIC FUNCTION PANEL  LIPASE, BLOOD  PROTIME-INR  TROPONIN I (HIGH SENSITIVITY)  TROPONIN I (HIGH SENSITIVITY)    EKG EKG Interpretation  Date/Time:  Sunday September 03 2022 07:10:59  EDT Ventricular Rate:  80 PR Interval:  150 QRS Duration: 105 QT Interval:  391 QTC Calculation: 451 R Axis:   25 Text Interpretation: Sinus rhythm Ventricular premature complex Borderline low voltage, extremity leads Abnormal R-wave progression, early transition increased ectopy from prior 11/21 Confirmed by Aletta Edouard [phone]) on 09/03/2022 7:13:38 AM  Radiology US Abdomen Limited RUQ (LIVER/GB)  Result Date: 09/03/2022 CLINICAL DATA:  Right upper quadrant pain. EXAM: ULTRASOUND ABDOMEN LIMITED RIGHT UPPER QUADRANT COMPARISON:  06/30/2020 FINDINGS: Gallbladder: No gallstones or wall thickening visualized. No sonographic Murphy sign noted  by sonographer. Common bile duct: Diameter: 3 mm Liver: 1.7 cm cystic lesion in the left hepatic lobe is seen previously is unchanged in the interval, consistent with benign cyst. Liver parenchyma appears echogenic suggesting fatty deposition. Portal vein is patent on color Doppler imaging with normal direction of blood flow towards the liver. Other: None. IMPRESSION: No acute findings. No features to explain the patient's history of right upper quadrant pain. Electronically Signed   By: Misty Stanley M.D.   On: 09/03/2022 12:40   DG Chest 2 View  Result Date: 09/03/2022 CLINICAL DATA:  76 year old male with mid chest pain and shortness of breath this morning. EXAM: CHEST - 2 VIEW COMPARISON:  Portable chest 07/09/2020 and earlier. FINDINGS: PA and lateral views at 0728 hours. Tracheostomy removed since 2021. Lung volumes and mediastinal contours are within normal limits. Visualized tracheal air column is within normal limits. Low lung volumes on the lateral. Both lungs appear clear. No pneumothorax or pleural effusion. No acute osseous abnormality identified. Flowing thoracic endplate osteophytes. Air-fluid level in the stomach but otherwise negative visible bowel gas. IMPRESSION: No acute cardiopulmonary abnormality. Electronically Signed   By: Genevie Ann M.D.    On: 09/03/2022 07:42    Procedures Procedures    Medications Ordered in ED Medications  alum & mag hydroxide-simeth (MAALOX/MYLANTA) 200-200-20 MG/5ML suspension 30 mL (30 mLs Oral Given 09/03/22 0923)  lidocaine (XYLOCAINE) 2 % viscous mouth solution 15 mL (15 mLs Mouth/Throat Given 09/03/22 0923)  famotidine (PEPCID) IVPB 20 mg premix (0 mg Intravenous Stopped 09/03/22 1201)    ED Course/ Medical Decision Making/ A&P Clinical Course as of 09/03/22 1800  Sun Sep 03, 2022  0908 Patient with elevated D-dimer no priors.  He does still endorse chest pain and pain worse with deep breathing.  Saturations reasonable at 93% not tachycardic.  He has a contrast allergy to IVP dye in the 1980s which she said was swelling and hives.  He is not sure if there was any breathing difficulties at that time.  Discussed with Dr. Nevada Crane radiology on-call.  He said nuclear tech not available on the weekend and so a VQ scan would not be able to be performed.  He felt that the contrast was much more strong back in the 80s and it would be reasonable to pretreat and get a CT angiogram if clinically indicated.  [MB]  1024 Patient states GI cocktail gave him some improvement.  He still notices some discomfort when he swallows and in his upper abdomen.  Does not feel short of breath. [MB]  3295 Right upper quadrant ultrasound interpreted by me as no gross gallstones or thickening.  Awaiting radiology reading. [MB]  1346 Patient has eaten and drank here.  He denies feeling short of breath.  He still has some upper abdominal pain when he takes a deep breath but he also states he would like to go home.  He has not been hypoxic or tachypneic and denies feeling short of breath. [MB]    Clinical Course User Index [MB] Hayden Rasmussen, MD                           Medical Decision Making Amount and/or Complexity of Data Reviewed Labs: ordered. Radiology: ordered.  Risk OTC drugs. Prescription drug management.  This  patient complains of chest and abdominal pain nausea vomiting; this involves an extensive number of treatment Options and is a complaint that carries with it a  high risk of complications and morbidity. The differential includes ACS, reflux, peptic ulcer disease, cholelithiasis, cholecystitis, perforation, pneumothorax, PE, vascular  I ordered, reviewed and interpreted labs, which included CBC with normal white count normal hemoglobin, chemistries normal other than elevated glucose, LFTs and lipase normal, troponins flat, D-dimer elevated no priors to compare with I ordered medication IV Pepcid GI cocktail with improvement in his symptoms and reviewed PMP when indicated. I ordered imaging studies which included chest x-ray and right upper quadrant ultrasound and I independently    visualized and interpreted imaging which showed no acute findings Previous records obtained and reviewed in epic no recent admissions Cardiac monitoring reviewed, normal sinus rhythm Social determinants considered, ongoing tobacco use Critical Interventions: None  After the interventions stated above, I reevaluated the patient and found patient to be minimally symptomatic and comfortable in bed. Admission and further testing considered, I reviewed with the patient possibility of blood clot and that we could do pretreatment to get a contrast scan.  Patient declines this at this time and said he would follow-up with his primary care doctor.  He understands his condition can get worse.  He said he is feeling better and would rather follow-up with his doctor.  He understands he can return if he changes his mind or feels worse.           Final Clinical Impression(s) / ED Diagnoses Final diagnoses:  Atypical chest pain  Upper abdominal pain  Nausea and vomiting, unspecified vomiting type    Rx / DC Orders ED Discharge Orders     None         Hayden Rasmussen, MD 09/03/22 1800

## 2022-09-03 NOTE — ED Triage Notes (Signed)
Pt states that after waking up this morning he began having midsternal non-radiating CP and had one episode of emesis and diaphoresis. Pt states pain has somewhat subsided during triage, but that he still feels slight dyspnea.

## 2022-09-18 ENCOUNTER — Institutional Professional Consult (permissible substitution): Payer: Medicare HMO | Admitting: Plastic Surgery

## 2022-09-20 ENCOUNTER — Encounter: Payer: Self-pay | Admitting: Plastic Surgery

## 2022-09-20 ENCOUNTER — Ambulatory Visit: Payer: Medicare PPO | Admitting: Plastic Surgery

## 2022-09-20 VITALS — BP 120/75 | Ht 68.0 in | Wt 188.0 lb

## 2022-09-20 DIAGNOSIS — N6320 Unspecified lump in the left breast, unspecified quadrant: Secondary | ICD-10-CM

## 2022-09-20 DIAGNOSIS — R234 Changes in skin texture: Secondary | ICD-10-CM

## 2022-09-20 DIAGNOSIS — R928 Other abnormal and inconclusive findings on diagnostic imaging of breast: Secondary | ICD-10-CM | POA: Diagnosis not present

## 2022-09-20 NOTE — Progress Notes (Signed)
Referring Provider [place],  [place] [address]   CC:  Chief Complaint  Patient presents with   Consult      Bobby Maxwell. is an 76 y.o. male.  HPI: Bobby Maxwell who prefers to be called Bobby Maxwell was referred for evaluation of a mass the left breast.  He underwent a chest x-ray as part of a work-up for shoulder pain approximately 3 months ago and at that time he was noted to have a lesion on his left upper chest.  He relates that he has undergone significant work-up since that time including MRI mammogram and biopsy of the area in question.  He is referred for surgical management of his of his breast lesion.  Allergies  Allergen Reactions   Ivp Dye [Iodinated Contrast Media] Anaphylaxis, Hives and Swelling    Outpatient Encounter Medications as of 09/20/2022  Medication Sig   aspirin EC 81 MG tablet Take 81 mg by mouth daily.   Cholecalciferol (VITAMIN D3) 2000 units TABS Take 2,000 Units by mouth daily.   gabapentin (NEURONTIN) 100 MG capsule Take 1 capsule (100 mg total) by mouth 3 (three) times daily as needed.   glipiZIDE (GLUCOTROL XL) 10 MG 24 hr tablet Take 10 mg by mouth daily with breakfast.   lisinopril (ZESTRIL) 20 MG tablet Take 20 mg by mouth daily.   loratadine (CLARITIN) 10 MG tablet Take 10 mg by mouth daily as needed for allergies.   metFORMIN (GLUCOPHAGE) 500 MG tablet Take 1,000 mg by mouth 2 (two) times daily with a meal.   methocarbamol (ROBAXIN) 500 MG tablet Take 1 tablet (500 mg total) by mouth every 8 (eight) hours as needed for muscle spasms.   Multiple Vitamin (MULTIVITAMIN WITH MINERALS) TABS tablet Take 1 tablet by mouth daily.   ondansetron (ZOFRAN) 4 MG tablet Take 1 tablet (4 mg total) by mouth every 8 (eight) hours as needed for nausea or vomiting.   oxycodone (OXY-IR) 5 MG capsule Take 1 capsule (5 mg total) by mouth every 4 (four) hours as needed.   tamsulosin (FLOMAX) 0.4 MG CAPS capsule Take  0.4 mg by mouth at bedtime.    traMADol (ULTRAM) 50 MG tablet Take 1 tablet (50 mg total) by mouth daily as needed.   [DISCONTINUED] CINNAMON PO Take 1 capsule by mouth daily.   [DISCONTINUED] TURMERIC PO Take 1 capsule by mouth daily.   lisinopril-hydrochlorothiazide (ZESTORETIC) 20-12.5 MG tablet Take 1 tablet by mouth every morning.    No facility-administered encounter medications on file as of 09/20/2022.     Past Medical History:  Diagnosis Date   Arthritis    Diabetes mellitus without complication (HCC)    GERD (gastroesophageal reflux disease)    History of kidney stones 2021   Hyperlipidemia    Hypertension    T2DM (type 2 diabetes mellitus) [facility])     Past Surgical History:  Procedure Laterality Date   BACK SURGERY  1980's   lower   COLONOSCOPY N/A 07/09/2014   Procedure: COLONOSCOPY;  Surgeon: Lear Ng, MD;  Location: [facility];  Service: Endoscopy;  Laterality: N/A;   COLONOSCOPY WITH PROPOFOL N/A 10/15/2018   Procedure: COLONOSCOPY WITH PROPOFOL;  Surgeon: Wilford Corner, MD;  Location: [facility];  Service: Endoscopy;  Laterality: N/A;   HERNIA REPAIR  40 yrs ago   HOT HEMOSTASIS N/A 07/09/2014   Procedure: HOT HEMOSTASIS (ARGON PLASMA COAGULATION/BICAP);  Surgeon: Lear Ng, MD;  Location: [facility];  Service: Endoscopy;  Laterality: N/A;   POLYPECTOMY  10/15/2018   Procedure: POLYPECTOMY;  Surgeon: Wilford Corner, MD;  Location: [facility];  Service: Endoscopy;;   REPLACEMENT TOTAL KNEE BILATERAL     TOTAL HIP ARTHROPLASTY Right 10/26/2020   Procedure: RIGHT TOTAL HIP ARTHROPLASTY ANTERIOR APPROACH;  Surgeon: Meredith Pel, MD;  Location: [facility];  Service: Orthopedics;  Laterality: Right;   TOTAL KNEE ARTHROPLASTY Bilateral     Family History  Problem Relation Age of Onset   Diabetes Mother    Cancer Brother     Social History   Social History Narrative   Not on file     Review of Systems General: Denies fevers,  chills, weight loss CV: Denies chest pain, shortness of breath, palpitations Breast: He denies pain or drainage from the nipple, in fact he did not know that the lesion was present until it was found with the chest x-ray. Endocrine: Well-controlled non-insulin-dependent diabetes  Physical Exam    09/20/2022    8:11 AM 09/03/2022    1:45 PM 09/03/2022    1:30 PM  Vitals with BMI  Height '5\' 8"'$     Weight 188 lbs    BMI 42.68    Systolic 341 962 229  Diastolic 75 798 62  Pulse  82 68    General:  No acute distress,  Alert and oriented, Non-Toxic, Normal speech and affect On physical exam Bobby Maxwell is awake alert well engaged and in no distress. Breast: Left breast he has a very small amount of fibrous tissue in the retroareolar position.  There are no dominant masses that I appreciate on physical exam.  The skin overlying the breast and the nipple and areola complex are normal in appearance.    Assessment/Plan Chest wall mass: The patient does not have any appreciable mass of concern on my physical exam, however, he relates undergoing MRI mammogram and biopsy of the area which does show that there is some type of pathology.  Unfortunately, none of these records are available.  He is been given a medical release form so that all of the records can be transferred to [place] and I can evaluate them.  Once I have the records he will return and we will make a further plan including scheduling surgery if that is indicated.  He understands all of this and is very happy to proceed in this manner.  Camillia Herter 09/20/2022, 8:48 AM

## 2022-10-05 ENCOUNTER — Encounter: Payer: Self-pay | Admitting: Plastic Surgery

## 2022-10-06 ENCOUNTER — Ambulatory Visit: Payer: Medicare PPO | Admitting: Plastic Surgery

## 2022-10-13 ENCOUNTER — Other Ambulatory Visit: Payer: Self-pay | Admitting: General Surgery

## 2022-10-17 ENCOUNTER — Telehealth: Payer: Self-pay

## 2022-10-17 NOTE — Telephone Encounter (Signed)
Spoke to [place] and adv that we didn't have any imaging results for the pt. She stated that she found it scanned in [facility].

## 2022-10-17 NOTE — Telephone Encounter (Signed)
-----   Message from Camillia Herter, MD sent at 10/13/2022  5:04 PM EDT ----- Regarding: RE: Provider Message Bobby Maxwell,  I couldn't find the imaging either. I actually put in my note that he told me it had been done but I couldn't find any record of it. All I had was the pathology report.  Barnabas Lister ----- Message ----- From: Lindon Romp, CMA Sent: 10/13/2022   4:55 PM EDT To: Camillia Herter, MD Subject: Provider Message                               Merleen Nicely from [facility] left voicemail stating that she found the pathology report that confirms pt's Dx. She stated that she can't find any imaging and if you found any could you have our office fax it to her at [phone] at her attention. Her call back # is [phone].   Thanks!   Bobby Maxwell, Bobby Maxwell

## 2022-11-23 ENCOUNTER — Encounter (HOSPITAL_BASED_OUTPATIENT_CLINIC_OR_DEPARTMENT_OTHER): Payer: Self-pay | Admitting: General Surgery

## 2022-11-23 ENCOUNTER — Other Ambulatory Visit: Payer: Self-pay

## 2022-11-27 ENCOUNTER — Encounter (HOSPITAL_BASED_OUTPATIENT_CLINIC_OR_DEPARTMENT_OTHER)
Admission: RE | Admit: 2022-11-27 | Discharge: 2022-11-27 | Disposition: A | Discharge status: Home / Self Care | Payer: Medicare PPO | Source: Ambulatory Visit | Attending: General Surgery | Admitting: General Surgery

## 2022-11-27 DIAGNOSIS — Z01812 Encounter for preprocedural laboratory examination: Secondary | ICD-10-CM | POA: Insufficient documentation

## 2022-11-27 DIAGNOSIS — E119 Type 2 diabetes mellitus without complications: Secondary | ICD-10-CM | POA: Diagnosis not present

## 2022-11-27 LAB — BASIC METABOLIC PANEL
Anion gap: 11 (ref 5–15)
BUN: 11 mg/dL (ref 8–23)
CO2: 25 mmol/L (ref 22–32)
Calcium: 9.1 mg/dL (ref 8.9–10.3)
Chloride: 103 mmol/L (ref 98–111)
Creatinine, Ser: 0.75 mg/dL (ref 0.61–1.24)
GFR, Estimated: >60 mL/min (ref >60)
Glucose, Bld: 130 mg/dL — ABNORMAL HIGH (ref 70–99)
Potassium: 3.8 mmol/L (ref 3.5–5.1)
Sodium: 139 mmol/L (ref 135–145)

## 2022-11-27 NOTE — Progress Notes (Signed)

## 2022-11-29 NOTE — H&P (Signed)
REFERRING PHYSICIAN:  Lovena Le   PROVIDER:  Georgianne Fick, MD   Care Team: Patient Care Team: Dorothy Spark, [place] as PCP - General (Internal Medicine) Georgianne Fick, MD as Consulting Provider (Surgical Oncology)    MRN: [id number] DOB: May 30, 1946    Subjective    Chief Complaint: Breast Mass   History of Present Illness: Bobby Maxwell. is a 76 y.o. male who is seen today as an office consultation at the request of Dr. Lovena Le for evaluation of Breast Mass   Patient is a 76 year old male who was getting worked up for a left shoulder replacement with Dr. Marlou Sa.  He was found to have a suspicious lesion in the left breast.  This was evaluated and found to be a one 4 cm microlobulated mass with increased blood flow.  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  The patient denies any breast pain.  The patient has no family history of cancers.  The patient can vaguely feel the mass.  He has not noted it enlarging.     Family cancer history-none   Work- truck Geophysicist/field seismologist half days   Diagnostic mammogram/us [facility] 06/28/2022 COMPARISON: CT chest 20/08/2022   FINDINGS: The breast tissue is almost entirely fat (<25% glandular). In  the upper inner left breast anterior to mid depth there is a 1.1 cm mass.  No findings for malignancy in the right breast.   Left breast ultrasound was performed for further evaluation. In the upper  inner left breast 3 cm from the nipple there is a 1.4 cm microlobulated  suspicious hypoechoic solid mass with markedly increased blood flow.  Mammographic and sonographic findings correlate. This accounts for the  mass seen at CT. Ultrasound-guided core needle biopsy is recommended.  Ultrasound of the left axilla is normal.       IMPRESSION: Suspicious solid mass upper inner left breast. Nothing for  malignancy right breast.   RECOMMENDATION: Ultrasound-guided core needle biopsy of the suspicious  solid left  breast mass. We will perform this today.   ACR BI-RADS CATEGORY 4 -- Suspicious.  Letter Sent:  Abnormal Exam.    Pathology core needle biopsy: 06/28/2022 [facility]   Breast, left upper inner quadrant (Top-Hat clip), ultrasound-guided needle core biopsy:   -Myofibroblastoma, cellular variant, see comment.   COMMENT: Immunohistochemical stains are positive for CD34, BCL-2, weakly positive for SMA and negative for STAT6, S100 and desmin     Review of Systems: A complete review of systems was obtained from the patient.  I have reviewed this information and discussed as appropriate with the patient.  See HPI as well for other ROS.   Medical History: Past Medical History  History reviewed. No pertinent past medical history.        Patient Active Problem List  Diagnosis   Left breast mass, (Myofibroblastoma)      Past Surgical History       Past Surgical History:  Procedure Laterality Date   JOINT REPLACEMENT            Allergies      Allergies  Allergen Reactions   Iodinated Contrast Media Anaphylaxis, Hives and Swelling              Current Outpatient Medications on File Prior to Visit  Medication Sig Dispense Refill   gabapentin (NEURONTIN) 100 MG capsule Take by mouth 2 (two) times daily as needed       metFORMIN (GLUCOPHAGE-XR) 500 MG XR tablet Take  by mouth        No current facility-administered medications on file prior to visit.      Family History       Family History  Problem Relation Age of Onset   Diabetes Mother          Social History       Tobacco Use  Smoking Status Never  Smokeless Tobacco Never      Social History  Social History        Socioeconomic History   Marital status: Unknown  Tobacco Use   Smoking status: Never   Smokeless tobacco: Never  Substance and Sexual Activity   Alcohol use: Not Currently   Drug use: Never        Objective:         Vitals:      BP: 100/80  Pulse: 78  Temp: 36.7 C (98 F)  SpO2: 97%   Weight: 87.1 kg (192 lb)  Height: 172.7 cm ('5\' 8"'$ )    Body mass index is 29.19 kg/m.   Gen:  No acute distress.  Well nourished and well groomed.   Neurological: Alert and oriented to person, place, and time. Coordination normal.  Head: Normocephalic and atraumatic.  Eyes: Conjunctivae are normal. Pupils are equal, round, and reactive to light. No scleral icterus.  Neck: Normal range of motion. Neck supple. No tracheal deviation or thyromegaly present.  Cardiovascular: Normal rate, regular rhythm, normal heart sounds and intact distal pulses.  Exam reveals no gallop and no friction rub.  No murmur heard. Breast: left breast with vaguely palpable mass UIQ near nipple.  Mobile.  Non tender.  No overlying skin changes.  Right breast nl.  No LAD.   Respiratory: Effort normal.  No respiratory distress. No chest wall tenderness. Breath sounds normal.  No wheezes, rales or rhonchi.  GI: Soft. Bowel sounds are normal. The abdomen is soft and nontender.  There is no rebound and no guarding.  Musculoskeletal: Normal range of motion. Extremities are nontender.  Lymphadenopathy: No cervical, preauricular, postauricular or axillary adenopathy is present Skin: Skin is warm and dry. No rash noted. No diaphoresis. No erythema. No pallor. No clubbing, cyanosis, or edema.   Psychiatric: Normal mood and affect. Behavior is normal. Judgment and thought content normal.      Labs CMET 10/09/2022 normal other than gluc 194.    Assessment and Plan:        ICD-10-CM    1. Left breast mass, (Myofibroblastoma)  D24.2         Patient will need this excised.  This is most likely benign I will not need any other treatment other than surgery.  I did discuss with the patient there is a small chance that malignancy would be found.  I described that I would make an incision over the mass and remove the mass with a good margin of tissue around it.  I discussed that he may have a divot in that location.  He was  excepting of that information.  I discussed risks of surgery including bleeding, infection, fluid buildup, pain.  He would like to do this in December.  He is planning on getting his left shoulder replaced in January.   We discussed time off work is approximately a week.  We also discussed risk of heart or lung issues.  He is not on any blood thinners that he will not need to hold any of his medications.

## 2022-11-30 ENCOUNTER — Other Ambulatory Visit: Payer: Self-pay

## 2022-11-30 ENCOUNTER — Ambulatory Visit (HOSPITAL_BASED_OUTPATIENT_CLINIC_OR_DEPARTMENT_OTHER): Payer: Medicare PPO | Admitting: Anesthesiology

## 2022-11-30 ENCOUNTER — Ambulatory Visit (HOSPITAL_BASED_OUTPATIENT_CLINIC_OR_DEPARTMENT_OTHER)
Admission: RE | Admit: 2022-11-30 | Discharge: 2022-11-30 | Disposition: A | Discharge status: Home / Self Care | Payer: Medicare PPO | Attending: General Surgery | Admitting: General Surgery

## 2022-11-30 ENCOUNTER — Encounter (HOSPITAL_BASED_OUTPATIENT_CLINIC_OR_DEPARTMENT_OTHER): Payer: Self-pay | Admitting: General Surgery

## 2022-11-30 ENCOUNTER — Encounter (HOSPITAL_BASED_OUTPATIENT_CLINIC_OR_DEPARTMENT_OTHER)
Admission: RE | Disposition: A | Discharge status: Home / Self Care | Payer: Self-pay | Source: Home / Self Care | Attending: General Surgery

## 2022-11-30 DIAGNOSIS — E119 Type 2 diabetes mellitus without complications: Secondary | ICD-10-CM | POA: Insufficient documentation

## 2022-11-30 DIAGNOSIS — Z7984 Long term (current) use of oral hypoglycemic drugs: Secondary | ICD-10-CM | POA: Insufficient documentation

## 2022-11-30 DIAGNOSIS — Z87891 Personal history of nicotine dependence: Secondary | ICD-10-CM

## 2022-11-30 DIAGNOSIS — N6320 Unspecified lump in the left breast, unspecified quadrant: Secondary | ICD-10-CM

## 2022-11-30 DIAGNOSIS — I10 Essential (primary) hypertension: Secondary | ICD-10-CM | POA: Diagnosis not present

## 2022-11-30 DIAGNOSIS — N6322 Unspecified lump in the left breast, upper inner quadrant: Secondary | ICD-10-CM | POA: Insufficient documentation

## 2022-11-30 HISTORY — PX: MASS EXCISION: SHX2000

## 2022-11-30 LAB — GLUCOSE, CAPILLARY
Glucose-Capillary: 127 mg/dL — ABNORMAL HIGH (ref 70–99)
Glucose-Capillary: 146 mg/dL — ABNORMAL HIGH (ref 70–99)

## 2022-11-30 SURGERY — EXCISION MASS
Anesthesia: General | Site: Breast | Laterality: Left

## 2022-11-30 MED ORDER — DEXAMETHASONE SODIUM PHOSPHATE 4 MG/ML IJ SOLN
INTRAMUSCULAR | Status: DC | PRN
  Administered 2022-11-30: 5 mg via INTRAVENOUS

## 2022-11-30 MED ORDER — BUPIVACAINE HCL (PF) 0.25 % IJ SOLN
INTRAMUSCULAR | Status: AC
  Filled 2022-11-30: qty 120

## 2022-11-30 MED ORDER — EPHEDRINE SULFATE (PRESSORS) 50 MG/ML IJ SOLN
INTRAMUSCULAR | Status: DC | PRN
  Administered 2022-11-30: 20 mg via INTRAVENOUS

## 2022-11-30 MED ORDER — HYDROCODONE-ACETAMINOPHEN 5-325 MG PO TABS: 1.0000 | ORAL_TABLET | Freq: Four times a day (QID) | ORAL | 0 refills | Status: DC | PRN

## 2022-11-30 MED ORDER — LIDOCAINE-EPINEPHRINE (PF) 1 %-1:200000 IJ SOLN
INTRAMUSCULAR | Status: DC | PRN
  Administered 2022-11-30: 30 mL via INTRAMUSCULAR

## 2022-11-30 MED ORDER — PROPOFOL 10 MG/ML IV BOLUS
INTRAVENOUS | Status: DC | PRN
  Administered 2022-11-30: 150 mg via INTRAVENOUS

## 2022-11-30 MED ORDER — FENTANYL CITRATE (PF) 100 MCG/2ML IJ SOLN: 25.0000 ug | INTRAMUSCULAR | Status: DC | PRN

## 2022-11-30 MED ORDER — FENTANYL CITRATE (PF) 100 MCG/2ML IJ SOLN
INTRAMUSCULAR | Status: AC
  Filled 2022-11-30: qty 2

## 2022-11-30 MED ORDER — LIDOCAINE-EPINEPHRINE (PF) 1 %-1:200000 IJ SOLN
INTRAMUSCULAR | Status: AC
  Filled 2022-11-30: qty 120

## 2022-11-30 MED ORDER — LACTATED RINGERS IV SOLN: INTRAVENOUS | Status: DC

## 2022-11-30 MED ORDER — AMISULPRIDE (ANTIEMETIC) 5 MG/2ML IV SOLN: 10.0000 mg | Freq: Once | INTRAVENOUS | Status: DC | PRN

## 2022-11-30 MED ORDER — MIDAZOLAM HCL 2 MG/2ML IJ SOLN
INTRAMUSCULAR | Status: AC
  Filled 2022-11-30: qty 2

## 2022-11-30 MED ORDER — CHLORHEXIDINE GLUCONATE CLOTH 2 % EX PADS: 6.0000 | MEDICATED_PAD | Freq: Once | CUTANEOUS | Status: DC

## 2022-11-30 MED ORDER — FENTANYL CITRATE (PF) 100 MCG/2ML IJ SOLN
INTRAMUSCULAR | Status: DC | PRN
  Administered 2022-11-30 (×2): 50 ug via INTRAVENOUS

## 2022-11-30 MED ORDER — ONDANSETRON HCL 4 MG/2ML IJ SOLN: 4.0000 mg | Freq: Once | INTRAMUSCULAR | Status: DC | PRN

## 2022-11-30 MED ORDER — LIDOCAINE HCL (CARDIAC) PF 100 MG/5ML IV SOSY
PREFILLED_SYRINGE | INTRAVENOUS | Status: DC | PRN
  Administered 2022-11-30: 60 mg via INTRAVENOUS

## 2022-11-30 MED ORDER — MIDAZOLAM HCL 5 MG/5ML IJ SOLN
INTRAMUSCULAR | Status: DC | PRN
  Administered 2022-11-30: 1 mg via INTRAVENOUS

## 2022-11-30 MED ORDER — GLYCOPYRROLATE 0.2 MG/ML IJ SOLN
INTRAMUSCULAR | Status: DC | PRN
  Administered 2022-11-30: .2 mg via INTRAVENOUS

## 2022-11-30 MED ORDER — CEFAZOLIN SODIUM-DEXTROSE 2-4 GM/100ML-% IV SOLN
INTRAVENOUS | Status: AC
  Filled 2022-11-30: qty 100

## 2022-11-30 MED ORDER — ACETAMINOPHEN 500 MG PO TABS
1000.0000 mg | ORAL_TABLET | Freq: Once | ORAL | Status: AC
  Administered 2022-11-30: 1000 mg via ORAL

## 2022-11-30 MED ORDER — GABAPENTIN 300 MG PO CAPS
300.0000 mg | ORAL_CAPSULE | ORAL | Status: AC
  Administered 2022-11-30: 300 mg via ORAL

## 2022-11-30 MED ORDER — ACETAMINOPHEN 500 MG PO TABS
ORAL_TABLET | ORAL | Status: AC
  Filled 2022-11-30: qty 2

## 2022-11-30 MED ORDER — CEFAZOLIN SODIUM-DEXTROSE 2-4 GM/100ML-% IV SOLN
2.0000 g | INTRAVENOUS | Status: AC
  Administered 2022-11-30: 2 g via INTRAVENOUS

## 2022-11-30 MED ORDER — GABAPENTIN 300 MG PO CAPS
ORAL_CAPSULE | ORAL | Status: AC
  Filled 2022-11-30: qty 1

## 2022-11-30 SURGICAL SUPPLY — 47 items
ADH SKN CLS APL DERMABOND .7 (GAUZE/BANDAGES/DRESSINGS) ×1
APL PRP STRL LF DISP 70% ISPRP (MISCELLANEOUS) ×1
BLADE HEX COATED 2.75 (ELECTRODE) ×1 IMPLANT
BLADE SURG 10 STRL SS (BLADE) ×1 IMPLANT
BLADE SURG 15 STRL LF DISP TIS (BLADE) ×1 IMPLANT
BLADE SURG 15 STRL SS (BLADE)
CANISTER SUCT 1200ML W/VALVE (MISCELLANEOUS) IMPLANT
CHLORAPREP W/TINT 26 (MISCELLANEOUS) ×1 IMPLANT
COVER BACK TABLE 60X90IN (DRAPES) IMPLANT
COVER MAYO STAND STRL (DRAPES) IMPLANT
DERMABOND ADVANCED .7 DNX12 (GAUZE/BANDAGES/DRESSINGS) ×1 IMPLANT
DRAPE LAPAROSCOPIC ABDOMINAL (DRAPES) IMPLANT
DRAPE LAPAROTOMY 100X72 PEDS (DRAPES) IMPLANT
DRAPE UTILITY XL STRL (DRAPES) ×1 IMPLANT
ELECT REM PT RETURN 9FT ADLT (ELECTROSURGICAL) ×1
ELECTRODE REM PT RTRN 9FT ADLT (ELECTROSURGICAL) ×1 IMPLANT
GAUZE PAD ABD 8X10 STRL (GAUZE/BANDAGES/DRESSINGS) IMPLANT
GAUZE SPONGE 4X4 12PLY STRL LF (GAUZE/BANDAGES/DRESSINGS) ×1 IMPLANT
GLOVE BIO SURGEON STRL SZ 6 (GLOVE) ×1 IMPLANT
GLOVE BIOGEL PI IND STRL 6.5 (GLOVE) ×1 IMPLANT
GOWN STRL REUS W/ TWL LRG LVL3 (GOWN DISPOSABLE) ×1 IMPLANT
GOWN STRL REUS W/TWL 2XL LVL3 (GOWN DISPOSABLE) ×1 IMPLANT
GOWN STRL REUS W/TWL LRG LVL3 (GOWN DISPOSABLE) ×1
KIT MARKER MARGIN INK (KITS) IMPLANT
NDL HYPO 25X1 1.5 SAFETY (NEEDLE) ×1 IMPLANT
NEEDLE HYPO 25X1 1.5 SAFETY (NEEDLE) ×1 IMPLANT
NS IRRIG 1000ML POUR BTL (IV SOLUTION) IMPLANT
PACK BASIN DAY SURGERY FS (CUSTOM PROCEDURE TRAY) ×1 IMPLANT
PACK UNIVERSAL I (CUSTOM PROCEDURE TRAY) IMPLANT
PENCIL SMOKE EVACUATOR (MISCELLANEOUS) ×1 IMPLANT
SLEEVE SCD COMPRESS KNEE MED (STOCKING) IMPLANT
SPIKE FLUID TRANSFER (MISCELLANEOUS) IMPLANT
SPONGE T-LAP 18X18 ~~LOC~~+RFID (SPONGE) ×1 IMPLANT
STAPLER VISISTAT 35W (STAPLE) IMPLANT
STRIP CLOSURE SKIN 1/2X4 (GAUZE/BANDAGES/DRESSINGS) ×1 IMPLANT
SUT MNCRL AB 4-0 PS2 18 (SUTURE) ×1 IMPLANT
SUT SILK 3 0 TIES 17X18 (SUTURE)
SUT SILK 3-0 18XBRD TIE BLK (SUTURE) IMPLANT
SUT VIC AB 2-0 SH 27 (SUTURE) ×1
SUT VIC AB 2-0 SH 27XBRD (SUTURE) IMPLANT
SUT VIC AB 3-0 SH 27 (SUTURE) ×1
SUT VIC AB 3-0 SH 27X BRD (SUTURE) ×1 IMPLANT
SYR BULB EAR ULCER 3OZ GRN STR (SYRINGE) ×1 IMPLANT
SYR CONTROL 10ML LL (SYRINGE) ×1 IMPLANT
TOWEL GREEN STERILE FF (TOWEL DISPOSABLE) ×1 IMPLANT
TUBE CONNECTING 20X1/4 (TUBING) IMPLANT
YANKAUER SUCT BULB TIP NO VENT (SUCTIONS) IMPLANT

## 2022-11-30 NOTE — Interval H&P Note (Signed)
History and Physical Interval Note:  11/30/2022 8:45 AM  Bobby Maxwell.  has presented today for surgery, with the diagnosis of LEFT BREAST MASS.  The various methods of treatment have been discussed with the patient and family. After consideration of risks, benefits and other options for treatment, the patient has consented to  Procedure(s): EXCISION OF LEFT BREAST MASS (Left) as a surgical intervention.  The patient's history has been reviewed, patient examined, no change in status, stable for surgery.  I have reviewed the patient's chart and labs.  Questions were answered to the patient's satisfaction.     Stark Klein

## 2022-11-30 NOTE — Anesthesia Preprocedure Evaluation (Addendum)
Anesthesia Evaluation  Patient identified by MRN, date of birth, ID band Patient awake    Reviewed: Allergy & Precautions, NPO status , Patient's Chart, lab work & pertinent test results  Airway Mallampati: III  TM Distance: >3 FB Neck ROM: Full    Dental  (+) Loose, Missing,    Pulmonary former smoker   Pulmonary exam normal        Cardiovascular hypertension, Pt. on medications Normal cardiovascular exam     Neuro/Psych negative neurological ROS  negative psych ROS   GI/Hepatic Neg liver ROS,,,  Endo/Other  diabetes, Oral Hypoglycemic Agents    Renal/GU negative Renal ROS     Musculoskeletal  (+) Arthritis ,    Abdominal   Peds  Hematology negative hematology ROS (+)   Anesthesia Other Findings LEFT BREAST MASS  Reproductive/Obstetrics                             Anesthesia Physical Anesthesia Plan  ASA: 3  Anesthesia Plan: General   Post-op Pain Management:    Induction: Intravenous  PONV Risk Score and Plan: 2 and Ondansetron, Dexamethasone and Treatment may vary due to age or medical condition  Airway Management Planned: LMA  Additional Equipment:   Intra-op Plan:   Post-operative Plan: Extubation in OR  Informed Consent: I have reviewed the patients History and Physical, chart, labs and discussed the procedure including the risks, benefits and alternatives for the proposed anesthesia with the patient or authorized representative who has indicated his/her understanding and acceptance.     Dental advisory given  Plan Discussed with: CRNA  Anesthesia Plan Comments:        Anesthesia Quick Evaluation

## 2022-11-30 NOTE — Anesthesia Procedure Notes (Signed)
Procedure Name: LMA Insertion Date/Time: 11/30/2022 9:15 AM  Performed by: Willa Frater, CRNAPre-anesthesia Checklist: Patient identified, Emergency Drugs available, Suction available and Patient being monitored Patient Re-evaluated:Patient Re-evaluated prior to induction Oxygen Delivery Method: Circle System Utilized Preoxygenation: Pre-oxygenation with 100% oxygen Induction Type: IV induction Ventilation: Mask ventilation without difficulty LMA: LMA inserted LMA Size: 5.0 Number of attempts: 1 Airway Equipment and Method: bite block Placement Confirmation: positive ETCO2 Tube secured with: Tape Dental Injury: Teeth and Oropharynx as per pre-operative assessment

## 2022-11-30 NOTE — Discharge Instructions (Addendum)
[place] Office Phone Number [phone]  BREAST BIOPSY/ PARTIAL MASTECTOMY: POST OP INSTRUCTIONS  Always review your discharge instruction sheet given to you by the facility where your surgery was performed.  IF YOU HAVE DISABILITY OR FAMILY LEAVE FORMS, YOU MUST BRING THEM TO THE OFFICE FOR PROCESSING.  DO NOT GIVE THEM TO YOUR DOCTOR.  Take 2 tylenol (acetominophen) three times a day for 3 days.  If you still have pain, add ibuprofen with food in between if able to take this (if you have kidney issues or stomach issues, do not take ibuprofen).  If both of those are not enough, add the narcotic pain pill.  If you find you are needing a lot of this overnight after surgery, call the next morning for a refill.  No Tylenol today until after 2:00pm.  Prescriptions will not be filled after 5pm or on week-ends. Take your usually prescribed medications unless otherwise directed You should eat very light the first 24 hours after surgery, such as soup, crackers, pudding, etc.  Resume your normal diet the day after surgery. Most patients will experience some swelling and bruising in the breast.  Ice packs and a good support bra will help.  Swelling and bruising can take several days to resolve.  It is common to experience some constipation if taking pain medication after surgery.  Increasing fluid intake and taking a stool softener will usually help or prevent this problem from occurring.  A mild laxative (Milk of Magnesia or Miralax) should be taken according to package directions if there are no bowel movements after 48 hours. Unless discharge instructions indicate otherwise, you may remove your bandages 48 hours after surgery, and you may shower at that time.  You may have steri-strips (small skin tapes) in place directly over the incision.  These strips should be left on the skin at least for for 7-10 days.    ACTIVITIES:  You may resume regular daily activities (gradually increasing)  beginning the next day.  Wearing a good support bra or sports bra (or the breast binder) minimizes pain and swelling.  You may have sexual intercourse when it is comfortable. No heavy lifting for 1-2 weeks (not over around 10 pounds).  You may drive when you no longer are taking prescription pain medication, you can comfortably wear a seatbelt, and you can safely maneuver your car and apply brakes. RETURN TO WORK:  __________3-14 days depending on job. _______________ Dennis Bast should see your doctor in the office for a follow-up appointment approximately two weeks after your surgery.  Your doctor's nurse will typically make your follow-up appointment when she calls you with your pathology report.  Expect your pathology report 3-4 business days after your surgery.  You may call to check if you do not hear from [place] after three days.   WHEN TO CALL YOUR DOCTOR: Fever over 101.0 Nausea and/or vomiting. Extreme swelling or bruising. Continued bleeding from incision. Increased pain, redness, or drainage from the incision.  The clinic staff is available to answer your questions during regular business hours.  Please don't hesitate to call and ask to speak to one of the nurses for clinical concerns.  If you have a medical emergency, go to the nearest emergency room or call 911.  A surgeon from [facility] is always on call at the hospital.  For further questions, please visit [email or web address]   Post Anesthesia Home Care Instructions  Activity: Get plenty of rest for the remainder of the day. A  responsible individual must stay with you for 24 hours following the procedure.  For the next 24 hours, DO NOT: -Drive a car -Paediatric nurse -Drink alcoholic beverages -Take any medication unless instructed by your physician -Make any legal decisions or sign important papers.  Meals: Start with liquid foods such as gelatin or soup. Progress to regular foods as tolerated. Avoid greasy,  spicy, heavy foods. If nausea and/or vomiting occur, drink only clear liquids until the nausea and/or vomiting subsides. Call your physician if vomiting continues.  Special Instructions/Symptoms: Your throat may feel dry or sore from the anesthesia or the breathing tube placed in your throat during surgery. If this causes discomfort, gargle with warm salt water. The discomfort should disappear within 24 hours.  If you had a scopolamine patch placed behind your ear for the management of post- operative nausea and/or vomiting:  1. The medication in the patch is effective for 72 hours, after which it should be removed.  Wrap patch in a tissue and discard in the trash. Wash hands thoroughly with soap and water. 2. You may remove the patch earlier than 72 hours if you experience unpleasant side effects which may include dry mouth, dizziness or visual disturbances. 3. Avoid touching the patch. Wash your hands with soap and water after contact with the patch.

## 2022-11-30 NOTE — Anesthesia Postprocedure Evaluation (Signed)
Anesthesia Post Note  Patient: [name].  Procedure(s) Performed: EXCISION OF LEFT BREAST MASS (Left: Breast)     Patient location during evaluation: PACU Anesthesia Type: General Level of consciousness: awake Pain management: pain level controlled Vital Signs Assessment: post-procedure vital signs reviewed and stable Respiratory status: spontaneous breathing, nonlabored ventilation and respiratory function stable Cardiovascular status: blood pressure returned to baseline and stable Postop Assessment: no apparent nausea or vomiting Anesthetic complications: no   No notable events documented.  Last Vitals:  Vitals:   11/30/22 1045 11/30/22 1100  BP: 119/70 129/73  Pulse: 77 68  Resp: 13 16  Temp:  (!) 36.4 C  SpO2: 93% 96%    Last Pain:  Vitals:   11/30/22 1100  TempSrc:   PainSc: 0-No pain                 Liston Thum P Nijae Doyel

## 2022-11-30 NOTE — Transfer of Care (Signed)
Immediate Anesthesia Transfer of Care Note  Patient: Bobby Maxwell.  Procedure(s) Performed: EXCISION OF LEFT BREAST MASS (Left: Breast)  Patient Location: PACU  Anesthesia Type:General  Level of Consciousness: sedated  Airway & Oxygen Therapy: Patient connected to face mask oxygen  Post-op Assessment: Report given to RN and Post -op Vital signs reviewed and stable  Post vital signs: Reviewed and stable  Last Vitals:  Vitals Value Taken Time  BP    Temp    Pulse    Resp    SpO2      Last Pain:  Vitals:   11/30/22 0752  TempSrc: Oral  PainSc: 0-No pain      Patients Stated Pain Goal: 2 (73/57/89 7847)  Complications: No notable events documented.

## 2022-11-30 NOTE — Op Note (Signed)
Excisional Breast Biopsy  Indications: This patient presents with history of left breast mass, biopsy proven myofibroblastoma  Pre-operative Diagnosis: left breast mass  Post-operative Diagnosis: left breast mass  Surgeon: Stark Klein   Anesthesia: General LMA anesthesia and Local anesthesia 1% plain lidocaine, 0.25.% bupivacaine, with epinephrine  ASA Class: 3  Procedure Details  The patient was seen in the Holding Room. The risks, benefits, complications, treatment options, and expected outcomes were discussed with the patient. The possibilities of reaction to medication, pulmonary aspiration, bleeding, infection, the need for additional procedures, failure to diagnose a condition, and creating a complication requiring transfusion or operation were discussed with the patient. The patient concurred with the proposed plan, giving informed consent.  The site of surgery properly noted/marked. The patient was taken to Operating Room # 8, identified, and the procedure verified as Breast Excisional Biopsy. A Time Out was held and the above information confirmed.  After induction of anesthesia, the left  breast and chest were prepped and draped in standard fashion. The lumpectomy was performed by creating a transverse incision in the upper inner quadrant of the left breast  Dissection was carried down to the pectoralis fascia and underneath the skin.. The specimen was marked with the margin marker paint kit.  Marland Kitchen  Specimen radiography confirmed inclusion of the mammographic lesion.  Hemostasis was achieved with cautery.  The adjacent tissue was rearranged in order to minimize the defect. The deeper breast tissue was pulled together with 2-0 vicryl interrupted sutures.  The wound was irrigated and closed with a 3-0 Vicryl interrupted deep dermal stitch and a 4-0 Monocryl subcuticular closure in layers.    Sterile dressings were applied. At the end of the operation, all sponge, instrument, and needle  counts were correct.  Findings: grossly clear surgical margins, anterior margin of mass is skin, posterior margin is pectoralis  Estimated Blood Loss:  Minimal                Specimens: left breast mass to pathology         Complications:  None; patient tolerated the procedure well.         Disposition: PACU - hemodynamically stable.         Condition: stable

## 2022-12-01 ENCOUNTER — Encounter (HOSPITAL_BASED_OUTPATIENT_CLINIC_OR_DEPARTMENT_OTHER): Payer: Self-pay | Admitting: General Surgery

## 2022-12-04 ENCOUNTER — Telehealth: Payer: Self-pay

## 2022-12-04 LAB — SURGICAL PATHOLOGY

## 2022-12-04 NOTE — Telephone Encounter (Signed)
Error

## 2022-12-05 ENCOUNTER — Ambulatory Visit (INDEPENDENT_AMBULATORY_CARE_PROVIDER_SITE_OTHER): Payer: Medicare PPO

## 2022-12-05 ENCOUNTER — Ambulatory Visit (INDEPENDENT_AMBULATORY_CARE_PROVIDER_SITE_OTHER): Payer: Medicare PPO | Admitting: Physician Assistant

## 2022-12-05 ENCOUNTER — Encounter: Payer: Self-pay | Admitting: Physician Assistant

## 2022-12-05 DIAGNOSIS — M21372 Foot drop, left foot: Secondary | ICD-10-CM | POA: Insufficient documentation

## 2022-12-05 DIAGNOSIS — M5442 Lumbago with sciatica, left side: Secondary | ICD-10-CM

## 2022-12-05 DIAGNOSIS — G8929 Other chronic pain: Secondary | ICD-10-CM

## 2022-12-05 NOTE — Progress Notes (Signed)
Office Visit Note   Patient: Bobby Maxwell.           Date of Birth: May 18, 1946           MRN: [id number] Visit Date: 12/05/2022              Requested by: [place],  [place] [address] PCP: [facility]  Chief Complaint  Patient presents with   Lower Back - Pain      HPI: Mr. Flakes is a pleasant 76 year old gentleman who is a patient of Dr. Randel Pigg.  He presents today with a chief complaint of low back pain with associated numbness in his left foot.  He feels unsteady and is using a walker which is not usually what he does.  He denies any particular injury.  He does have a remote history of back surgery in the 1980s.  Denies any loss of bowel or bladder control  Assessment & Plan: Visit Diagnoses:  1. Chronic midline low back pain with left-sided sciatica   2. Foot drop, left     Plan: X-rays demonstrate quite a bit of degenerative changes especially at L4-5 and L5-S1 with almost complete loss of joint space.  I do have concern that he does have weakness in his left foot with a developing left foot drop.  This paresthesia is localized to the fourth and fifth toes.  Because of the new onset foot drop is causing him an antalgic gait.  I have recommended a stat MRI and follow-up with Dr. Laurance Flatten for evaluation.  Patient understands if he has any developing symptoms such as loss of bowel or bladder control or his leg giving out he is to call the emergency room or contact us immediately.  He does have a history of diabetes  Follow-Up Instructions: No follow-ups on file.   Ortho Exam  Patient is alert, oriented, no adenopathy, well-dressed, normal affect, normal respiratory effort. He is ambulating with the use of a walker and has an antalgic gait without proper pushoff on the left ankle.  Compartments are soft and nontender.  He does have focal pain over the lower back.  He has a well-healed surgical incision  incision over the mid lower back without any erythema.  He has some tenderness over the L5-S1 interface.  He has negative straight leg raise test reproduce some of the symptoms in his lower back.  On the right side he has 5 out of 5 strength with resisted dorsiflexion plantarflexion eversion and inversion and his sensation is intact on the left side he has good plantarflexion but very weak dorsiflexion.  Pulses intact.  His toes are warm and pink with brisk capillary refill but he does have altered sensation in the fourth and fifth digits  Imaging: No results found. No images are attached to the encounter.  Labs: Lab Results  Component Value Date   HGBA1C 7.3 (H) 10/26/2020   ESRSEDRATE 28 (H) 01/18/2018   LABURIC 5.5 01/18/2018   REPTSTATUS 10/22/2020 FINAL 10/20/2020   CULT  10/20/2020    NO GROWTH Performed at [facility], [place] [address], [place], [place] [address]      Lab Results  Component Value Date   ALBUMIN 4.2 09/03/2022   ALBUMIN 3.3 (L) 06/14/2014    No results found for: "MG" No results found for: "VD25OH"  No results found for: "PREALBUMIN"    Latest Ref Rng & Units 09/03/2022  7:26 AM 10/20/2020    3:30 PM 01/18/2018   10:14 AM  CBC EXTENDED  WBC 4.0 - 10.5 K/uL 6.8  7.7  6.6   RBC 4.22 - 5.81 MIL/uL 4.61  4.56  4.73   Hemoglobin 13.0 - 17.0 g/dL 14.9  14.4  15.0   HCT 39.0 - 52.0 % 43.0  42.7  41.7   Platelets 150 - 400 K/uL 230  258  271   NEUT# 1,500 - 7,800 cells/uL   4,323   Lymph# 850 - 3,900 cells/uL   1,346      There is no height or weight on file to calculate BMI.  Orders:  Orders Placed This Encounter  Procedures   XR Lumbar Spine 2-3 Views   MR Lumbar Spine w/o contrast   No orders of the defined types were placed in this encounter.    Procedures: No procedures performed  Clinical Data: No additional findings.  ROS:  All other systems negative, except as noted in the HPI. Review of Systems  Objective: Vital Signs:  There were no vitals taken for this visit.  Specialty Comments:  No specialty comments available.  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 ([place]) 10/09/2019   Benign prostatic hyperplasia 10/03/2019   Hypertension associated with diabetes ([place]) 10/03/2019   Type 2 diabetes mellitus without complication, without long-term current use of insulin ([place]) 10/03/2019   Personal history of colonic polyps 10/15/2018   Coccyodynia 01/23/2017   Pain in right wrist 01/23/2017   Benign neoplasm of colon 07/09/2014   Past Medical History:  Diagnosis Date   Arthritis    Diabetes mellitus without complication (HCC)    GERD (gastroesophageal reflux disease)    History of kidney stones 2021   Hyperlipidemia    Hypertension    T2DM (type 2 diabetes mellitus) ([place])     Family History  Problem Relation Age of Onset   Diabetes Mother    Cancer Brother     Past Surgical History:  Procedure Laterality Date   BACK SURGERY  1980's   lower   COLONOSCOPY N/A 07/09/2014   Procedure: COLONOSCOPY;  Surgeon: Lear Ng, MD;  Location: [facility];  Service: Endoscopy;  Laterality: N/A;   COLONOSCOPY WITH PROPOFOL N/A 10/15/2018   Procedure: COLONOSCOPY WITH PROPOFOL;  Surgeon: Wilford Corner, MD;  Location: [facility];  Service: Endoscopy;  Laterality: N/A;   HERNIA REPAIR  40 yrs ago   HOT HEMOSTASIS N/A 07/09/2014   Procedure: HOT HEMOSTASIS (ARGON PLASMA COAGULATION/BICAP);  Surgeon: Lear Ng, MD;  Location: [facility];  Service: Endoscopy;  Laterality: N/A;   MASS EXCISION Left 11/30/2022   Procedure: EXCISION OF LEFT BREAST MASS;  Surgeon: Stark Klein, MD;  Location: [facility];  Service: General;  Laterality: Left;    POLYPECTOMY  10/15/2018   Procedure: POLYPECTOMY;  Surgeon: Wilford Corner, MD;  Location: [facility];  Service: Endoscopy;;   REPLACEMENT TOTAL KNEE BILATERAL     TOTAL HIP ARTHROPLASTY Right 10/26/2020   Procedure: RIGHT TOTAL HIP ARTHROPLASTY ANTERIOR APPROACH;  Surgeon: Meredith Pel, MD;  Location: [facility];  Service: Orthopedics;  Laterality: Right;   TOTAL KNEE ARTHROPLASTY Bilateral    Social History   Occupational History   Not on file  Tobacco Use  Smoking status: Former   Smokeless tobacco: Current    Types: Snuff  Vaping Use   Vaping Use: Never used  Substance and Sexual Activity   Alcohol use: Yes    Comment: rare use   Drug use: No   Sexual activity: Yes

## 2022-12-07 ENCOUNTER — Other Ambulatory Visit: Payer: Self-pay | Admitting: Physician Assistant

## 2022-12-07 ENCOUNTER — Telehealth: Payer: Self-pay | Admitting: Physician Assistant

## 2022-12-07 MED ORDER — HYDROCODONE-ACETAMINOPHEN 5-325 MG PO TABS: 1.0000 | ORAL_TABLET | Freq: Four times a day (QID) | ORAL | 0 refills | Status: DC | PRN

## 2022-12-07 NOTE — Telephone Encounter (Signed)
Pt called requesting a call back from PA Persons. Pt states to have a question. Pt phone number is [phone]

## 2022-12-14 ENCOUNTER — Encounter: Payer: Self-pay | Admitting: Surgical

## 2022-12-14 ENCOUNTER — Ambulatory Visit: Payer: Self-pay

## 2022-12-14 ENCOUNTER — Ambulatory Visit (INDEPENDENT_AMBULATORY_CARE_PROVIDER_SITE_OTHER): Payer: Medicare PPO | Admitting: Surgical

## 2022-12-14 DIAGNOSIS — M5442 Lumbago with sciatica, left side: Secondary | ICD-10-CM | POA: Diagnosis not present

## 2022-12-14 DIAGNOSIS — G8929 Other chronic pain: Secondary | ICD-10-CM | POA: Diagnosis not present

## 2022-12-14 MED ORDER — PREDNISONE 5 MG (21) PO TBPK: ORAL_TABLET | ORAL | 0 refills | Status: DC

## 2022-12-14 NOTE — Progress Notes (Signed)
Follow-up Office Visit Note   Patient: Bobby Maxwell.           Date of Birth: 04-Aug-1946           MRN: [id number] Visit Date: 12/14/2022 Requested by: [place] [address],  [place] [address] PCP: [facility].  Subjective: Chief Complaint  Patient presents with   Lower Back - Pain    HPI: Bobby Maxwell. is a 76 y.o. male who returns to the office for follow-up visit.  Recently saw Haynes Dage persons PA-C on 12/05/2022  Plan at last visit was: X-rays demonstrate quite a bit of degenerative changes especially at L4-5 and L5-S1 with almost complete loss of joint space. I do have concern that he does have weakness in his left foot with a developing left foot drop. This paresthesia is localized to the fourth and fifth toes. Because of the new onset foot drop is causing him an antalgic gait. I have recommended a stat MRI and follow-up with Dr. Laurance Flatten for evaluation. Patient understands if he has any developing symptoms such as loss of bowel or bladder control or his leg giving out he is to call the emergency room or contact us immediately. He does have a history of diabetes   Since then, patient notes the weakness and radicular pain as well as his back pain has been steadily worsening.  He states he feels like he is dragging his left leg when he is ambulating.  Has to ambulate with a walker.  States his foot feels "weird".  Unable to lift his left foot up off of the floor.              ROS: All systems reviewed are negative as they relate to the chief complaint within the history of present illness.  Patient denies fevers or chills.  Assessment & Plan: Visit Diagnoses:  1. Chronic midline low back pain with left-sided sciatica     Plan: Bobby Maxwell. is a 76 y.o. male who returns to the office for follow-up visit for continued low back pain and radicular symptoms with weakness in the left foot. .  Plan from  last visit was noted above in HPI.  They now return with worsening pain and weakness.  He really cannot lift his left foot up off of the floor at all with 2/5 dorsiflexion strength on exam today.  Also has worsening back pain which has been keeping him up at night.  Dropfoot is making ambulation difficult and he has to ambulate with walker.  He is not having any saddle anesthesia or any incontinence of his bowels or bladder.  Does not really have any significant right-sided symptoms.  Plan is urgent MRI of the lumbar spine for further evaluation of compressive pathology that may be causing this weakness.  His weakness seems to be progressively worsening.  Discussed that this may be a surgical problem with Jeneen Rinks today and he understands.  Prescribed Medrol Dosepak for some symptomatic relief hopefully for the next several days as we get the imaging sorted out.  Recommended he obtain an MRI disc from [facility].  Likely anticipate he will follow-up with Dr. Laurance Flatten for review of MRI imaging and discussion of ESI versus surgical intervention.  Patient agreed with plan.  Will call him after MRI  Follow-Up Instructions: No follow-ups on file.   Orders:  No orders of the defined types were placed in this encounter.  Meds ordered this encounter  Medications   predniSONE (STERAPRED UNI-PAK 21 TAB) 5 MG (21) TBPK tablet    Sig: Take dosepak as directed    Dispense:  21 tablet    Refill:  0      Procedures: No procedures performed   Clinical Data: No additional findings.  Objective: Vital Signs: There were no vitals taken for this visit.  Physical Exam:  Constitutional: Patient appears well-developed HEENT:  Head: Normocephalic Eyes:EOM are normal Neck: Normal range of motion Cardiovascular: Normal rate Pulmonary/chest: Effort normal Neurologic: Patient is alert Skin: Skin is warm Psychiatric: Patient has normal mood and affect  Ortho Exam: Ortho exam demonstrates left leg with  intact hip flexion, quadricep, hamstring, plantarflexion strength.  He does have dropfoot with 2/5 motor strength of dorsiflexion compared with 5/5 dorsiflexion strength in the right lower extremity.  No clonus noted bilaterally.  He has sensation intact through all dermatomes except in the dorsum of the left foot relative to the right where he does have some numbness.  Tenderness throughout the axial lumbar spine.  No pain with hip range of motion.  Specialty Comments:  No specialty comments available.  Imaging: No results found.   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 ([place]) 10/09/2019   Benign prostatic hyperplasia 10/03/2019   Hypertension associated with diabetes ([place]) 10/03/2019   Type 2 diabetes mellitus without complication, without long-term current use of insulin ([place]) 10/03/2019   Personal history of colonic polyps 10/15/2018   Coccyodynia 01/23/2017   Pain in right wrist 01/23/2017   Benign neoplasm of colon 07/09/2014   Past Medical History:  Diagnosis Date   Arthritis    Diabetes mellitus without complication (HCC)    GERD (gastroesophageal reflux disease)    History of kidney stones 2021   Hyperlipidemia    Hypertension    T2DM (type 2 diabetes mellitus) ([place])     Family History  Problem Relation Age of Onset   Diabetes Mother    Cancer Brother     Past Surgical History:  Procedure Laterality Date   BACK SURGERY  1980's   lower   COLONOSCOPY N/A 07/09/2014   Procedure: COLONOSCOPY;  Surgeon: Lear Ng, MD;  Location: [facility];  Service: Endoscopy;  Laterality: N/A;   COLONOSCOPY WITH PROPOFOL N/A 10/15/2018   Procedure: COLONOSCOPY WITH PROPOFOL;  Surgeon: Wilford Corner, MD;  Location: [facility];   Service: Endoscopy;  Laterality: N/A;   HERNIA REPAIR  40 yrs ago   HOT HEMOSTASIS N/A 07/09/2014   Procedure: HOT HEMOSTASIS (ARGON PLASMA COAGULATION/BICAP);  Surgeon: Lear Ng, MD;  Location: [facility];  Service: Endoscopy;  Laterality: N/A;   MASS EXCISION Left 11/30/2022   Procedure: EXCISION OF LEFT BREAST MASS;  Surgeon: Stark Klein, MD;  Location: [facility];  Service: General;  Laterality: Left;   POLYPECTOMY  10/15/2018   Procedure: POLYPECTOMY;  Surgeon: Wilford Corner, MD;  [facility]: [facility];  Service: Endoscopy;;   REPLACEMENT TOTAL KNEE BILATERAL     TOTAL HIP ARTHROPLASTY Right 10/26/2020   Procedure: RIGHT TOTAL HIP ARTHROPLASTY ANTERIOR APPROACH;  Surgeon: Meredith Pel, MD;  Location: [facility];  Service: Orthopedics;  Laterality: Right;   TOTAL KNEE ARTHROPLASTY Bilateral    Social History   Occupational History  Not on file  Tobacco Use   Smoking status: Former   Smokeless tobacco: Current    Types: Snuff  Vaping Use   Vaping Use: Never used  Substance and Sexual Activity   Alcohol use: Yes    Comment: rare use   Drug use: No   Sexual activity: Yes

## 2022-12-15 NOTE — Telephone Encounter (Signed)
The patient has been scheduled at [facility] for 12/20/21 at 12:30. The patient is aware.

## 2022-12-25 ENCOUNTER — Other Ambulatory Visit: Payer: Self-pay | Admitting: Orthopedic Surgery

## 2022-12-25 ENCOUNTER — Telehealth: Payer: Self-pay | Admitting: Orthopedic Surgery

## 2022-12-25 ENCOUNTER — Other Ambulatory Visit: Payer: Self-pay

## 2022-12-25 DIAGNOSIS — G8929 Other chronic pain: Secondary | ICD-10-CM

## 2022-12-25 MED ORDER — TRAMADOL HCL 50 MG PO TABS: 50.0000 mg | ORAL_TABLET | Freq: Four times a day (QID) | ORAL | 0 refills | Status: DC | PRN

## 2022-12-25 NOTE — Telephone Encounter (Signed)
I called him.  He has significant spinal stenosis.  I do not think shots will help.  Please have him see Dr. Laurance Flatten and we will refill tramadol.  Thanks

## 2022-12-25 NOTE — Telephone Encounter (Signed)
Referral placed, Dr Marlou Sa sent meds

## 2022-12-25 NOTE — Telephone Encounter (Signed)
Patient called asked for a call back concerning the CD he left for Dr. Marlou Sa 12/20/2022. Patient called asked if Dr. Marlou Sa could call him with the results? The number to contact patient is [phone]

## 2023-01-03 ENCOUNTER — Ambulatory Visit (INDEPENDENT_AMBULATORY_CARE_PROVIDER_SITE_OTHER): Payer: Medicare PPO

## 2023-01-03 ENCOUNTER — Ambulatory Visit (INDEPENDENT_AMBULATORY_CARE_PROVIDER_SITE_OTHER): Payer: Medicare PPO | Admitting: Orthopedic Surgery

## 2023-01-03 ENCOUNTER — Encounter: Payer: Self-pay | Admitting: Orthopedic Surgery

## 2023-01-03 VITALS — BP 134/90 | HR 69 | Ht 68.0 in | Wt 192.0 lb

## 2023-01-03 DIAGNOSIS — G8929 Other chronic pain: Secondary | ICD-10-CM | POA: Diagnosis not present

## 2023-01-03 DIAGNOSIS — M5442 Lumbago with sciatica, left side: Secondary | ICD-10-CM

## 2023-01-03 NOTE — Progress Notes (Addendum)
Orthopedic Spine Surgery Office Note  Assessment: Patient is a 77 y.o. 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 Also has pain on the left side in L5 distribution. Has central stenosis at L3/4 and lateral recess stenosis at L4/5 that are consistent with his symptoms   Plan: -Explained that initially conservative treatment is tried to see what relief can be gained with these non-surgical treatment modalities. Discussed that the conservative treatments include:  -activity modification  -physical therapy  -over the counter pain medications  -steroid injections -Explained that some of his groin pain on the left side may be coming from the hip based my exam today -After discussing all the non-operative options, he wanted to continue with ibuprofen, tylenol, home exercises, and a steroid injection. Referral was provided to Dr. Ernestina Patches for the injection -Patient has tried activity modification, tramadol, tylenol, medrol dosepak -A1C would need to be 7.5 or less and he would need to be nicotine free if we ever decide surgery is a treatment option -Patient should return to office in 6 weeks, x-rays at next visit: none   Patient expressed understanding of the plan and all questions were answered to the patient's satisfaction.   ___________________________________________________________________________   History:  Patient is a 77 y.o. male who presents today for lumbar spine. Patient has had about 1 month of low back pain that radiates into his bilateral posterior hips. Feels it started after he was moving something heavy at work in mid December 2023. Had not had any significant back or leg pain preceding this time. Pain is now felt in his lower back and radiates into his bilateral buttock. He feels it if he stands too long or walks for more than a minute. It gets better if he sits down or leans forward. He also has left sided leg pain. It starts in the anterior thigh, goes over the  anterolateral leg and into the dorsal foot. No similar pain radiating past the buttock on the right side. Noted foot drop around the same time as symptom onsent. Said he could not lift his foot at all at that time. He feels his pain and weakness has gotten better with time. Does get paresthesias in the left leg in the same distribution as the pain. Has bilateral numbness and paresthesias in his toes. History of diabetes.    Weakness: yes, left foot. No other weakness noted.  Symptoms of imbalance: denies Paresthesias and numbness: yes, in bilateral toes and down the anterolateral aspect of the left leg. No other numbness or paresthesias Bowel or bladder incontinence: denies Saddle anesthesia: denies  Treatments tried: activity modification, tramadol, tylenol, medrol dosepak   Review of systems: Denies fevers and chills, night sweats, unexplained weight loss, history of cancer. Has had pain that wakes him at night  Past medical history: HLD HTN Diabetes (last A1C was 8.2 on 10/09/2022) GERD  Allergies: IV contrast  Past surgical history:  L5/S1 microdiscectomy Right THA Bilateral TKA Hernia repair Polypectomy  Social history: Reports use of nicotine product (smoking, vaping, patches, smokeless) - chew Alcohol use: denies Denies recreational drug use   Physical Exam:  General: no acute distress, appears stated age Neurologic: alert, answering questions appropriately, following commands Respiratory: unlabored breathing on room air, symmetric chest rise Psychiatric: appropriate affect, normal cadence to speech Vascular: palpable dorsalis pedis pulses bilaterally, feet warm and well perfused   MSK (spine):  -Strength exam      Left  Right EHL    3/5  5/5 TA  3/5  5/5 GSC    5/5  5/5 Knee extension  5/5  5/5 Hip flexion   5/5  5/5  -Sensory exam    Sensation intact to light touch in L3-S1 nerve distributions of bilateral lower extremities  -Achilles DTR: 1/4 on  the left, 1/4 on the right -Patellar tendon DTR: 1/4 on the left, 1/4 on the right  Left hip exam: positive FADIR, no pain through remainder of range of motion, positive stinchfield, negative FABER Right hip exam: no pain through range of motion, negative stinchfield, negative FABER  -Straight leg raise: negative bilaterally -Clonus: no beats bilaterally  Imaging: XR of the lumbar spine from 01/03/2023 was independently reviewed and interpreted, 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 Disc height loss at L5/S1. Loss of lordosis. PI of 62. LL of 35.   MRI of the lumbar spine from 12/20/2022 was independently reviewed and interpreted, showing central disc herniation at L3/4 with central and lateral recess stenosis. Lateral recess stenosis at L2/3 and L4/5. Right sided hemilaminotomy defect at L5/S1. T2 signal of 1.8479min the right L4/5 facet and 2.79m75mn the left.    Patient name: Bobby Loweatient MRN: [id number] of visit: 01/03/23

## 2023-01-08 ENCOUNTER — Ambulatory Visit: Payer: Self-pay

## 2023-01-08 ENCOUNTER — Telehealth: Payer: Self-pay | Admitting: Radiology

## 2023-01-08 ENCOUNTER — Encounter: Payer: Self-pay | Admitting: Physical Medicine and Rehabilitation

## 2023-01-08 ENCOUNTER — Ambulatory Visit (INDEPENDENT_AMBULATORY_CARE_PROVIDER_SITE_OTHER): Payer: Medicare PPO | Admitting: Physical Medicine and Rehabilitation

## 2023-01-08 VITALS — BP 57/39 | HR 116

## 2023-01-08 DIAGNOSIS — M48062 Spinal stenosis, lumbar region with neurogenic claudication: Secondary | ICD-10-CM | POA: Diagnosis not present

## 2023-01-08 DIAGNOSIS — R112 Nausea with vomiting, unspecified: Secondary | ICD-10-CM | POA: Diagnosis not present

## 2023-01-08 DIAGNOSIS — I9589 Other hypotension: Secondary | ICD-10-CM

## 2023-01-08 DIAGNOSIS — M5416 Radiculopathy, lumbar region: Secondary | ICD-10-CM | POA: Diagnosis not present

## 2023-01-08 DIAGNOSIS — E861 Hypovolemia: Secondary | ICD-10-CM

## 2023-01-08 MED ORDER — METHYLPREDNISOLONE ACETATE 80 MG/ML IJ SUSP
80.0000 mg | Freq: Once | INTRAMUSCULAR | Status: AC
  Administered 2023-01-08: 80 mg

## 2023-01-08 NOTE — Procedures (Signed)
No note

## 2023-01-08 NOTE — Telephone Encounter (Signed)
Patient came in to office for planned injection with Dr. Ernestina Patches this morning. He had been vomiting, was dizzy, and did not feel well. Blood pressure was recorded at 57/39 after multiple attempts to get reading. Dr. Ernestina Patches sat with patient and discussed going to ED with he and his driver. Per chart, patient has not been seen in the [facility] at ED.

## 2023-01-08 NOTE — Progress Notes (Signed)
Functional Pain Scale - descriptive words and definitions   Severe (9)  Cannot do any ADL's even with assistance can barely talk/unable to sleep and unable to use distraction. Severe range order  Average Pain 9   Patient states that symptoms have been really bad since 11/29/2022.  He has back pain. Toes are numb left>right. Having increased difficulty with ambulation. He has to use walker, cane, walls to get around. Patient did fall this morning prior to coming in for appointment. Scraped right elbow. Has been off blood thinner medication x 2 1/2 days.  +Driver, -BT, -Dye Allergies.

## 2023-01-08 NOTE — Patient Instructions (Signed)

## 2023-01-08 NOTE — Progress Notes (Signed)
Bobby Maxwell. - 77 y.o. male MRN [id number]  Date of birth: 1946-04-28  Office Visit Note: Visit Date: 01/08/2023 PCP: [facility]. Referred by: Callie Fielding, MD  Subjective: Chief Complaint  Patient presents with   Lower Back - Pain   HPI:  Bobby Maxwell. is a 77 y.o. male who comes in today at the request of Dr. Ileene Rubens for planned Left L3-4 Lumbar Transforaminal epidural steroid injection with fluoroscopic guidance.  The patient has failed conservative care including home exercise, medications, time and activity modification.  Unfortunately, the patient and his friend who is with him today reports that he had a significant episode of vomiting today on several occasions and had a fall at home.  She reports that he was somewhat sweaty and they felt like it was his blood sugar.  Patient is a diabetic.  He reports his blood sugars have been in the 140s lately any seemingly in control.  He reports yesterday doing fine without any dizziness spells.  He has had no chest pain or tightness.  He has had no real sign of infection although he says yesterday he did get to start to have some stuffiness in his nose.  No history of stroke he has had history of foot drop and history of hip replacement.  The person with him was concerned enough to want to take him to the emergency room but he was insisting on getting injection if he has had severe pain in his right hip and leg.  He is seeing Dr. Laurance Flatten who requested injection for severe stenosis.  Patient's never had injection before.   Review of Systems  Constitutional:  Positive for diaphoresis.  Eyes:  Positive for blurred vision.  Gastrointestinal:  Positive for nausea and vomiting.  Musculoskeletal:  Positive for back pain, falls and joint pain.  Neurological:  Positive for dizziness.  All other systems reviewed and are negative.  Otherwise per HPI.  Assessment & Plan: Visit Diagnoses:    ICD-10-CM    1. Lumbar radiculopathy  M54.16 Epidural Steroid injection    methylPREDNISolone acetate (DEPO-MEDROL) injection 80 mg    CANCELED: XR C-ARM NO REPORT    2. Spinal stenosis of lumbar region with neurogenic claudication  M48.062     3. Hypotension due to hypovolemia  I95.89    E86.1     4. Nausea and vomiting, unspecified vomiting type  R11.2       Plan: Findings:  1.  Episode this morning of several bouts of nausea and vomiting with some sweating without measured blood sugar at that time although history is tell me that he has had normal blood sugars lately.  He has had episode of a fall this morning with lightheaded and dizziness and blurred vision.  Blood pressure reading today on several occasions and repeated was 57/39.  Pulse taken manually was thready.  Upon standing he did have orthostatic intolerance.  Neurologic exam however was normal otherwise no focal deficit etc.  Patient was going to go to the emergency room with his caregiver and friend who was with him today.  2.  Low back pain right hip and leg pain consistent with neurogenic claudication and lumbar stenosis.  I did go over the injection at length with the patient today and I did tell him that depending on his situation with low blood pressure resolving I would see him back for the injection and work him in at that point.  Meds & Orders:  Meds ordered this encounter  Medications   methylPREDNISolone acetate (DEPO-MEDROL) injection 80 mg    Orders Placed This Encounter  Procedures   Epidural Steroid injection    Follow-up: Return for visit to requesting provider as needed.   Procedures: No procedures performed  (No note.)   Clinical History: MR imaging of the lumbar spine was obtained without contrast   COMPARISON: X-ray 01/08/2017   INDICATION: Chronic midline low back pain with left-sided sciatica   FINDINGS:   . The conus medullaris appears normal, terminating at the L1 level. No abnormal signal  demonstrated within the visualized distal spinal cord. Cauda equina unremarkable.   . Vertebral body heights maintained. Small osseous hemangiomas in the L1 and L5 vertebral bodies. Marrow signal within normal limits for age.   . Alignment: Mild to moderate scoliosis. Slight grade 1 anterolisthesis of L3 on L4.  . Modic 1 or 2 endplate changes:None  . Vertebral numbering:Standard   . The visualized lower thoracic spine demonstrates no disc herniation, significant central canal or foraminal stenosis.   . T12-L1: No disc herniation, significant central canal or foraminal stenosis.  Marland Kitchen L1-L2: No disc herniation, significant central canal or foraminal stenosis.  Marland Kitchen L2-L3: Mild to moderate asymmetric right-sided disc space narrowing. Moderate facet arthrosis and ligamentum flavum thickening. Bilateral facet joint effusions. Right foraminal disc osteophyte complex with a right-sided annular fissure. Moderate right foraminal and severe right lateral recess stenosis.  Marland Kitchen L3-L4: Moderate facet arthrosis and ligamentum flavum thickening. Slight degenerative grade 1 anterolisthesis. Broad-based disc protrusion. Severe central canal stenosis with compression/redundancy of traversing nerve roots of the cauda equina. Mild right foraminal stenosis.  Marland Kitchen L4-L5: Moderate facet arthrosis and ligamentum flavum thickening. Small facet joint effusions. Moderately severe asymmetric left-sided disc space narrowing. Broad-based disc osteophyte complex. Severe left lateral recess and mild to moderate bilateral foraminal and right lateral recess stenosis.  Marland Kitchen L5-S1: Severe disc space narrowing. Far lateral left foraminal osteophytes. Previous right hemilaminectomy and microdiscectomy. No spinal stenosis.   . Soft tissues are grossly unremarkable    IMPRESSION:  1. Severe multifactorial spinal stenosis at L3-4 with compression/redundancy of traversing nerve roots of the cauda equina.  2.  Moderate right foraminal and severe  right lateral recess stenosis at L2-3 contributed to by asymmetric right-sided disc space narrowing.  3.  Facet joint effusions from L2 through L5 suggesting a degree of facet joint synovitis or instability.  4.  Previous right hemilaminectomy and microdiscectomy at L5-S1. Far lateral left foraminal osteophyte may impinge on the exiting left L5 nerve root.  5.  Severe multifactorial left lateral recess stenosis at L4-5 with impingement on the traversing left L5 nerve root. There is also moderate left foraminal stenosis at this level, contributed to by asymmetric left-sided disc space narrowing.   Electronically Signed by: Bobby Clamp, MD on 12/21/2022     Objective:  VS:  HT:    WT:   BMI:     BP:(!) 57/39  HR:(!) 116bpm  TEMP: ( )  RESP:  Physical Exam Vitals and nursing note reviewed.  Constitutional:      General: He is not in acute distress.    Appearance: Normal appearance. He is ill-appearing. He is not diaphoretic.  HENT:     Head: Normocephalic and atraumatic.     Right Ear: External ear normal.     Left Ear: External ear normal.     Nose: No congestion.  Eyes:     Extraocular Movements: Extraocular movements  intact.  Cardiovascular:     Rate and Rhythm: Normal rate.     Comments: Radial pulses thready Pulmonary:     Effort: Pulmonary effort is normal. No respiratory distress.  Abdominal:     General: There is no distension.     Palpations: Abdomen is soft.     Tenderness: There is no abdominal tenderness.  Musculoskeletal:        General: No tenderness or signs of injury.     Cervical back: Neck supple.     Right lower leg: No edema.     Left lower leg: No edema.     Comments: Patient has good distal strength without clonus. Standing - able to stand but orthostatic.  Skin:    Findings: No erythema or rash.  Neurological:     General: No focal deficit present.     Mental Status: He is alert and oriented to person, place, and time.     Cranial Nerves: No  cranial nerve deficit.     Sensory: No sensory deficit.     Motor: No weakness or abnormal muscle tone.     Coordination: Coordination normal.     Gait: Gait abnormal.     Deep Tendon Reflexes: Reflexes normal.     Comments: Negaitive facial droop, negative pronator drift.  Psychiatric:        Mood and Affect: Mood normal.        Behavior: Behavior normal.      Imaging: No results found.

## 2023-01-09 ENCOUNTER — Ambulatory Visit (INDEPENDENT_AMBULATORY_CARE_PROVIDER_SITE_OTHER): Payer: Medicare PPO | Admitting: Physical Medicine and Rehabilitation

## 2023-01-09 ENCOUNTER — Ambulatory Visit: Payer: Self-pay

## 2023-01-09 ENCOUNTER — Telehealth: Payer: Self-pay | Admitting: Physical Medicine and Rehabilitation

## 2023-01-09 VITALS — BP 126/79 | HR 85

## 2023-01-09 DIAGNOSIS — M5416 Radiculopathy, lumbar region: Secondary | ICD-10-CM

## 2023-01-09 MED ORDER — METHYLPREDNISOLONE ACETATE 80 MG/ML IJ SUSP
80.0000 mg | Freq: Once | INTRAMUSCULAR | Status: AC
  Administered 2023-01-09: 80 mg

## 2023-01-09 NOTE — Telephone Encounter (Signed)
See other note. Patient called this morning.

## 2023-01-09 NOTE — Telephone Encounter (Signed)
Patient would like to know when he can get an appointment with Dr Ernestina Patches.[phone]

## 2023-01-09 NOTE — Telephone Encounter (Signed)
Spoke with patient and scheduled him for 01/09/23

## 2023-01-09 NOTE — Patient Instructions (Signed)

## 2023-01-09 NOTE — Telephone Encounter (Signed)
IC talked with patient at length. Advised need for him to be further evaluated for underlying condition by his PCP/UC/ER before proceeding with rescheduling injection.  He stated he is feeling much better today and his blood sugar is down to 137, was 285 yesterday morning.  He said he did not have any more vomiting after he was seen yesterday.  Stated that 2 times already this month he had similar episode where this "came on all of a sudden" Stated one of these times he was to see his PCP but was unable to because he felt so bad.  Stated he has not been exposed to anyone with a GI bug but feels this may have been what he had.  Stated that he really needs this injection and will not have a driver after 90/30.  Again expressed to patient need for getting further evaluation but patient insistent on injection. Asking if you or Dr Ernestina Patches could call to further discuss

## 2023-01-16 NOTE — Procedures (Signed)
Lumbosacral Transforaminal Epidural Steroid Injection - Sub-Pedicular Approach with Fluoroscopic Guidance  Patient: Bobby Maxwell.      Date of Birth: 1946-04-11 MRN: [id number] PCP: [facility].      Visit Date: 01/09/2023   Universal Protocol:    Date/Time: 01/09/2023  Consent Given By: the patient  Position: PRONE  Additional Comments: Vital signs were monitored before and after the procedure. Patient was prepped and draped in the usual sterile fashion. The correct patient, procedure, and site was verified.   Injection Procedure Details:   Procedure diagnoses: Lumbar radiculopathy [M54.16]    Meds Administered:  Meds ordered this encounter  Medications   methylPREDNISolone acetate (DEPO-MEDROL) injection 80 mg    Laterality: Left  Location/Site: L3  Needle:5.0 in., 22 ga.  Short bevel or Quincke spinal needle  Needle Placement: Transforaminal  Findings:    -Comments: Excellent flow of contrast along the nerve, nerve root and into the epidural space.  Procedure Details: After squaring off the end-plates to get a true AP view, the C-arm was positioned so that an oblique view of the foramen as noted above was visualized. The target area is just inferior to the "nose of the scotty dog" or sub pedicular. The soft tissues overlying this structure were infiltrated with 2-3 ml. of 1% Lidocaine without Epinephrine.  The spinal needle was inserted toward the target using a "trajectory" view along the fluoroscope beam.  Under AP and lateral visualization, the needle was advanced so it did not puncture dura and was located close the 6 O'Clock position of the pedical in AP tracterory. Biplanar projections were used to confirm position. Aspiration was confirmed to be negative for CSF and/or blood. A 1 mL volume of gadolinium type contrast was injected and flow of contrast was noted at each level. Radiographs were obtained for documentation purposes.    After attaining the desired flow of contrast documented above, a 0.5 to 1.0 ml test dose of 0.25% Marcaine was injected into each respective transforaminal space.  The patient was observed for 90 seconds post injection.  After no sensory deficits were reported, and normal lower extremity motor function was noted,   the above injectate was administered so that equal amounts of the injectate were placed at each foramen (level) into the transforaminal epidural space.   Additional Comments:  No complications occurred Dressing: 2 x 2 sterile gauze and Band-Aid    Post-procedure details: Patient was observed during the procedure. Post-procedure instructions were reviewed.  Patient left the clinic in stable condition.

## 2023-01-16 NOTE — Progress Notes (Signed)
Functional Pain Scale - descriptive words and definitions   Severe (9)  Cannot do any ADL's even with assistance can barely talk/unable to sleep and unable to use distraction. Severe range order  Average Pain 9   +Driver, -BT, +Dye Allergies.

## 2023-01-16 NOTE — Progress Notes (Signed)
Bobby Maxwell. - 77 y.o. male MRN [id number]  Date of birth: 1946-08-25  Office Visit Note: Visit Date: 01/09/2023 PCP: [facility]. Referred by: Callie Fielding, MD  Subjective: Chief Complaint  Patient presents with   Lower Back - Pain   Left Leg - Pain   HPI:  Bobby Trickel. is a 77 y.o. male who comes in today at the request of Dr. Ileene Rubens for planned Left L3-4 Lumbar Transforaminal epidural steroid injection with fluoroscopic guidance.  The patient has failed conservative care including home exercise, medications, time and activity modification.  This injection will be diagnostic and hopefully therapeutic.  Please see requesting physician notes for further details and justification.   ROS Otherwise per HPI.  Assessment & Plan: Visit Diagnoses:    ICD-10-CM   1. Lumbar radiculopathy  M54.16 XR C-ARM NO REPORT    Epidural Steroid injection    methylPREDNISolone acetate (DEPO-MEDROL) injection 80 mg      Plan: No additional findings.   Meds & Orders:  Meds ordered this encounter  Medications   methylPREDNISolone acetate (DEPO-MEDROL) injection 80 mg    Orders Placed This Encounter  Procedures   XR C-ARM NO REPORT   Epidural Steroid injection    Follow-up: Return for visit to requesting provider as needed.   Procedures: No procedures performed  Lumbosacral Transforaminal Epidural Steroid Injection - Sub-Pedicular Approach with Fluoroscopic Guidance  Patient: Bobby Maxwell.      Date of Birth: June 24, 1946 MRN: [id number] PCP: [facility].      Visit Date: 01/09/2023   Universal Protocol:    Date/Time: 01/09/2023  Consent Given By: the patient  Position: PRONE  Additional Comments: Vital signs were monitored before and after the procedure. Patient was prepped and draped in the usual sterile fashion. The correct patient, procedure, and site was verified.   Injection Procedure Details:    Procedure diagnoses: Lumbar radiculopathy [M54.16]    Meds Administered:  Meds ordered this encounter  Medications   methylPREDNISolone acetate (DEPO-MEDROL) injection 80 mg    Laterality: Left  Location/Site: L3  Needle:5.0 in., 22 ga.  Short bevel or Quincke spinal needle  Needle Placement: Transforaminal  Findings:    -Comments: Excellent flow of contrast along the nerve, nerve root and into the epidural space.  Procedure Details: After squaring off the end-plates to get a true AP view, the C-arm was positioned so that an oblique view of the foramen as noted above was visualized. The target area is just inferior to the "nose of the scotty dog" or sub pedicular. The soft tissues overlying this structure were infiltrated with 2-3 ml. of 1% Lidocaine without Epinephrine.  The spinal needle was inserted toward the target using a "trajectory" view along the fluoroscope beam.  Under AP and lateral visualization, the needle was advanced so it did not puncture dura and was located close the 6 O'Clock position of the pedical in AP tracterory. Biplanar projections were used to confirm position. Aspiration was confirmed to be negative for CSF and/or blood. A 1 mL volume of gadolinium type contrast was injected and flow of contrast was noted at each level. Radiographs were obtained for documentation purposes.   After attaining the desired flow of contrast documented above, a 0.5 to 1.0 ml test dose of 0.25% Marcaine was injected into each respective transforaminal space.  The patient was observed for 90 seconds post injection.  After no sensory deficits were reported, and normal  lower extremity motor function was noted,   the above injectate was administered so that equal amounts of the injectate were placed at each foramen (level) into the transforaminal epidural space.   Additional Comments:  No complications occurred Dressing: 2 x 2 sterile gauze and Band-Aid    Post-procedure  details: Patient was observed during the procedure. Post-procedure instructions were reviewed.  Patient left the clinic in stable condition.    Clinical History: MR imaging of the lumbar spine was obtained without contrast   COMPARISON: X-ray 01/08/2017   INDICATION: Chronic midline low back pain with left-sided sciatica   FINDINGS:   . The conus medullaris appears normal, terminating at the L1 level. No abnormal signal demonstrated within the visualized distal spinal cord. Cauda equina unremarkable.   . Vertebral body heights maintained. Small osseous hemangiomas in the L1 and L5 vertebral bodies. Marrow signal within normal limits for age.   . Alignment: Mild to moderate scoliosis. Slight grade 1 anterolisthesis of L3 on L4.  . Modic 1 or 2 endplate changes:None  . Vertebral numbering:Standard   . The visualized lower thoracic spine demonstrates no disc herniation, significant central canal or foraminal stenosis.   . T12-L1: No disc herniation, significant central canal or foraminal stenosis.  Marland Kitchen L1-L2: No disc herniation, significant central canal or foraminal stenosis.  Marland Kitchen L2-L3: Mild to moderate asymmetric right-sided disc space narrowing. Moderate facet arthrosis and ligamentum flavum thickening. Bilateral facet joint effusions. Right foraminal disc osteophyte complex with a right-sided annular fissure. Moderate right foraminal and severe right lateral recess stenosis.  Marland Kitchen L3-L4: Moderate facet arthrosis and ligamentum flavum thickening. Slight degenerative grade 1 anterolisthesis. Broad-based disc protrusion. Severe central canal stenosis with compression/redundancy of traversing nerve roots of the cauda equina. Mild right foraminal stenosis.  Marland Kitchen L4-L5: Moderate facet arthrosis and ligamentum flavum thickening. Small facet joint effusions. Moderately severe asymmetric left-sided disc space narrowing. Broad-based disc osteophyte complex. Severe left lateral recess and mild to moderate  bilateral foraminal and right lateral recess stenosis.  Marland Kitchen L5-S1: Severe disc space narrowing. Far lateral left foraminal osteophytes. Previous right hemilaminectomy and microdiscectomy. No spinal stenosis.   . Soft tissues are grossly unremarkable    IMPRESSION:  1. Severe multifactorial spinal stenosis at L3-4 with compression/redundancy of traversing nerve roots of the cauda equina.  2.  Moderate right foraminal and severe right lateral recess stenosis at L2-3 contributed to by asymmetric right-sided disc space narrowing.  3.  Facet joint effusions from L2 through L5 suggesting a degree of facet joint synovitis or instability.  4.  Previous right hemilaminectomy and microdiscectomy at L5-S1. Far lateral left foraminal osteophyte may impinge on the exiting left L5 nerve root.  5.  Severe multifactorial left lateral recess stenosis at L4-5 with impingement on the traversing left L5 nerve root. There is also moderate left foraminal stenosis at this level, contributed to by asymmetric left-sided disc space narrowing.   Electronically Signed by: Carrington Clamp, MD on 12/21/2022     Objective:  VS:  HT:    WT:   BMI:     BP:126/79  HR:85bpm  TEMP: ( )  RESP:  Physical Exam Vitals and nursing note reviewed.  Constitutional:      General: He is not in acute distress.    Appearance: Normal appearance. He is not ill-appearing.  HENT:     Head: Normocephalic and atraumatic.     Right Ear: External ear normal.     Left Ear: External ear normal.  Nose: No congestion.  Eyes:     Extraocular Movements: Extraocular movements intact.  Cardiovascular:     Rate and Rhythm: Normal rate.     Pulses: Normal pulses.  Pulmonary:     Effort: Pulmonary effort is normal. No respiratory distress.  Abdominal:     General: There is no distension.     Palpations: Abdomen is soft.  Musculoskeletal:        General: No tenderness or signs of injury.     Cervical back: Neck supple.     Right lower  leg: No edema.     Left lower leg: No edema.     Comments: Patient has good distal strength without clonus.  Skin:    Findings: No erythema or rash.  Neurological:     General: No focal deficit present.     Mental Status: He is alert and oriented to person, place, and time.     Sensory: No sensory deficit.     Motor: No weakness or abnormal muscle tone.     Coordination: Coordination normal.  Psychiatric:        Mood and Affect: Mood normal.        Behavior: Behavior normal.      Imaging: No results found.

## 2023-02-14 ENCOUNTER — Ambulatory Visit (INDEPENDENT_AMBULATORY_CARE_PROVIDER_SITE_OTHER): Payer: Medicare PPO | Admitting: Orthopedic Surgery

## 2023-02-14 DIAGNOSIS — M48062 Spinal stenosis, lumbar region with neurogenic claudication: Secondary | ICD-10-CM | POA: Diagnosis not present

## 2023-02-14 NOTE — Progress Notes (Signed)
Orthopedic Spine Surgery Office Note  Assessment: Patient is a 77 y.o. 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 Also has pain on the left side in L5 distribution. Has central stenosis at L3/4 and lateral recess stenosis at L4/5 that are consistent with his symptoms    Plan: -Explained that initially conservative treatment is tried as a significant number of patients may experience relief with these treatment modalities. Discussed that the conservative treatments include:  -activity modification  -physical therapy  -over the counter pain medications  -medrol dosepak  -lumbar steroid injections -Patient has tried activity modification, tramadol, Tylenol, Medrol Dosepak, steroid injection, CBD -Recommended he continue with the CBD and that he can do periodic injections up to every 3 months if his pain returns -A1C would need to be 7.5 or less and he would need to be nicotine free if we ever decide surgery is a treatment option  -I told him that if his pain returns, he should call the office and I will see him again   Patient expressed understanding of the plan and all questions were answered to the patient's satisfaction.   ___________________________________________________________________________  History: Patient is a 77 y.o. male who has been previously seen in the office for symptoms of low back pain that radiates into his bilateral posterior hips and buttock.  He was feeling if he stands too long or walks for more than a minute.  It would get better if he sat down or lean forward.  At her last visit, we talked about a steroid injection as a potential diagnostic and therapeutic treatment for him.  He got that injection and feels that he got about 80% relief and his pain is much more tolerable now.  He has also started taking CBD Gummies and feels that those are helpful as well.  His pain is at a point that it is tolerable.  He is not interested in surgery and wants to continue  with the injections and CBD as this is providing him with adequate relief.  Previous treatments: activity modification, tramadol, Tylenol, Medrol Dosepak, steroid injection, CBD  Physical Exam:  General: no acute distress, appears stated age Neurologic: alert, answering questions appropriately, following commands Respiratory: unlabored breathing on room air, symmetric chest rise Psychiatric: appropriate affect, normal cadence to speech   MSK (spine):   -Strength exam                                                   Left                  Right EHL                              3/5                  5/5 TA                                 3/5                  5/5 GSC                             5/5  5/5 Knee extension            5/5                  5/5 Hip flexion                    5/5                  5/5   -Sensory exam                           Sensation intact to light touch in L3-S1 nerve distributions of bilateral lower extremities   -Achilles DTR: 1/4 on the left, 1/4 on the right -Patellar tendon DTR: 1/4 on the left, 1/4 on the right   Left hip exam: negative FADIR, no pain through range of motion, negative stinchfield, negative FABER Right hip exam: no pain through range of motion, negative stinchfield, negative FABER   -Straight leg raise: negative bilaterally -Clonus: no beats bilaterally   Imaging: XR of the lumbar spine from 01/03/2023 was previously independently reviewed and interpreted, showing 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 Disc height loss at L5/S1. Loss of lordosis. PI of 62. LL of 35.    MRI of the lumbar spine from 12/20/2022 was previously independently reviewed and interpreted, showing central disc herniation at L3/4 with central and lateral recess stenosis. Lateral recess stenosis at L2/3 and L4/5. Right sided hemilaminotomy defect at L5/S1. T2 signal of 1.874min the right L4/5  facet and 2.72m2mn the left.    Patient name: JamJamyrion Venturoatient MRN: [id number] of visit: 02/14/23

## 2024-01-09 ENCOUNTER — Encounter: Payer: Self-pay | Admitting: Orthopedic Surgery

## 2024-01-09 ENCOUNTER — Ambulatory Visit: Payer: Medicare HMO | Admitting: Orthopedic Surgery

## 2024-01-09 ENCOUNTER — Other Ambulatory Visit (INDEPENDENT_AMBULATORY_CARE_PROVIDER_SITE_OTHER): Payer: Medicare HMO

## 2024-01-09 DIAGNOSIS — M25512 Pain in left shoulder: Secondary | ICD-10-CM | POA: Diagnosis not present

## 2024-01-09 DIAGNOSIS — G8929 Other chronic pain: Secondary | ICD-10-CM

## 2024-01-09 NOTE — Progress Notes (Signed)
Office Visit Note   Patient: Bobby Maxwell.           Date of Birth: [birth year]           MRN: [id number] Visit Date: 01/09/2024 Requested by: No referring provider defined for this encounter. PCP: No primary care provider on file.  Subjective: Chief Complaint  Patient presents with   Left Shoulder - Pain    HPI: Bobby Maxwell. is a 78 y.o. male who presents to the office reporting left shoulder pain.  He has a known history of left shoulder arthritis.  Gabapentin helps.  Mobic also helps but he does not take it every day.  Localizes the pain to the deltoid region.  Does not report any radicular symptoms.  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  Hard for him to lift a cup to drink as well because of his symptoms.  Did have "bone spur" surgery 25 years ago in the left shoulder.  Patient cannot take oxycodone after surgery because of constipation issues.  He is able to take tramadol.  He has done well with his knee replacements and hip replacement..                ROS: All systems reviewed are negative as they relate to the chief complaint within the history of present illness.  Patient denies fevers or chills.  Assessment & Plan: Visit Diagnoses:  1. Chronic left shoulder pain     Plan: Impression is left shoulder end-stage arthritis with restriction of motion and pain.  Plan is thin cut CT scan of the left shoulder for preop reverse shoulder replacement.  The patient has tried to do stretching on his own along with activity modification and has taken medication none of which have given him sustained relief.  This has been ongoing for many years and he has failed a course of conservative treatment for least 6 weeks.  Patient understands risk and benefits of surgery including not limited to infection nerve and vessel damage incomplete pain relief as well as incomplete restitution of  function.  Patient wishes to proceed.  All questions answered.  Thin cut CT scan for patient specific instrumentation to optimize implant position and longevity.  Anticipate surgery within 5 to 6 weeks.  Follow-Up Instructions: No follow-ups on file.   Orders:  Orders Placed This Encounter  Procedures   XR Shoulder Left   CT SHOULDER LEFT WO CONTRAST   No orders of the defined types were placed in this encounter.     Procedures: No procedures performed   Clinical Data: No additional findings.  Objective: Vital Signs: There were no vitals taken for this visit.  Physical Exam:  Constitutional: Patient appears well-developed HEENT:  Head: Normocephalic Eyes:EOM are normal Neck: Normal range of motion Cardiovascular: Normal rate Pulmonary/chest: Effort normal Neurologic: Patient is alert Skin: Skin is warm Psychiatric: Patient has normal mood and affect  Ortho Exam: Ortho exam demonstrates range of motion on that left of 10/80/95.  Deltoid is functional.  Motor or sensory function to the hand is intact.  Skin is intact in that left shoulder region.  Cervical spine range of motion intact with no paresthesias C5-T1.  Specialty Comments:  MR imaging of the lumbar spine was obtained without contrast   COMPARISON: X-ray 01/08/2017   INDICATION: Chronic midline low back pain with left-sided sciatica   FINDINGS:   .  The conus medullaris appears normal, terminating at the L1 level. No abnormal signal demonstrated within the visualized distal spinal cord. Cauda equina unremarkable.   . Vertebral body heights maintained. Small osseous hemangiomas in the L1 and L5 vertebral bodies. Marrow signal within normal limits for age.   . Alignment: Mild to moderate scoliosis. Slight grade 1 anterolisthesis of L3 on L4.  . Modic 1 or 2 endplate changes:None  . Vertebral numbering:Standard   . The visualized lower thoracic spine demonstrates no disc herniation, significant central canal or  foraminal stenosis.   . T12-L1: No disc herniation, significant central canal or foraminal stenosis.  Marland Kitchen L1-L2: No disc herniation, significant central canal or foraminal stenosis.  Marland Kitchen L2-L3: Mild to moderate asymmetric right-sided disc space narrowing. Moderate facet arthrosis and ligamentum flavum thickening. Bilateral facet joint effusions. Right foraminal disc osteophyte complex with a right-sided annular fissure. Moderate right foraminal and severe right lateral recess stenosis.  Marland Kitchen L3-L4: Moderate facet arthrosis and ligamentum flavum thickening. Slight degenerative grade 1 anterolisthesis. Broad-based disc protrusion. Severe central canal stenosis with compression/redundancy of traversing nerve roots of the cauda equina. Mild right foraminal stenosis.  Marland Kitchen L4-L5: Moderate facet arthrosis and ligamentum flavum thickening. Small facet joint effusions. Moderately severe asymmetric left-sided disc space narrowing. Broad-based disc osteophyte complex. Severe left lateral recess and mild to moderate bilateral foraminal and right lateral recess stenosis.  Marland Kitchen L5-S1: Severe disc space narrowing. Far lateral left foraminal osteophytes. Previous right hemilaminectomy and microdiscectomy. No spinal stenosis.   . Soft tissues are grossly unremarkable    IMPRESSION:  1. Severe multifactorial spinal stenosis at L3-4 with compression/redundancy of traversing nerve roots of the cauda equina.  2.  Moderate right foraminal and severe right lateral recess stenosis at L2-3 contributed to by asymmetric right-sided disc space narrowing.  3.  Facet joint effusions from L2 through L5 suggesting a degree of facet joint synovitis or instability.  4.  Previous right hemilaminectomy and microdiscectomy at L5-S1. Far lateral left foraminal osteophyte may impinge on the exiting left L5 nerve root.  5.  Severe multifactorial left lateral recess stenosis at L4-5 with impingement on the traversing left L5 nerve root. There is also  moderate left foraminal stenosis at this level, contributed to by asymmetric left-sided disc space narrowing.   Electronically Signed by: Jeanne Ivan, MD on 12/21/2022  Imaging: No results found.   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) 10/09/2019   Benign prostatic hyperplasia 10/03/2019   Hypertension associated with diabetes (HCC) 10/03/2019   Type 2 diabetes mellitus without complication, without long-term current use of insulin (HCC) 10/03/2019   History of colonic polyps 10/15/2018   Coccyodynia 01/23/2017   Pain in right wrist 01/23/2017   Benign neoplasm of colon 07/09/2014   Past Medical History:  Diagnosis Date   Arthritis    Diabetes mellitus without complication (HCC)    GERD (gastroesophageal reflux disease)    History of kidney stones 2021   Hyperlipidemia    Hypertension    T2DM (type 2 diabetes mellitus) (HCC)     Family History  Problem Relation Age of Onset   Diabetes Mother    Cancer Brother     Past Surgical History:  Procedure Laterality Date   BACK SURGERY  1980's   lower  COLONOSCOPY N/A 07/09/2014   Procedure: COLONOSCOPY;  Surgeon: Shirley Friar, MD;  Location: [facility];  Service: Endoscopy;  Laterality: N/A;   COLONOSCOPY WITH PROPOFOL N/A 10/15/2018   Procedure: COLONOSCOPY WITH PROPOFOL;  Surgeon: Charlott Rakes, MD;  Location: [facility];  Service: Endoscopy;  Laterality: N/A;   HERNIA REPAIR  40 yrs ago   HOT HEMOSTASIS N/A 07/09/2014   Procedure: HOT HEMOSTASIS (ARGON PLASMA COAGULATION/BICAP);  Surgeon: Shirley Friar, MD;  Location: [facility];  Service: Endoscopy;  Laterality: N/A;   MASS EXCISION Left 11/30/2022   Procedure: EXCISION OF LEFT BREAST MASS;  Surgeon: Almond Lint, MD;  Location: [facility];  Service: General;  Laterality: Left;   POLYPECTOMY  10/15/2018   Procedure: POLYPECTOMY;  Surgeon: Charlott Rakes, MD;  Location: [facility];  Service: Endoscopy;;   REPLACEMENT TOTAL KNEE BILATERAL     TOTAL HIP ARTHROPLASTY Right 10/26/2020   Procedure: RIGHT TOTAL HIP ARTHROPLASTY ANTERIOR APPROACH;  Surgeon: Cammy Copa, MD;  Location: [facility];  Service: Orthopedics;  Laterality: Right;   TOTAL KNEE ARTHROPLASTY Bilateral    Social History   Occupational History   Not on file  Tobacco Use   Smoking status: Former   Smokeless tobacco: Current    Types: Snuff  Vaping Use   Vaping status: Never Used  Substance and Sexual Activity   Alcohol use: Yes    Comment: rare use   Drug use: No   Sexual activity: Yes

## 2024-01-14 ENCOUNTER — Ambulatory Visit
Admission: RE | Admit: 2024-01-14 | Discharge: 2024-01-14 | Disposition: A | Discharge status: Home / Self Care | Payer: Medicare HMO | Source: Ambulatory Visit | Attending: Orthopedic Surgery | Admitting: Orthopedic Surgery

## 2024-01-14 DIAGNOSIS — G8929 Other chronic pain: Secondary | ICD-10-CM

## 2024-01-23 ENCOUNTER — Telehealth: Payer: Self-pay | Admitting: Orthopedic Surgery

## 2024-01-23 ENCOUNTER — Other Ambulatory Visit: Payer: Self-pay | Admitting: Orthopedic Surgery

## 2024-01-23 MED ORDER — TRAMADOL HCL 50 MG PO TABS: 50.0000 mg | ORAL_TABLET | Freq: Four times a day (QID) | ORAL | 1 refills | Status: DC | PRN

## 2024-01-23 NOTE — Telephone Encounter (Signed)
Sent pls clala thx

## 2024-01-23 NOTE — Telephone Encounter (Signed)
 Patient called advised his shoulder is really hurting. Patient asked if he can get a Rx for Tramadol  sent to his pharmacy.  [facility] on [address]. The number to contact is [phone]

## 2024-01-24 NOTE — Telephone Encounter (Signed)
 Patient aware.

## 2024-02-06 ENCOUNTER — Ambulatory Visit (INDEPENDENT_AMBULATORY_CARE_PROVIDER_SITE_OTHER): Payer: Medicare HMO | Admitting: Orthopedic Surgery

## 2024-02-06 DIAGNOSIS — M19012 Primary osteoarthritis, left shoulder: Secondary | ICD-10-CM | POA: Diagnosis not present

## 2024-02-06 NOTE — Progress Notes (Unsigned)
Office Visit Note   Patient: Bobby Maxwell.           Date of Birth: 1946/04/09           MRN: [id number] Visit Date: 02/06/2024 Requested by: No referring provider defined for this encounter. PCP: Pcp, No  Subjective: Chief Complaint  Patient presents with   Left Shoulder - Follow-up    Review scan    HPI: Bobby Maxwell. is a 78 y.o. male who presents to the office reporting left shoulder pain.  Since he was last seen has had a CT scan which is reviewed.  CT scan does show severe arthritis of the glenohumeral joint with mild arthropathy of the [facility] joint.  Glenoid vault dimensions are adequate for reverse shoulder replacement.  Is using tramadol episodically for pain.  He does have a friend at home..                ROS: All systems reviewed are negative as they relate to the chief complaint within the history of present illness.  Patient denies fevers or chills.  Assessment & Plan: Visit Diagnoses:  1. Arthritis of left shoulder region     Plan: Impression is end-stage left shoulder arthritis which is symptomatic.  Has diminished range of motion and diminished functional ADLs.  Pain at night.  Risk and benefits of reverse shoulder replacement are discussed with the patient including not limited to infection nerve vessel damage incomplete pain relief as well as incomplete restoration of function.  The expected hospital course and rehabilitation course was also described.  Patient understands the risk and benefits and wishes to proceed.  All questions answered.  Follow-Up Instructions: No follow-ups on file.   Orders:  No orders of the defined types were placed in this encounter.  No orders of the defined types were placed in this encounter.     Procedures: No procedures performed   Clinical Data: No additional findings.  Objective: Vital Signs: There were no vitals taken for this visit.  Physical Exam:  Constitutional: Patient appears  well-developed HEENT:  Head: Normocephalic Eyes:EOM are normal Neck: Normal range of motion Cardiovascular: Normal rate Pulmonary/chest: Effort normal Neurologic: Patient is alert Skin: Skin is warm Psychiatric: Patient has normal mood and affect  Ortho Exam: Ortho exam demonstrates functional deltoid.  Radial pulse intact.  No masses lymphadenopathy or skin changes noted in the shoulder region.  Shoulder range of motion is 10/80/95.  Motor or sensory function of the left hand is intact.  Cervical spine range of motion intact with no paresthesias C5-T1.  Specialty Comments:  MR imaging of the lumbar spine was obtained without contrast   COMPARISON: X-ray 01/08/2017   INDICATION: Chronic midline low back pain with left-sided sciatica   FINDINGS:   . The conus medullaris appears normal, terminating at the L1 level. No abnormal signal demonstrated within the visualized distal spinal cord. Cauda equina unremarkable.   . Vertebral body heights maintained. Small osseous hemangiomas in the L1 and L5 vertebral bodies. Marrow signal within normal limits for age.   . Alignment: Mild to moderate scoliosis. Slight grade 1 anterolisthesis of L3 on L4.  . Modic 1 or 2 endplate changes:None  . Vertebral numbering:Standard   . The visualized lower thoracic spine demonstrates no disc herniation, significant central canal or foraminal stenosis.   . T12-L1: No disc herniation, significant central canal or foraminal stenosis.  Marland Kitchen L1-L2: No disc herniation, significant central canal or foraminal stenosis.  Marland Kitchen  L2-L3: Mild to moderate asymmetric right-sided disc space narrowing. Moderate facet arthrosis and ligamentum flavum thickening. Bilateral facet joint effusions. Right foraminal disc osteophyte complex with a right-sided annular fissure. Moderate right foraminal and severe right lateral recess stenosis.  Marland Kitchen L3-L4: Moderate facet arthrosis and ligamentum flavum thickening. Slight degenerative grade 1  anterolisthesis. Broad-based disc protrusion. Severe central canal stenosis with compression/redundancy of traversing nerve roots of the cauda equina. Mild right foraminal stenosis.  Marland Kitchen L4-L5: Moderate facet arthrosis and ligamentum flavum thickening. Small facet joint effusions. Moderately severe asymmetric left-sided disc space narrowing. Broad-based disc osteophyte complex. Severe left lateral recess and mild to moderate bilateral foraminal and right lateral recess stenosis.  Marland Kitchen L5-S1: Severe disc space narrowing. Far lateral left foraminal osteophytes. Previous right hemilaminectomy and microdiscectomy. No spinal stenosis.   . Soft tissues are grossly unremarkable    IMPRESSION:  1. Severe multifactorial spinal stenosis at L3-4 with compression/redundancy of traversing nerve roots of the cauda equina.  2.  Moderate right foraminal and severe right lateral recess stenosis at L2-3 contributed to by asymmetric right-sided disc space narrowing.  3.  Facet joint effusions from L2 through L5 suggesting a degree of facet joint synovitis or instability.  4.  Previous right hemilaminectomy and microdiscectomy at L5-S1. Far lateral left foraminal osteophyte may impinge on the exiting left L5 nerve root.  5.  Severe multifactorial left lateral recess stenosis at L4-5 with impingement on the traversing left L5 nerve root. There is also moderate left foraminal stenosis at this level, contributed to by asymmetric left-sided disc space narrowing.   Electronically Signed by: Jeanne Ivan, MD on 12/21/2022  Imaging: No results found.   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) 10/09/2019   Benign prostatic hyperplasia 10/03/2019   Hypertension associated with diabetes  (HCC) 10/03/2019   Type 2 diabetes mellitus without complication, without long-term current use of insulin (HCC) 10/03/2019   History of colonic polyps 10/15/2018   Coccyodynia 01/23/2017   Pain in right wrist 01/23/2017   Benign neoplasm of colon 07/09/2014   Past Medical History:  Diagnosis Date   Arthritis    Diabetes mellitus without complication (HCC)    GERD (gastroesophageal reflux disease)    History of kidney stones 2021   Hyperlipidemia    Hypertension    T2DM (type 2 diabetes mellitus) (HCC)     Family History  Problem Relation Age of Onset   Diabetes Mother    Cancer Brother     Past Surgical History:  Procedure Laterality Date   BACK SURGERY  1980's   lower   COLONOSCOPY N/A 07/09/2014   Procedure: COLONOSCOPY;  Surgeon: Shirley Friar, MD;  Location: [facility];  Service: Endoscopy;  Laterality: N/A;   COLONOSCOPY WITH PROPOFOL N/A 10/15/2018   Procedure: COLONOSCOPY WITH PROPOFOL;  Surgeon: Charlott Rakes, MD;  Location: [facility];  Service: Endoscopy;  Laterality: N/A;   HERNIA REPAIR  40 yrs ago   HOT HEMOSTASIS N/A 07/09/2014   Procedure: HOT HEMOSTASIS (ARGON PLASMA COAGULATION/BICAP);  Surgeon: Shirley Friar, MD;  Location: [facility];  Service: Endoscopy;  Laterality: N/A;   MASS EXCISION Left 11/30/2022   Procedure: EXCISION OF LEFT BREAST MASS;  Surgeon: Almond Lint, MD;  Location: [facility];  Service: General;  Laterality:  Left;   POLYPECTOMY  10/15/2018   Procedure: POLYPECTOMY;  Surgeon: Charlott Rakes, MD;  Location: [facility];  Service: Endoscopy;;   REPLACEMENT TOTAL KNEE BILATERAL     TOTAL HIP ARTHROPLASTY Right 10/26/2020   Procedure: RIGHT TOTAL HIP ARTHROPLASTY ANTERIOR APPROACH;  Surgeon: Cammy Copa, MD;  Location: [facility];  Service: Orthopedics;  Laterality: Right;   TOTAL KNEE ARTHROPLASTY Bilateral    Social History   Occupational History   Not on file  Tobacco Use   Smoking status:  Former   Smokeless tobacco: Current    Types: Snuff  Vaping Use   Vaping status: Never Used  Substance and Sexual Activity   Alcohol use: Yes    Comment: rare use   Drug use: No   Sexual activity: Yes

## 2024-02-07 ENCOUNTER — Encounter: Payer: Self-pay | Admitting: Orthopedic Surgery

## 2024-02-12 ENCOUNTER — Other Ambulatory Visit: Payer: Self-pay

## 2024-03-10 NOTE — Progress Notes (Signed)
 Surgical Instructions   Your procedure is scheduled on March 21, 2024. Report to [facility] Main Main Entrance "A" at 5:30 A.M., then check in with the Admitting office. Any questions or running late day of surgery: call [phone]  Questions prior to your surgery date: call [phone], Monday-Friday, 8am-4pm. If you experience any cold or flu symptoms such as cough, fever, chills, shortness of breath, etc. between now and your scheduled surgery, please notify us at the above number.     Remember:  Do not eat after midnight the night before your surgery  You may drink clear liquids until 4:30 the morning of your surgery.   Clear liquids allowed are: Water, Non-Citrus Juices (without pulp), Carbonated Beverages, Clear Tea (no milk, honey, etc.), Black Coffee Only (NO MILK, CREAM OR POWDERED CREAMER of any kind), and Gatorade. Patient Instructions   The day of surgery (if you have diabetes): Drink ONE (1) 12 oz G2 given to you in your pre admission testing appointment by 4:30 the morning of surgery. Drink in one sitting. Do not sip.  This drink was given to you during your hospital  pre-op appointment visit.  Nothing else to drink after completing the  12 oz bottle of G2.         If you have questions, please contact your surgeon's office.    Take these medicines the morning of surgery with A SIP OF WATER  atorvastatin (LIPITOR)   May take these medicines IF NEEDED: cetirizine (ZYRTEC)  fluticasone (FLONASE)  gabapentin (NEURONTIN)  tamsulosin (FLOMAX)   One week prior to surgery, STOP taking any Aspirin (unless otherwise instructed by your surgeon) Aleve, Naproxen, Ibuprofen, Motrin, Advil, Goody's, BC's, all herbal medications, fish oil, and non-prescription vitamins. THIS INCLUDES YOUR meloxicam (MOBIC).               WHAT DO I DO ABOUT MY DIABETES MEDICATION?   Do not take oral diabetes medicines glipiZIDE (GLUCOTROL XL)  metFORMIN (GLUCOPHAGE)  the morning of      surgery. .  The day of surgery, do not take other diabetes injectables, including Byetta (exenatide), Bydureon (exenatide ER), Victoza (liraglutide), or Trulicity (dulaglutide).  If your CBG is greater than 220 mg/dL, you may take  of your sliding scale (correction) dose of insulin.   HOW TO MANAGE YOUR DIABETES BEFORE AND AFTER SURGERY  Why is it important to control my blood sugar before and after surgery? Improving blood sugar levels before and after surgery helps healing and can limit problems. A way of improving blood sugar control is eating a healthy diet by:  Eating less sugar and carbohydrates  Increasing activity/exercise  Talking with your doctor about reaching your blood sugar goals High blood sugars (greater than 180 mg/dL) can raise your risk of infections and slow your recovery, so you will need to focus on controlling your diabetes during the weeks before surgery. Make sure that the doctor who takes care of your diabetes knows about your planned surgery including the date and location.  How do I manage my blood sugar before surgery? Check your blood sugar at least 4 times a day, starting 2 days before surgery, to make sure that the level is not too high or low.  Check your blood sugar the morning of your surgery when you wake up and every 2 hours until you get to the Short Stay unit.  If your blood sugar is less than 70 mg/dL, you will need to treat for low blood sugar: Do not  take insulin. Treat a low blood sugar (less than 70 mg/dL) with  cup of clear juice (cranberry or apple), 4 glucose tablets, OR glucose gel. Recheck blood sugar in 15 minutes after treatment (to make sure it is greater than 70 mg/dL). If your blood sugar is not greater than 70 mg/dL on recheck, call [phone] for further instructions. Report your blood sugar to the short stay nurse when you get to Short Stay.  If you are admitted to the hospital after surgery: Your blood sugar will be checked  by the staff and you will probably be given insulin after surgery (instead of oral diabetes medicines) to make sure you have good blood sugar levels. The goal for blood sugar control after surgery is 80-180 mg/dL.         Do NOT Smoke (Tobacco/Vaping) for 24 hours prior to your procedure.  If you use a CPAP at night, you may bring your mask/headgear for your overnight stay.   You will be asked to remove any contacts, glasses, piercing's, hearing aid's, dentures/partials prior to surgery. Please bring cases for these items if needed.    Patients discharged the day of surgery will not be allowed to drive home, and someone needs to stay with them for 24 hours.  SURGICAL WAITING ROOM VISITATION Patients may have no more than 2 support people in the waiting area - these visitors may rotate.   Pre-op nurse will coordinate an appropriate time for 1 ADULT support person, who may not rotate, to accompany patient in pre-op.  Children under the age of 66 must have an adult with them who is not the patient and must remain in the main waiting area with an adult.  If the patient needs to stay at the hospital during part of their recovery, the visitor guidelines for inpatient rooms apply.  Please refer to the [facility] website for the visitor guidelines for any additional information.   If you received a COVID test during your pre-op visit  it is requested that you wear a mask when out in public, stay away from anyone that may not be feeling well and notify your surgeon if you develop symptoms. If you have been in contact with anyone that has tested positive in the last 10 days please notify you surgeon.  Oral Hygiene is also important to reduce your risk of infection.  Remember - BRUSH YOUR TEETH THE MORNING OF SURGERY WITH YOUR REGULAR TOOTHPASTE  [facility]- Preparing for Total Shoulder Arthroplasty  Before surgery, you can play an important role. Because skin is not sterile, your skin needs to be  as free of germs as possible. You can reduce the number of germs on your skin by using the following products.   Benzoyl Peroxide Gel  o Reduces the number of germs present on the skin  o Applied twice a day to shoulder area starting two days before surgery   Chlorhexidine Gluconate (CHG) Soap (instructions listed above on how to wash with CHG Soap)  o An antiseptic cleaner that kills germs and bonds with the skin to continue killing germs even after washing  o Used for showering the night before surgery and morning of surgery   ==================================================================  Please follow these instructions carefully:  BENZOYL PEROXIDE 5% GEL  Please do not use if you have an allergy to benzoyl peroxide. If your skin becomes reddened/irritated stop using the benzoyl peroxide.  Starting two days before surgery, apply as follows:  1. Apply benzoyl peroxide in  the morning and at night. Apply after taking a shower. If you are not taking a shower clean entire shoulder front, back, and side along with the armpit with a clean wet washcloth.  2. Place a quarter-sized dollop on your SHOULDER and rub in thoroughly, making sure to cover the front, back, and side of your shoulder, along with the armpit.   2 Days prior to Surgery First Dose on _____________ Morning Second Dose on ______________ Night  Day Before Surgery First Dose on ______________ Morning Night before surgery wash (entire body except face and private areas) with CHG Soap THEN Second Dose on ____________ Night   Morning of Surgery  wash BODY AGAIN with CHG Soap   4. Do NOT apply benzoyl peroxide gel on the day of surgery   [facility]- Preparing For Surgery  Before surgery, you can play an important role. Because skin is not sterile, your skin needs to be as free of germs as possible. You can reduce the number of germs on your skin by washing with CHG (chlorahexidine gluconate) Soap before  surgery.  CHG is an antiseptic cleaner which kills germs and bonds with the skin to continue killing germs even after washing.     Please do not use if you have an allergy to CHG or antibacterial soaps. If your skin becomes reddened/irritated stop using the CHG.  Do not shave (including legs and underarms) for at least 48 hours prior to first CHG shower. It is OK to shave your face.  Please follow these instructions carefully.     Shower the NIGHT BEFORE SURGERY and the MORNING OF SURGERY with CHG Soap.   If you chose to wash your hair, wash your hair first as usual with your normal shampoo. After you shampoo, rinse your hair and body thoroughly to remove the shampoo.  Then [employer] and genitals (private parts) with your normal soap and rinse thoroughly to remove soap.  After that Use CHG Soap as you would any other liquid soap. You can apply CHG directly to the skin and wash gently with a scrungie or a clean washcloth.   Apply the CHG Soap to your body ONLY FROM THE NECK DOWN.  Do not use on open wounds or open sores. Avoid contact with your eyes, ears, mouth and genitals (private parts). Wash Face and genitals (private parts)  with your normal soap.   Wash thoroughly, paying special attention to the area where your surgery will be performed.  Thoroughly rinse your body with warm water from the neck down.  DO NOT shower/wash with your normal soap after using and rinsing off the CHG Soap.  Pat yourself dry with a CLEAN TOWEL.  8. Apply the Benzoyl Peroxide only the night before surgery.  Do Not use it the morning of surgery.  Wear CLEAN PAJAMAS to bed the night before surgery  Place CLEAN SHEETS on your bed the night before your surgery  DO NOT SLEEP WITH PETS.   Day of Surgery: Take a shower with CHG soap. Wear Clean/Comfortable clothing the morning of surgery Do not apply any deodorants/lotions.   Remember to brush your teeth WITH YOUR REGULAR TOOTHPASTE.   Please read over  the following fact sheets that you were given.          Pre-operative 5 CHG Bathing Instructions   You can play a key role in reducing the risk of infection after surgery. Your skin needs to be as free of germs as possible. You can  reduce the number of germs on your skin by washing with CHG (chlorhexidine gluconate) soap before surgery. CHG is an antiseptic soap that kills germs and continues to kill germs even after washing.   DO NOT use if you have an allergy to chlorhexidine/CHG or antibacterial soaps. If your skin becomes reddened or irritated, stop using the CHG and notify one of our RNs at [phone].   Please shower with the CHG soap starting 4 days before surgery using the following schedule:     Please keep in mind the following:  DO NOT shave, including legs and underarms, starting the day of your first shower.   You may shave your face at any point before/day of surgery.  Place clean sheets on your bed the day you start using CHG soap. Use a clean washcloth (not used since being washed) for each shower. DO NOT sleep with pets once you start using the CHG.   CHG Shower Instructions:  Wash your face and private area with normal soap. If you choose to wash your hair, wash first with your normal shampoo.  After you use shampoo/soap, rinse your hair and body thoroughly to remove shampoo/soap residue.  Turn the water OFF and apply about 3 tablespoons (45 ml) of CHG soap to a CLEAN washcloth.  Apply CHG soap ONLY FROM YOUR NECK DOWN TO YOUR TOES (washing for 3-5 minutes)  DO NOT use CHG soap on face, private areas, open wounds, or sores.  Pay special attention to the area where your surgery is being performed.  If you are having back surgery, having someone wash your back for you may be helpful. Wait 2 minutes after CHG soap is applied, then you may rinse off the CHG soap.  Pat dry with a clean towel  Put on clean clothes/pajamas   If you choose to wear lotion, please use  ONLY the CHG-compatible lotions that are listed below.  Additional instructions for the day of surgery: DO NOT APPLY any lotions, deodorants, cologne, or perfumes.   Do not bring valuables to the hospital. [facility] is not responsible for any belongings/valuables. Do not wear nail polish, gel polish, artificial nails, or any other type of covering on natural nails (fingers and toes) Do not wear jewelry or makeup Put on clean/comfortable clothes.  Please brush your teeth.  Ask your nurse before applying any prescription medications to the skin.     CHG Compatible Lotions   Aveeno Moisturizing lotion  Cetaphil Moisturizing Cream  Cetaphil Moisturizing Lotion  Clairol Herbal Essence Moisturizing Lotion, Dry Skin  Clairol Herbal Essence Moisturizing Lotion, Extra Dry Skin  Clairol Herbal Essence Moisturizing Lotion, Normal Skin  Curel Age Defying Therapeutic Moisturizing Lotion with Alpha Hydroxy  Curel Extreme Care Body Lotion  Curel Soothing Hands Moisturizing Hand Lotion  Curel Therapeutic Moisturizing Cream, Fragrance-Free  Curel Therapeutic Moisturizing Lotion, Fragrance-Free  Curel Therapeutic Moisturizing Lotion, Original Formula  Eucerin Daily Replenishing Lotion  Eucerin Dry Skin Therapy Plus Alpha Hydroxy Crme  Eucerin Dry Skin Therapy Plus Alpha Hydroxy Lotion  Eucerin Original Crme  Eucerin Original Lotion  Eucerin Plus Crme Eucerin Plus Lotion  Eucerin TriLipid Replenishing Lotion  Keri Anti-Bacterial Hand Lotion  Keri Deep Conditioning Original Lotion Dry Skin Formula Softly Scented  Keri Deep Conditioning Original Lotion, Fragrance Free Sensitive Skin Formula  Keri Lotion Fast Absorbing Fragrance Free Sensitive Skin Formula  Keri Lotion Fast Absorbing Softly Scented Dry Skin Formula  Keri Original Lotion  Keri Skin Renewal Lotion Keri Silky Smooth Lotion  Keri Silky Smooth Sensitive Skin Lotion  Nivea Body Creamy Conditioning Oil  Nivea Body Extra Enriched  Lotion  Nivea Body Original Lotion  Nivea Body Sheer Moisturizing Lotion Nivea Crme  Nivea Skin Firming Lotion  NutraDerm 30 Skin Lotion  NutraDerm Skin Lotion  NutraDerm Therapeutic Skin Cream  NutraDerm Therapeutic Skin Lotion  ProShield Protective Hand Cream  Provon moisturizing lotion  Please read over the following fact sheets that you were given.

## 2024-03-11 ENCOUNTER — Other Ambulatory Visit: Payer: Self-pay

## 2024-03-11 ENCOUNTER — Encounter (HOSPITAL_COMMUNITY): Payer: Self-pay

## 2024-03-11 ENCOUNTER — Encounter (HOSPITAL_COMMUNITY)
Admission: RE | Admit: 2024-03-11 | Discharge: 2024-03-11 | Disposition: A | Discharge status: Home / Self Care | Source: Ambulatory Visit | Attending: Orthopedic Surgery | Admitting: Orthopedic Surgery

## 2024-03-11 VITALS — BP 142/82 | HR 58 | Temp 97.8°F | Resp 18 | Ht 65.0 in | Wt 191.6 lb

## 2024-03-11 DIAGNOSIS — Z01818 Encounter for other preprocedural examination: Secondary | ICD-10-CM

## 2024-03-11 LAB — CBC
HCT: 43.4 % (ref 39.0–52.0)
Hemoglobin: 14.5 g/dL (ref 13.0–17.0)
MCH: 31.1 pg (ref 26.0–34.0)
MCHC: 33.4 g/dL (ref 30.0–36.0)
MCV: 93.1 fL (ref 80.0–100.0)
Platelets: 229 10*3/uL (ref 150–400)
RBC: 4.66 MIL/uL (ref 4.22–5.81)
RDW: 13.2 % (ref 11.5–15.5)
WBC: 6.4 10*3/uL (ref 4.0–10.5)
nRBC: 0 % (ref 0.0–0.2)

## 2024-03-11 LAB — URINALYSIS, W/ REFLEX TO CULTURE (INFECTION SUSPECTED)
Bacteria, UA: NONE SEEN
Bilirubin Urine: NEGATIVE
Glucose, UA: NEGATIVE mg/dL
Hgb urine dipstick: NEGATIVE
Ketones, ur: NEGATIVE mg/dL
Leukocytes,Ua: NEGATIVE
Nitrite: NEGATIVE
Protein, ur: NEGATIVE mg/dL
Specific Gravity, Urine: 1.006 (ref 1.005–1.030)
pH: 5 (ref 5.0–8.0)

## 2024-03-11 LAB — BASIC METABOLIC PANEL
Anion gap: 6 (ref 5–15)
BUN: 10 mg/dL (ref 8–23)
CO2: 25 mmol/L (ref 22–32)
Calcium: 9.1 mg/dL (ref 8.9–10.3)
Chloride: 107 mmol/L (ref 98–111)
Creatinine, Ser: 0.87 mg/dL (ref 0.61–1.24)
GFR, Estimated: >60 mL/min (ref >60)
Glucose, Bld: 184 mg/dL — ABNORMAL HIGH (ref 70–99)
Potassium: 3.8 mmol/L (ref 3.5–5.1)
Sodium: 138 mmol/L (ref 135–145)

## 2024-03-11 LAB — GLUCOSE, CAPILLARY: Glucose-Capillary: 201 mg/dL — ABNORMAL HIGH (ref 70–99)

## 2024-03-11 LAB — SURGICAL PCR SCREEN
MRSA, PCR: NEGATIVE
Staphylococcus aureus: POSITIVE — AB

## 2024-03-11 LAB — HEMOGLOBIN A1C
Hgb A1c MFr Bld: 6.6 % — ABNORMAL HIGH (ref 4.8–5.6)
Mean Plasma Glucose: 142.72 mg/dL

## 2024-03-11 NOTE — Progress Notes (Signed)
 PCP -[facility] Dr. Delbert Harness Cardiologist - denies  PPM/ICD - denies Device Orders - n/a Rep Notified - n/a  Chest x-ray - 04-03-22 EKG - 03-11-24 Stress Test - denies ECHO - denies Cardiac Cath - denies  Sleep Study - denies CPAP - n/a  Fasting Blood Sugar -  per patient blood sugar runs 77-150. Blood sugar at PAT appointment 201 per patient he ate breakfast eggs toast and bacon Checks Blood Sugar 3-4- times a week Last Aic 01-12-23 7.6  Last dose of GLP1 agonist-  denies GLP1 instructions: no  Blood Thinner Instructions: denies Aspirin Instructions: Instructed patient to follow up with surgeon  ERAS Protcol - clear liquids until 4:30 PRE-SURGERY G2-   COVID TEST- no   Anesthesia review: yes Review EKG  Patient denies shortness of breath, fever, cough and chest pain at PAT appointment   All instructions explained to the patient, with a verbal understanding of the material. Patient agrees to go over the instructions while at home for a better understanding. Patient also instructed to self quarantine after being tested for COVID-19. The opportunity to ask questions was provided.

## 2024-03-19 NOTE — Anesthesia Preprocedure Evaluation (Signed)
 Anesthesia Evaluation  Patient identified by MRN, date of birth, ID band Patient awake    Reviewed: Allergy & Precautions, NPO status , Patient's Chart, lab work & pertinent test results  History of Anesthesia Complications (+) history of anesthetic complications  Airway Mallampati: III  TM Distance: >3 FB Neck ROM: Full    Dental  (+) Dental Advisory Given   Pulmonary neg shortness of breath, neg sleep apnea, neg COPD, neg recent URI, former smoker   Pulmonary exam normal breath sounds clear to auscultation       Cardiovascular hypertension (lisinopril), Pt. on medications (-) angina (-) Past MI, (-) Cardiac Stents and (-) CABG + dysrhythmias (PVCs)  Rhythm:Regular Rate:Normal  HLD   Neuro/Psych negative neurological ROS     GI/Hepatic Neg liver ROS,GERD  ,,Diverticulosis    Endo/Other  diabetes (Hgb A1c 6.6), Well Controlled, Type 2, Oral Hypoglycemic Agents    Renal/GU negative Renal ROS     Musculoskeletal  (+) Arthritis ,    Abdominal  (+) + obese  Peds  Hematology negative hematology ROS (+) Lab Results      Component                Value               Date                      WBC                      6.4                 03/11/2024                HGB                      14.5                03/11/2024                HCT                      43.4                03/11/2024                MCV                      93.1                03/11/2024                PLT                      229                 03/11/2024              Anesthesia Other Findings   Reproductive/Obstetrics                             Anesthesia Physical Anesthesia Plan  ASA: 3  Anesthesia Plan: General   Post-op Pain Management: Regional block* and Tylenol PO (pre-op)*   Induction: Intravenous  PONV Risk Score and Plan: 2 and Ondansetron, Dexamethasone and Treatment may vary due to age or medical  condition  Airway Management Planned:  Oral ETT  Additional Equipment:   Intra-op Plan:   Post-operative Plan: Extubation in OR  Informed Consent: I have reviewed the patients History and Physical, chart, labs and discussed the procedure including the risks, benefits and alternatives for the proposed anesthesia with the patient or authorized representative who has indicated his/her understanding and acceptance.     Dental advisory given  Plan Discussed with: CRNA and Anesthesiologist  Anesthesia Plan Comments: (Discussed potential risks of nerve blocks including, but not limited to, infection, bleeding, nerve damage, seizures, pneumothorax, respiratory depression, and potential failure of the block. Alternatives to nerve blocks discussed. All questions answered.  Risks of general anesthesia discussed including, but not limited to, sore throat, hoarse voice, chipped/damaged teeth, injury to vocal cords, nausea and vomiting, allergic reactions, lung infection, heart attack, stroke, and death. All questions answered. )       Anesthesia Quick Evaluation

## 2024-03-20 ENCOUNTER — Observation Stay (HOSPITAL_COMMUNITY)
Admission: RE | Admit: 2024-03-20 | Discharge: 2024-03-21 | Disposition: A | Discharge status: Home Health | Payer: Medicare HMO | Attending: Orthopedic Surgery | Admitting: Orthopedic Surgery

## 2024-03-20 ENCOUNTER — Encounter (HOSPITAL_COMMUNITY): Payer: Self-pay | Admitting: Orthopedic Surgery

## 2024-03-20 ENCOUNTER — Other Ambulatory Visit: Payer: Self-pay

## 2024-03-20 ENCOUNTER — Ambulatory Visit (HOSPITAL_COMMUNITY): Payer: Self-pay | Admitting: Physician Assistant

## 2024-03-20 ENCOUNTER — Encounter (HOSPITAL_COMMUNITY)
Admission: RE | Disposition: A | Discharge status: Home Health | Payer: Self-pay | Source: Home / Self Care | Attending: Orthopedic Surgery

## 2024-03-20 ENCOUNTER — Ambulatory Visit (HOSPITAL_BASED_OUTPATIENT_CLINIC_OR_DEPARTMENT_OTHER): Payer: Self-pay | Admitting: Anesthesiology

## 2024-03-20 ENCOUNTER — Observation Stay (HOSPITAL_COMMUNITY)

## 2024-03-20 DIAGNOSIS — Z79899 Other long term (current) drug therapy: Secondary | ICD-10-CM | POA: Diagnosis not present

## 2024-03-20 DIAGNOSIS — Z7982 Long term (current) use of aspirin: Secondary | ICD-10-CM | POA: Diagnosis not present

## 2024-03-20 DIAGNOSIS — E119 Type 2 diabetes mellitus without complications: Secondary | ICD-10-CM

## 2024-03-20 DIAGNOSIS — M19012 Primary osteoarthritis, left shoulder: Secondary | ICD-10-CM

## 2024-03-20 DIAGNOSIS — Z87891 Personal history of nicotine dependence: Secondary | ICD-10-CM | POA: Insufficient documentation

## 2024-03-20 DIAGNOSIS — I10 Essential (primary) hypertension: Secondary | ICD-10-CM | POA: Diagnosis not present

## 2024-03-20 DIAGNOSIS — Z96612 Presence of left artificial shoulder joint: Secondary | ICD-10-CM

## 2024-03-20 DIAGNOSIS — Z7984 Long term (current) use of oral hypoglycemic drugs: Secondary | ICD-10-CM

## 2024-03-20 DIAGNOSIS — Z01818 Encounter for other preprocedural examination: Principal | ICD-10-CM

## 2024-03-20 DIAGNOSIS — Z96641 Presence of right artificial hip joint: Secondary | ICD-10-CM | POA: Insufficient documentation

## 2024-03-20 DIAGNOSIS — Z96653 Presence of artificial knee joint, bilateral: Secondary | ICD-10-CM | POA: Insufficient documentation

## 2024-03-20 DIAGNOSIS — M19019 Primary osteoarthritis, unspecified shoulder: Secondary | ICD-10-CM | POA: Diagnosis present

## 2024-03-20 HISTORY — PX: REVERSE SHOULDER ARTHROPLASTY: SHX5054

## 2024-03-20 LAB — GLUCOSE, CAPILLARY
Glucose-Capillary: 168 mg/dL — ABNORMAL HIGH (ref 70–99)
Glucose-Capillary: 165 mg/dL — ABNORMAL HIGH (ref 70–99)

## 2024-03-20 SURGERY — ARTHROPLASTY, SHOULDER, TOTAL, REVERSE
Anesthesia: General | Site: Shoulder | Laterality: Left

## 2024-03-20 MED ORDER — POVIDONE-IODINE 7.5 % EX SOLN
Freq: Once | CUTANEOUS | Status: DC
  Filled 2024-03-20: qty 118

## 2024-03-20 MED ORDER — CHLORHEXIDINE GLUCONATE 0.12 % MT SOLN
15.0000 mL | Freq: Once | OROMUCOSAL | Status: AC
  Administered 2024-03-20: 15 mL via OROMUCOSAL
  Filled 2024-03-20: qty 15

## 2024-03-20 MED ORDER — VANCOMYCIN HCL 1000 MG IV SOLR
INTRAVENOUS | Status: DC | PRN
  Administered 2024-03-20: 1000 mg via TOPICAL

## 2024-03-20 MED ORDER — TAMSULOSIN HCL 0.4 MG PO CAPS: 0.4000 mg | ORAL_CAPSULE | Freq: Every day | ORAL | Status: DC | PRN

## 2024-03-20 MED ORDER — PROPOFOL 10 MG/ML IV BOLUS
INTRAVENOUS | Status: AC
  Filled 2024-03-20: qty 20

## 2024-03-20 MED ORDER — 0.9 % SODIUM CHLORIDE (POUR BTL) OPTIME
TOPICAL | Status: DC | PRN
  Administered 2024-03-20: 1000 mL

## 2024-03-20 MED ORDER — GABAPENTIN 100 MG PO CAPS: 100.0000 mg | ORAL_CAPSULE | Freq: Three times a day (TID) | ORAL | Status: DC | PRN

## 2024-03-20 MED ORDER — PHENOL 1.4 % MT LIQD: 1.0000 | OROMUCOSAL | Status: DC | PRN

## 2024-03-20 MED ORDER — IRRISEPT - 450ML BOTTLE WITH 0.05% CHG IN STERILE WATER, USP 99.95% OPTIME
TOPICAL | Status: DC | PRN
  Administered 2024-03-20 (×2): 450 mL

## 2024-03-20 MED ORDER — ONDANSETRON HCL 4 MG PO TABS: 4.0000 mg | ORAL_TABLET | Freq: Four times a day (QID) | ORAL | Status: DC | PRN

## 2024-03-20 MED ORDER — EPHEDRINE SULFATE-NACL 50-0.9 MG/10ML-% IV SOSY
PREFILLED_SYRINGE | INTRAVENOUS | Status: DC | PRN
  Administered 2024-03-20: 10 mg via INTRAVENOUS
  Administered 2024-03-20 (×3): 5 mg via INTRAVENOUS
  Administered 2024-03-20: 10 mg via INTRAVENOUS

## 2024-03-20 MED ORDER — ACETAMINOPHEN 500 MG PO TABS
1000.0000 mg | ORAL_TABLET | Freq: Once | ORAL | Status: AC
  Administered 2024-03-20: 1000 mg via ORAL
  Filled 2024-03-20: qty 2

## 2024-03-20 MED ORDER — MENTHOL 3 MG MT LOZG: 1.0000 | LOZENGE | OROMUCOSAL | Status: DC | PRN

## 2024-03-20 MED ORDER — AMISULPRIDE (ANTIEMETIC) 5 MG/2ML IV SOLN: 10.0000 mg | Freq: Once | INTRAVENOUS | Status: DC | PRN

## 2024-03-20 MED ORDER — METOCLOPRAMIDE HCL 5 MG PO TABS
5.0000 mg | ORAL_TABLET | Freq: Three times a day (TID) | ORAL | Status: DC | PRN
  Administered 2024-03-20: 10 mg via ORAL
  Filled 2024-03-20: qty 2

## 2024-03-20 MED ORDER — OXYCODONE HCL 5 MG PO TABS: 5.0000 mg | ORAL_TABLET | ORAL | Status: DC | PRN

## 2024-03-20 MED ORDER — MUPIROCIN 2 % EX OINT
1.0000 | TOPICAL_OINTMENT | Freq: Once | CUTANEOUS | Status: AC
  Administered 2024-03-20: 1 via TOPICAL
  Filled 2024-03-20: qty 22

## 2024-03-20 MED ORDER — ONDANSETRON HCL 4 MG/2ML IJ SOLN
INTRAMUSCULAR | Status: DC | PRN
  Administered 2024-03-20: 4 mg via INTRAVENOUS

## 2024-03-20 MED ORDER — INSULIN ASPART 100 UNIT/ML IJ SOLN: 0.0000 [IU] | INTRAMUSCULAR | Status: DC | PRN

## 2024-03-20 MED ORDER — ACETAMINOPHEN 500 MG PO TABS
1000.0000 mg | ORAL_TABLET | Freq: Four times a day (QID) | ORAL | Status: AC
  Administered 2024-03-20 – 2024-03-21 (×4): 1000 mg via ORAL
  Filled 2024-03-20 (×4): qty 2

## 2024-03-20 MED ORDER — CEFAZOLIN SODIUM-DEXTROSE 2-4 GM/100ML-% IV SOLN
2.0000 g | INTRAVENOUS | Status: AC
  Administered 2024-03-20: 2 g via INTRAVENOUS
  Filled 2024-03-20: qty 100

## 2024-03-20 MED ORDER — BUPIVACAINE HCL (PF) 0.5 % IJ SOLN
INTRAMUSCULAR | Status: DC | PRN
Start: 2024-03-20 — End: 2024-03-20
  Administered 2024-03-20: 10 mL via PERINEURAL

## 2024-03-20 MED ORDER — LIDOCAINE 2% (20 MG/ML) 5 ML SYRINGE
INTRAMUSCULAR | Status: DC | PRN
  Administered 2024-03-20: 80 mg via INTRAVENOUS

## 2024-03-20 MED ORDER — BUPIVACAINE LIPOSOME 1.3 % IJ SUSP
INTRAMUSCULAR | Status: DC | PRN
  Administered 2024-03-20: 10 mL via PERINEURAL

## 2024-03-20 MED ORDER — TRANEXAMIC ACID-NACL 1000-0.7 MG/100ML-% IV SOLN
1000.0000 mg | INTRAVENOUS | Status: AC
  Administered 2024-03-20: 1000 mg via INTRAVENOUS
  Filled 2024-03-20: qty 100

## 2024-03-20 MED ORDER — FENTANYL CITRATE (PF) 100 MCG/2ML IJ SOLN
INTRAMUSCULAR | Status: AC
  Filled 2024-03-20: qty 2

## 2024-03-20 MED ORDER — METHOCARBAMOL 500 MG PO TABS: 500.0000 mg | ORAL_TABLET | Freq: Four times a day (QID) | ORAL | Status: DC | PRN

## 2024-03-20 MED ORDER — LACTATED RINGERS IV SOLN: INTRAVENOUS | Status: DC

## 2024-03-20 MED ORDER — HYDROMORPHONE HCL 1 MG/ML IJ SOLN: 0.5000 mg | INTRAMUSCULAR | Status: DC | PRN

## 2024-03-20 MED ORDER — POVIDONE-IODINE 10 % EX SWAB: 2.0000 | Freq: Once | CUTANEOUS | Status: DC

## 2024-03-20 MED ORDER — CEFAZOLIN SODIUM-DEXTROSE 2-4 GM/100ML-% IV SOLN
2.0000 g | Freq: Three times a day (TID) | INTRAVENOUS | Status: AC
  Administered 2024-03-20 – 2024-03-21 (×3): 2 g via INTRAVENOUS
  Filled 2024-03-20 (×3): qty 100

## 2024-03-20 MED ORDER — METHOCARBAMOL 1000 MG/10ML IJ SOLN: 500.0000 mg | Freq: Four times a day (QID) | INTRAMUSCULAR | Status: DC | PRN

## 2024-03-20 MED ORDER — OXYCODONE HCL 5 MG/5ML PO SOLN: 5.0000 mg | Freq: Once | ORAL | Status: DC | PRN

## 2024-03-20 MED ORDER — VASOPRESSIN 20 UNIT/ML IV SOLN
INTRAVENOUS | Status: AC
  Filled 2024-03-20: qty 1

## 2024-03-20 MED ORDER — FENTANYL CITRATE (PF) 100 MCG/2ML IJ SOLN: 25.0000 ug | INTRAMUSCULAR | Status: DC | PRN

## 2024-03-20 MED ORDER — OXYCODONE HCL 5 MG PO TABS: 5.0000 mg | ORAL_TABLET | Freq: Once | ORAL | Status: DC | PRN

## 2024-03-20 MED ORDER — LISINOPRIL 20 MG PO TABS
20.0000 mg | ORAL_TABLET | Freq: Every day | ORAL | Status: DC
  Administered 2024-03-20 – 2024-03-21 (×2): 20 mg via ORAL
  Filled 2024-03-20 (×2): qty 1

## 2024-03-20 MED ORDER — VANCOMYCIN HCL 1000 MG IV SOLR
INTRAVENOUS | Status: AC
  Filled 2024-03-20: qty 20

## 2024-03-20 MED ORDER — BUPIVACAINE LIPOSOME 1.3 % IJ SUSP
INTRAMUSCULAR | Status: AC
  Filled 2024-03-20: qty 10

## 2024-03-20 MED ORDER — ORAL CARE MOUTH RINSE: 15.0000 mL | Freq: Once | OROMUCOSAL | Status: AC

## 2024-03-20 MED ORDER — GLIPIZIDE ER 5 MG PO TB24
5.0000 mg | ORAL_TABLET | Freq: Every day | ORAL | Status: DC
  Administered 2024-03-21: 5 mg via ORAL
  Filled 2024-03-20: qty 1

## 2024-03-20 MED ORDER — ASPIRIN 81 MG PO TBEC
81.0000 mg | DELAYED_RELEASE_TABLET | Freq: Every day | ORAL | Status: DC
  Administered 2024-03-21: 81 mg via ORAL
  Filled 2024-03-20: qty 1

## 2024-03-20 MED ORDER — PROPOFOL 10 MG/ML IV BOLUS
INTRAVENOUS | Status: DC | PRN
  Administered 2024-03-20: 150 mg via INTRAVENOUS

## 2024-03-20 MED ORDER — DOCUSATE SODIUM 100 MG PO CAPS
100.0000 mg | ORAL_CAPSULE | Freq: Two times a day (BID) | ORAL | Status: DC
  Administered 2024-03-20 – 2024-03-21 (×3): 100 mg via ORAL
  Filled 2024-03-20 (×3): qty 1

## 2024-03-20 MED ORDER — MIDAZOLAM HCL 2 MG/2ML IJ SOLN
INTRAMUSCULAR | Status: AC
  Filled 2024-03-20: qty 2

## 2024-03-20 MED ORDER — METOCLOPRAMIDE HCL 5 MG/ML IJ SOLN: 5.0000 mg | Freq: Three times a day (TID) | INTRAMUSCULAR | Status: DC | PRN

## 2024-03-20 MED ORDER — FENTANYL CITRATE (PF) 250 MCG/5ML IJ SOLN
INTRAMUSCULAR | Status: DC | PRN
Start: 2024-03-20 — End: 2024-03-20
  Administered 2024-03-20 (×2): 50 ug via INTRAVENOUS

## 2024-03-20 MED ORDER — ACETAMINOPHEN 325 MG PO TABS: 325.0000 mg | ORAL_TABLET | Freq: Four times a day (QID) | ORAL | Status: DC | PRN

## 2024-03-20 MED ORDER — ROCURONIUM BROMIDE 10 MG/ML (PF) SYRINGE
PREFILLED_SYRINGE | INTRAVENOUS | Status: DC | PRN
  Administered 2024-03-20: 60 mg via INTRAVENOUS

## 2024-03-20 MED ORDER — CELECOXIB 100 MG PO CAPS
100.0000 mg | ORAL_CAPSULE | Freq: Two times a day (BID) | ORAL | Status: DC
  Administered 2024-03-20 – 2024-03-21 (×3): 100 mg via ORAL
  Filled 2024-03-20 (×4): qty 1

## 2024-03-20 MED ORDER — METFORMIN HCL 500 MG PO TABS
2000.0000 mg | ORAL_TABLET | Freq: Every day | ORAL | Status: DC
  Administered 2024-03-21: 2000 mg via ORAL
  Filled 2024-03-20: qty 4

## 2024-03-20 SURGICAL SUPPLY — 62 items
ALCOHOL 70% 16 OZ (MISCELLANEOUS) ×1 IMPLANT
BAG COUNTER SPONGE SURGICOUNT (BAG) ×1 IMPLANT
BASEPLATE AUG MED W-TAPER (Plate) IMPLANT
BEARING HUMERAL SHLDER 36M STD (Shoulder) IMPLANT
BIT DRILL 2.7 W/STOP DISP (BIT) IMPLANT
BIT DRILL QUICK REL 1/8 2PK SL (BIT) IMPLANT
BIT DRILL TWIST 2.7 (BIT) IMPLANT
BLADE SAW SGTL 13X75X1.27 (BLADE) ×1 IMPLANT
CHLORAPREP W/TINT 26 (MISCELLANEOUS) ×1 IMPLANT
COOLER ICEMAN CLASSIC (MISCELLANEOUS) ×1 IMPLANT
COVER SURGICAL LIGHT HANDLE (MISCELLANEOUS) ×1 IMPLANT
DRAPE INCISE IOBAN 66X45 STRL (DRAPES) ×1 IMPLANT
DRAPE U-SHAPE 47X51 STRL (DRAPES) ×2 IMPLANT
DRSG AQUACEL AG ADV 3.5X10 (GAUZE/BANDAGES/DRESSINGS) ×1 IMPLANT
ELECT BLADE 4.0 EZ CLEAN MEGAD (MISCELLANEOUS) ×1 IMPLANT
ELECT REM PT RETURN 9FT ADLT (ELECTROSURGICAL) ×1 IMPLANT
ELECTRODE BLDE 4.0 EZ CLN MEGD (MISCELLANEOUS) ×1 IMPLANT
ELECTRODE REM PT RTRN 9FT ADLT (ELECTROSURGICAL) ×1 IMPLANT
GAUZE SPONGE 4X4 12PLY STRL LF (GAUZE/BANDAGES/DRESSINGS) ×1 IMPLANT
GLENOID SPHERE 36+6 (Joint) IMPLANT
GLOVE BIOGEL PI IND STRL 6.5 (GLOVE) ×1 IMPLANT
GLOVE BIOGEL PI IND STRL 8 (GLOVE) ×1 IMPLANT
GLOVE ECLIPSE 6.5 STRL STRAW (GLOVE) ×1 IMPLANT
GLOVE ECLIPSE 8.0 STRL XLNG CF (GLOVE) ×1 IMPLANT
GOWN STRL REUS W/ TWL LRG LVL3 (GOWN DISPOSABLE) ×1 IMPLANT
GOWN STRL REUS W/ TWL XL LVL3 (GOWN DISPOSABLE) ×1 IMPLANT
GUIDE RSA SHLD BM ROT L SU (ORTHOPEDIC DISPOSABLE SUPPLIES) IMPLANT
HYDROGEN PEROXIDE 16OZ (MISCELLANEOUS) ×1 IMPLANT
JET LAVAGE IRRISEPT WOUND (IRRIGATION / IRRIGATOR) ×2 IMPLANT
KIT BASIN OR (CUSTOM PROCEDURE TRAY) ×1 IMPLANT
KIT TURNOVER KIT B (KITS) ×1 IMPLANT
LAVAGE JET IRRISEPT WOUND (IRRIGATION / IRRIGATOR) ×1 IMPLANT
MANIFOLD NEPTUNE II (INSTRUMENTS) ×1 IMPLANT
NDL SUT 6 .5 CRC .975X.05 MAYO (NEEDLE) IMPLANT
NS IRRIG 1000ML POUR BTL (IV SOLUTION) ×1 IMPLANT
PACK SHOULDER (CUSTOM PROCEDURE TRAY) ×1 IMPLANT
PAD COLD SHLDR WRAP-ON (PAD) ×1 IMPLANT
PIN THREADED REVERSE (PIN) IMPLANT
REAMER GUIDE BUSHING SURG DISP (MISCELLANEOUS) IMPLANT
RESTRAINT HEAD UNIVERSAL NS (MISCELLANEOUS) ×1 IMPLANT
RETRIEVER SUT HEWSON (MISCELLANEOUS) ×1 IMPLANT
SCREW BONE STRL 6.5MMX30MM (Screw) IMPLANT
SCREW LOCKING 4.75MMX15MM (Screw) IMPLANT
SCREW LOCKING NS 4.75MMX20MM (Screw) IMPLANT
SCREW LOCKING STRL 4.75X25X3.5 (Screw) IMPLANT
SHOULDER HUMERAL BEAR 36M STD (Shoulder) ×1 IMPLANT
SLING ARM IMMOBILIZER LRG (SOFTGOODS) ×1 IMPLANT
SOL PREP POV-IOD 4OZ 10% (MISCELLANEOUS) ×1 IMPLANT
SPONGE T-LAP 18X18 ~~LOC~~+RFID (SPONGE) ×1 IMPLANT
STEM HUMERAL STRL 9MMX83MM (Stem) IMPLANT
STRIP CLOSURE SKIN 1/2X4 (GAUZE/BANDAGES/DRESSINGS) ×1 IMPLANT
SUCTION TUBE FRAZIER 10FR DISP (SUCTIONS) ×1 IMPLANT
SUT BROADBAND TAPE 2PK 1.5 (SUTURE) IMPLANT
SUT MNCRL AB 3-0 PS2 18 (SUTURE) ×1 IMPLANT
SUT SILK 2 0 TIES 10X30 (SUTURE) ×1 IMPLANT
SUT VIC AB 0 CT1 27XBRD ANBCTR (SUTURE) ×4 IMPLANT
SUT VIC AB 1 CT1 27XBRD ANBCTR (SUTURE) ×2 IMPLANT
SUT VIC AB 2-0 CT2 27 (SUTURE) ×3 IMPLANT
SUT VICRYL 0 UR6 27IN ABS (SUTURE) ×2 IMPLANT
TOWEL GREEN STERILE (TOWEL DISPOSABLE) ×1 IMPLANT
TRAY HUM REV SHOULDER STD +6 (Shoulder) IMPLANT
WATER STERILE IRR 1000ML POUR (IV SOLUTION) ×1 IMPLANT

## 2024-03-20 NOTE — Op Note (Signed)
 NAME: Bobby Maxwell, Bobby Maxwell MEDICAL RECORD NO: [id number] ACCOUNT NO: [id number] DATE OF BIRTH: 19-Mar-1946 FACILITY: [facility] LOCATION: [id number] PHYSICIAN: Graylin Shiver. August Saucer, MD  Operative Report   DATE OF PROCEDURE: 03/20/2024  PREOPERATIVE DIAGNOSIS:  Left shoulder arthritis.  POSTOPERATIVE DIAGNOSIS:  Left shoulder arthritis.  PROCEDURE:  Left reverse shoulder replacement using comprehensive reverse shoulder system medium augmented baseplate with 36+6 glenosphere, mini humeral stem size 9 with mini standard thickness +6 tapered offset humeral tray and standard 36 mm bearing.  SURGEON:  Graylin Shiver. August Saucer, MD.  ASSISTANT:  Karenann Cai, PA.  INDICATIONS:  This is a 78 year old patient who is healthy with left shoulder pain refractory to nonoperative management.  He presents for operative management after explanation of risks and benefits.  DESCRIPTION OF PROCEDURE:  The patient was brought to the operating room where general endotracheal anesthesia was induced.  Preoperative antibiotics were administered.  Timeout was called.  The patient was placed in the beach chair position with the  head in neutral position.  The left arm was then prescrubbed with hydrogen peroxide followed by alcohol and Betadine, which was allowed to air dry and then ChloraPrep solution.  [name] was used to seal the operative field and cover the operative field.   After sterile prepping and draping, a timeout was called.  Deltopectoral approach was made.  Cephalic vein mobilized laterally.  IrriSept solution was utilized.  Subdeltoid and subacromial adhesions were manually released.  The anterior portion of the  deltoid was elevated manually off its attachment.  This was done as a sleeve.  Next, the Kolbel retractor was placed.  Axillary nerve palpated and protected at all times during the case.  Biceps tendon tenodesed using 5-0 Vicryl sutures to the upper  portion of the pectoralis, which was also partially released about 1.5 cm.   Tenodesis was performed and then the proximal aspect of the biceps tendon was then tagged and the transverse humeral ligament was then opened and the rotator interval was then  opened.  Circumflex vessels were ligated.  The subscapularis was then detached from the lesser tuberosity using a 15 blade.  Dissection was continued down under the humerus about 2 cm down around to the 7 o'clock position.  This was done with a Cobb  elevator and a sponge.  Next, the humeral head was dislocated.  Subscapularis release was performed with release of the humeral ligament as well as release of the rotator interval to the base of the coracoid.  Next, the reaming was performed on the  humeral head to size 9 mm.  Next, the head was cut in 30 degrees of retroversion along the medial aspect of the lesser tuberosity.  The broaching was then performed in 30 degrees of external rotation with very good bone quality encountered.  At this  time, size 9 broach remained in place and we covered it with a cap.  Posterior retractor and anterior retractor were then placed.  With no muscle paralysis, the labrum was circumferentially excised using electrocautery.  Care was taken to avoid injury to  the axillary nerve.  Bankart lesion created from the 12 o'clock to 6 o'clock position, which also assisted in subscapularis mobilization.  Guide was placed.  Two pins were placed.  Reaming was then performed in accordance with preoperative planning.   Posterior-superior reaming was then performed for the augment.  The trial fit perfectly within the created space.  Thorough irrigation was performed with IrriSept solution and the baseplate was then tapped into position with  excellent press fit obtained.   One central compression screw and four peripheral locking screws were then placed.  We then did trial reduction with multiple combinations and the most stable combination was the +6 offset glenosphere with the +6 offset humeral tray with standard   liner.  This gave excellent stability to internal and external rotation along with extension, adduction, and a forward force.  Difficult to relocate, difficult to reduce.  "Two fingers tight."  Trial components removed.  Stem was extracted and suture  tapes were placed through the lesser tuberosity x6 using a drill bit.  I irrigated out the humeral canal using irrigating solution and IrriSept solution.  Next, the +6 glenosphere was placed onto the dried Morse taper of the baseplate with very good fit  obtained.  The stem was then placed after suctioning out the IrriSept solution and placing some vancomycin powder in the humeral canal.  Then, the stem was placed and the same stability parameters were maintained with the +6 tray and the standard liner.   The true tray and liner were then placed onto the dried Morse taper of the humeral stem and the same stability parameters were maintained.  Thorough irrigation was then performed after reduction.  At this time, axillary nerve was palpated and intact.   Subscapularis was then repaired in 30 degrees of external rotation using NICE knots with the six SutureTapes.  At the end of the repair, we were able to externally rotate about 60 degrees.  Next, thorough irrigation was performed on the implant with  IrriSept solution and then vancomycin powder was placed on the implant and the rotator interval was closed using #1 Vicryl suture with the arm in 30 degrees of external rotation.  Next, the deltopectoral interval was closed after we placed some  vancomycin powder in this layer as well.  This was closed using #1 Vicryl suture followed by interrupted inverted 0 Vicryl suture, 2-0 Vicryl suture, and 3-0 Monocryl.  Steri-Strips and Aquacel dressing were applied.  Luke's assistance was required at  all times for retraction, opening and closing mobilization of tissue.  His assistance was a medical necessity.   PUS D: 03/20/2024 10:49:28 am T: 03/20/2024 1:51:00 pm   JOB: [id number]/ [id number]

## 2024-03-20 NOTE — Transfer of Care (Signed)
 Immediate Anesthesia Transfer of Care Note  Patient: Bobby Maxwell.  Procedure(s) Performed: LEFT REVERSE SHOULDER ARTHROPLASTY (Left: Shoulder)  Patient Location: PACU  Anesthesia Type:General  Level of Consciousness: awake, alert , and oriented  Airway & Oxygen Therapy: Patient Spontanous Breathing and Patient connected to face mask oxygen  Post-op Assessment: Report given to RN and Post -op Vital signs reviewed and stable  Post vital signs: Reviewed and stable  Last Vitals:  Vitals Value Taken Time  BP 130/67 03/20/24 1101  Temp    Pulse 73 03/20/24 1109  Resp 14 03/20/24 1109  SpO2 99 % 03/20/24 1109  Vitals shown include unfiled device data.  Last Pain:  Vitals:   03/20/24 0607  TempSrc:   PainSc: 0-No pain         Complications: No notable events documented.

## 2024-03-20 NOTE — Brief Op Note (Signed)
   03/20/2024  10:41 AM  PATIENT:  Lynetta Mare.  78 y.o. male  PRE-OPERATIVE DIAGNOSIS:  left shoulder arthritis  POST-OPERATIVE DIAGNOSIS:  left shoulder arthritis  PROCEDURE:  Procedure(s): LEFT REVERSE SHOULDER ARTHROPLASTY  SURGEON:  Surgeon(s): August Saucer Corrie Mckusick, MD  ASSISTANT: magnant pa  ANESTHESIA:   general  EBL: 75 ml    No intake/output data recorded.  BLOOD ADMINISTERED: none  DRAINS: none   LOCAL MEDICATIONS USED:  none  SPECIMEN:  No Specimen  COUNTS:  YES  TOURNIQUET:  * No tourniquets in log *  DICTATION: .Other Dictation: Dictation Number [id number]  PLAN OF CARE: Admit for overnight observation  PATIENT DISPOSITION:  PACU - hemodynamically stable

## 2024-03-20 NOTE — Anesthesia Procedure Notes (Signed)
 Procedure Name: Intubation Date/Time: 03/20/2024 7:55 AM  Performed by: Hali Marry, CRNAPre-anesthesia Checklist: Patient identified, Emergency Drugs available, Suction available and Patient being monitored Patient Re-evaluated:Patient Re-evaluated prior to induction Oxygen Delivery Method: Circle system utilized Preoxygenation: Pre-oxygenation with 100% oxygen Induction Type: IV induction Ventilation: Mask ventilation without difficulty Laryngoscope Size: Glidescope and 4 Grade View: Grade I Tube type: Oral Tube size: 7.5 mm Number of attempts: 1 Airway Equipment and Method: Stylet and Oral airway Placement Confirmation: ETT inserted through vocal cords under direct vision, positive ETCO2 and breath sounds checked- equal and bilateral Secured at: 22 cm Tube secured with: Tape Dental Injury: Teeth and Oropharynx as per pre-operative assessment

## 2024-03-20 NOTE — Anesthesia Postprocedure Evaluation (Signed)
 Anesthesia Post Note  Patient: Bobby Maxwell.  Procedure(s) Performed: LEFT REVERSE SHOULDER ARTHROPLASTY (Left: Shoulder)     Patient location during evaluation: PACU Anesthesia Type: General Level of consciousness: awake Pain management: pain level controlled Vital Signs Assessment: post-procedure vital signs reviewed and stable Respiratory status: spontaneous breathing, nonlabored ventilation and respiratory function stable Cardiovascular status: blood pressure returned to baseline and stable Postop Assessment: no apparent nausea or vomiting Anesthetic complications: no   No notable events documented.  Last Vitals:  Vitals:   03/20/24 1212 03/20/24 1237  BP:  106/66  Pulse: 65 65  Resp: 19 16  Temp: 36.5 C 36.5 C  SpO2: 95% 92%    Last Pain:  Vitals:   03/20/24 1321  TempSrc:   PainSc: 0-No pain                 Linton Rump

## 2024-03-20 NOTE — Anesthesia Procedure Notes (Signed)
 Anesthesia Regional Block: Interscalene brachial plexus block   Pre-Anesthetic Checklist: , timeout performed,  Correct Patient, Correct Site, Correct Laterality,  Correct Procedure, Correct Position, site marked,  Risks and benefits discussed,  Surgical consent,  Pre-op evaluation,  At surgeon's request and post-op pain management  Laterality: Left  Prep: chloraprep       Needles:  Injection technique: Single-shot  Needle Type: Echogenic Stimulator Needle     Needle Length: 9cm  Needle Gauge: 21     Additional Needles:   Procedures:,,,, ultrasound used (permanent image in chart),,    Narrative:  Start time: 03/20/2024 6:53 AM End time: 03/20/2024 6:56 AM Injection made incrementally with aspirations every 5 mL.  Performed by: Personally  Anesthesiologist: Linton Rump, MD  Additional Notes: Discussed risks and benefits of nerve block including, but not limited to, prolonged and/or permanent nerve injury involving sensory and/or motor function. Monitors were applied and a time-out was performed. The nerve and associated structures were visualized under ultrasound guidance. After negative aspiration, local anesthetic was slowly injected around the nerve. There was no evidence of high pressure during the procedure. There were no paresthesias. VSS remained stable and the patient tolerated the procedure well.

## 2024-03-20 NOTE — H&P (Signed)
 Bobby Hines Londyn Wotton. is an 78 y.o. male.   Chief Complaint: left shoulder pain HPI: Bobby Maxwell. is a 78 y.o. male who presents reporting left shoulder pain.  Since he was last seen has had a CT scan which is reviewed.  CT scan does show severe arthritis of the glenohumeral joint with mild arthropathy of the [facility] joint.  Glenoid vault dimensions are adequate for reverse shoulder replacement.  Is using tramadol episodically for pain.  He does have a friend at home.Marland Kitchen Describes night pain and rest pain and pain that interferes with ADLs.  Past Medical History:  Diagnosis Date   Arthritis    Diabetes mellitus without complication (HCC)    GERD (gastroesophageal reflux disease)    History of kidney stones 2021   Hyperlipidemia    Hypertension    T2DM (type 2 diabetes mellitus) [facility])     Past Surgical History:  Procedure Laterality Date   BACK SURGERY  1980's   lower   COLONOSCOPY N/A 07/09/2014   Procedure: COLONOSCOPY;  Surgeon: Shirley Friar, MD;  Location: [facility];  Service: Endoscopy;  Laterality: N/A;   COLONOSCOPY WITH PROPOFOL N/A 10/15/2018   Procedure: COLONOSCOPY WITH PROPOFOL;  Surgeon: Charlott Rakes, MD;  Location: [facility];  Service: Endoscopy;  Laterality: N/A;   HERNIA REPAIR  40 yrs ago   HOT HEMOSTASIS N/A 07/09/2014   Procedure: HOT HEMOSTASIS (ARGON PLASMA COAGULATION/BICAP);  Surgeon: Shirley Friar, MD;  Location: [facility];  Service: Endoscopy;  Laterality: N/A;   MASS EXCISION Left 11/30/2022   Procedure: EXCISION OF LEFT BREAST MASS;  Surgeon: Almond Lint, MD;  Location: [facility];  Service: General;  Laterality: Left;   POLYPECTOMY  10/15/2018   Procedure: POLYPECTOMY;  Surgeon: Charlott Rakes, MD;  Location: [facility];  Service: Endoscopy;;   REPLACEMENT TOTAL KNEE BILATERAL     TOTAL HIP ARTHROPLASTY Right 10/26/2020   Procedure: RIGHT TOTAL HIP ARTHROPLASTY ANTERIOR APPROACH;  Surgeon: Cammy Copa, MD;  Location: [facility] OR;  Service: Orthopedics;  Laterality: Right;   TOTAL KNEE ARTHROPLASTY Bilateral     Family History  Problem Relation Age of Onset   Diabetes Mother    Cancer Brother    Social History:  reports that he has quit smoking. His smokeless tobacco use includes snuff. He reports current alcohol use. He reports that he does not use drugs.  Allergies:  Allergies  Allergen Reactions   Ivp Dye [Iodinated Contrast Media] Anaphylaxis, Hives and Swelling    Medications Prior to Admission  Medication Sig Dispense Refill   aspirin EC 81 MG tablet Take 81 mg by mouth daily.     atorvastatin (LIPITOR) 20 MG tablet Take 20 mg by mouth daily.     cetirizine (ZYRTEC) 10 MG tablet Take 10 mg by mouth daily as needed for allergies.     Cholecalciferol (VITAMIN D3) 2000 units TABS Take 2,000 Units by mouth daily.     fluticasone (FLONASE) 50 MCG/ACT nasal spray Place 2 sprays into both nostrils daily as needed for allergies or rhinitis.     gabapentin (NEURONTIN) 100 MG capsule Take 1 capsule (100 mg total) by mouth 3 (three) times daily as needed. 90 capsule 0   glipiZIDE (GLUCOTROL XL) 5 MG 24 hr tablet Take 5 mg by mouth daily with breakfast.     lisinopril (ZESTRIL) 20 MG tablet Take 20 mg by mouth daily.     meloxicam (MOBIC) 15 MG tablet Take 15 mg by  mouth daily as needed for pain.     metFORMIN (GLUCOPHAGE) 500 MG tablet Take 2,000 mg by mouth daily with breakfast.     Multiple Vitamin (MULTIVITAMIN WITH MINERALS) TABS tablet Take 1 tablet by mouth daily.     tamsulosin (FLOMAX) 0.4 MG CAPS capsule Take 0.4 mg by mouth daily as needed (urinary retention).     HYDROcodone-acetaminophen (NORCO/VICODIN) 5-325 MG tablet Take 1 tablet by mouth every 6 (six) hours as needed for moderate pain. (Patient not taking: Reported on 03/07/2024) 10 tablet 0   methocarbamol (ROBAXIN) 500 MG tablet Take 1 tablet (500 mg total) by mouth every 8 (eight) hours as needed for muscle  spasms. (Patient not taking: Reported on 03/07/2024) 30 tablet 0   ondansetron (ZOFRAN) 4 MG tablet Take 1 tablet (4 mg total) by mouth every 8 (eight) hours as needed for nausea or vomiting. (Patient not taking: Reported on 03/07/2024) 15 tablet 0   predniSONE (STERAPRED UNI-PAK 21 TAB) 5 MG (21) TBPK tablet Take dosepak as directed (Patient not taking: Reported on 03/07/2024) 21 tablet 0   traMADol (ULTRAM) 50 MG tablet Take 1 tablet (50 mg total) by mouth every 6 (six) hours as needed. (Patient not taking: Reported on 03/07/2024) 30 tablet 0   traMADol (ULTRAM) 50 MG tablet Take 1 tablet (50 mg total) by mouth every 6 (six) hours as needed. (Patient not taking: Reported on 03/07/2024) 30 tablet 1    Results for orders placed or performed during the hospital encounter of 03/20/24 (from the past 48 hours)  Glucose, capillary     Status: Abnormal   Collection Time: 03/20/24  5:58 AM  Result Value Ref Range   Glucose-Capillary 168 (H) 70 - 99 mg/dL    Comment: Glucose reference range applies only to samples taken after fasting for at least 8 hours.   No results found.  Review of Systems  Musculoskeletal:  Positive for arthralgias.  All other systems reviewed and are negative.   Blood pressure 136/78, pulse (!) 55, temperature 98.4 F (36.9 C), temperature source Oral, resp. rate 18, height 5\' 5"  (1.651 m), weight 85.3 kg, SpO2 95%. Physical Exam Vitals reviewed.  HENT:     Head: Normocephalic.     Nose: Nose normal.     Mouth/Throat:     Mouth: Mucous membranes are moist.  Eyes:     Pupils: Pupils are equal, round, and reactive to light.  Cardiovascular:     Rate and Rhythm: Normal rate.     Pulses: Normal pulses.  Pulmonary:     Effort: Pulmonary effort is normal.  Abdominal:     General: Abdomen is flat.  Musculoskeletal:     Cervical back: Normal range of motion.  Skin:    General: Skin is warm.     Capillary Refill: Capillary refill takes less than 2 seconds.  Neurological:      General: No focal deficit present.     Mental Status: He is alert.  Psychiatric:        Mood and Affect: Mood normal.    Ortho exam demonstrates functional left deltoid. Radial pulse intact. No masses lymphadenopathy or skin changes noted in the shoulder region. Shoulder range of motion is 10/80/95. Motor or sensory function of the left hand is intact. Cervical spine range of motion intact with no paresthesias C5-T1.   Assessment/Plan Impression is end-stage left shoulder arthritis which is symptomatic. Has diminished range of motion and diminished functional ADLs. Pain at night. Risk and benefits of  reverse shoulder replacement are discussed with the patient including not limited to infection nerve vessel damage incomplete pain relief as well as incomplete restoration of function. The expected hospital course and rehabilitation course was also described. Patient understands the risk and benefits and wishes to proceed. All questions answered.   Burnard Bunting, MD 03/20/2024, 6:46 AM

## 2024-03-20 NOTE — Plan of Care (Signed)
 PT ALERT AND ORIENTED X4. SKIN WARM AND DRY. HOB ELEVATED. SLING ADJUSTED TO LT ARM/SHOULDER. MEDICATED X1 FOR C/O INDIGESTION. PIV INTACT AND PATENT. NO C/O PAIN TO LT SHOULDER AT THIS TIME. ASSISTANCE PROVIDED WITH REPOSITIONING. ENCOURAGED TO CALL FOR ASSISTANCE WITH ADLS. CALL LIGHT IN REACH. SR X3 ELEVATED. BED IN LOW POSITION.

## 2024-03-21 ENCOUNTER — Encounter (HOSPITAL_COMMUNITY): Payer: Self-pay | Admitting: Orthopedic Surgery

## 2024-03-21 ENCOUNTER — Other Ambulatory Visit: Payer: Self-pay

## 2024-03-21 DIAGNOSIS — M19012 Primary osteoarthritis, left shoulder: Secondary | ICD-10-CM | POA: Diagnosis not present

## 2024-03-21 MED ORDER — METHOCARBAMOL 500 MG PO TABS: 500.0000 mg | ORAL_TABLET | Freq: Three times a day (TID) | ORAL | 0 refills | Status: AC | PRN

## 2024-03-21 MED ORDER — ACETAMINOPHEN 325 MG PO TABS
325.0000 mg | ORAL_TABLET | Freq: Four times a day (QID) | ORAL | 0 refills | Status: AC | PRN
Start: 2024-03-21 — End: ?

## 2024-03-21 MED ORDER — DOCUSATE SODIUM 100 MG PO CAPS
100.0000 mg | ORAL_CAPSULE | Freq: Two times a day (BID) | ORAL | 0 refills | Status: AC
Start: 2024-03-21 — End: ?

## 2024-03-21 MED ORDER — OXYCODONE HCL 5 MG PO TABS
5.0000 mg | ORAL_TABLET | ORAL | 0 refills | Status: AC | PRN
Start: 2024-03-21 — End: ?

## 2024-03-21 MED ORDER — CELECOXIB 100 MG PO CAPS: 100.0000 mg | ORAL_CAPSULE | Freq: Two times a day (BID) | ORAL | 0 refills | Status: AC

## 2024-03-21 NOTE — Progress Notes (Signed)
 Reviewed and discussed discharge information with patient. Pt aware of activity restrictions as well as new medication schedule. Patient aware of follow up appointments. Teach back enforced. Pt with all belongings noted on admission present on discharge . All questions answered .

## 2024-03-21 NOTE — Progress Notes (Signed)
  Subjective: Patient stable.  Pain controlled but the block just now starting to wear off   Objective: Vital signs in last 24 hours: Temp:  [97.6 F (36.4 C)-98.7 F (37.1 C)] 98.7 F (37.1 C) (04/04 0748) Pulse Rate:  [57-82] 82 (04/04 0748) Resp:  [15-22] 16 (04/04 0748) BP: (96-130)/(59-97) 107/62 (04/04 0748) SpO2:  [91 %-100 %] 94 % (04/04 0748)  Intake/Output from previous day: 04/03 0701 - 04/04 0700 In: 1582.3 [I.V.:1300; IV Piggyback:282.3] Out: 400 [Urine:350; Blood:50] Intake/Output this shift: Total I/O In: 240 [P.O.:240] Out: -   Exam:  No cellulitis present Compartment soft  Labs: No results for input(s): "HGB" in the last 72 hours. No results for input(s): "WBC", "RBC", "HCT", "PLT" in the last 72 hours. No results for input(s): "NA", "K", "CL", "CO2", "BUN", "CREATININE", "GLUCOSE", "CALCIUM" in the last 72 hours. No results for input(s): "LABPT", "INR" in the last 72 hours.  Assessment/Plan: Plan at this time is occupational therapy this morning.  Okay to discharge home.  Okay to be in the bed without the sling.  However when he is up and walking I think it would be good for him to have the sling in place.  We will aim for home health physical therapy twice a week for 2 weeks and then start outpatient physical therapy after we see him back.  He will need a home exercise program from occupational therapy for pendulums and passive range of motion as well.   G Scott Carmin Alvidrez 03/21/2024, 8:11 AM

## 2024-03-21 NOTE — Care Management Obs Status (Signed)
 MEDICARE OBSERVATION STATUS NOTIFICATION   Patient Details  Name: Bobby Maxwell. MRN: [id number] Date of Birth: September 04, 1946   Medicare Observation Status Notification Given:       Harriet Masson, RN 03/21/2024, 10:19 AM

## 2024-03-21 NOTE — Progress Notes (Signed)
 Pt rested at intervals. Tolerated po meds and IV antibiotics. Urinating w/o difficulty. Sling in place to his lt shoulder/arm. Assistance provided with adls as needed. Call light in reach. Sr x2 elevated. Bed in low position.drs intact to lt shoulder.

## 2024-03-21 NOTE — Evaluation (Signed)
 Occupational Therapy Evaluation/Discharge Patient Details Name: Bobby Maxwell. MRN: [id number] DOB: 02-10-1946 Today's Date: 03/21/2024   History of Present Illness   78 y/o male S/P Left reverse shoulder replacement completed on 03/20/24. PMH includes DM2, HTN, bilateral TKA, L foot drop, and diverticulosis.     Clinical Impressions s/p shoulder replacement without functional use of LUE secondary to effects of surgery and interscalene block and shoulder precautions. Therapist provided education and instruction to patient and spouse in regards to exercises, precautions, positioning, donning upper extremity clothing and bathing while maintaining shoulder precautions, ice and edema management, use of ice machine and donning/doffing sling. Patient and spouse verbalized understanding and demonstrated as needed. Patient verbalized modified independence this AM to donn shirt, underwear, pants, socks and shoes. OT provided with instruction on compensatory strategies to perform ADLs. Patient to follow up with MD for further therapy needs.       If plan is discharge home, recommend the following:   Assist for transportation;Assistance with cooking/housework     Functional Status Assessment   Patient has had a recent decline in their functional status and demonstrates the ability to make significant improvements in function in a reasonable and predictable amount of time.     Equipment Recommendations   None recommended by OT      Precautions/Restrictions   Precautions Precautions: Shoulder Type of Shoulder Precautions: post op shoulder precautions. NWB LUE, No active shoulder ROM, OK A/ROM wrist, elbow, hand, pendulums ok Shoulder Interventions: Shoulder sling/immobilizer;Off for dressing/bathing/exercises (off when sitting in bed/recliner) Precaution Booklet Issued: Yes (comment) Recall of Precautions/Restrictions: Intact Precaution/Restrictions Comments: patient and  spouse provided verbal education regarding use of sling, UE positioning, WB restrictions, compensatory techniques during ADL tasks, and pain management techniques. Handout provided. Required Braces or Orthoses: Sling Restrictions Weight Bearing Restrictions Per Provider Order: Yes LUE Weight Bearing Per Provider Order: Non weight bearing     Mobility Bed Mobility    General bed mobility comments: up in chair upon therapy arrival.    Transfers Overall transfer level: Independent Equipment used: None       Balance Overall balance assessment: Independent         ADL either performed or assessed with clinical judgement   ADL Overall ADL's : Modified independent      General ADL Comments: Pt sitting up in chair and dressed in personal clothes upon therapy arrival. Reports that he dressed himself with some difficulty and increased time. Education provided on compensatory techniques during ADL tasks while maintaining shoulder precautions. Pt verbalized understanding.     Vision Baseline Vision/History: 0 No visual deficits Ability to See in Adequate Light: 0 Adequate Patient Visual Report: No change from baseline Vision Assessment?: No apparent visual deficits     Perception Perception: Within Functional Limits       Praxis Praxis: WFL       Pertinent Vitals/Pain Pain Assessment Pain Assessment: Faces Faces Pain Scale: Hurts a little bit Pain Location: left flank/rib pain verbalized Pain Descriptors / Indicators: Discomfort, Sore Pain Intervention(s): Limited activity within patient's tolerance, Monitored during session     Extremity/Trunk Assessment Upper Extremity Assessment Upper Extremity Assessment: Right hand dominant;LUE deficits/detail LUE Deficits / Details: recent shoulder surgery with restrictions in place. Nerve block still effective although gradually wearing off as patient is able to demonstrate active hand movement. Assist provided with UE positioning  in sling with education provided and handout referenced. LUE: Unable to fully assess due to immobilization LUE Sensation: decreased light touch LUE  Coordination: decreased fine motor;decreased gross motor   Lower Extremity Assessment Lower Extremity Assessment: Overall WFL for tasks assessed   Cervical / Trunk Assessment Cervical / Trunk Assessment: Normal   Communication Communication Communication: No apparent difficulties   Cognition Arousal: Alert Behavior During Therapy: WFL for tasks assessed/performed Cognition: No apparent impairments          Following commands: Intact       Cueing  General Comments      surgical dressing intact on Left shoulder. post op edema noted.   Exercises     Shoulder Instructions Shoulder Instructions Donning/doffing shirt without moving shoulder: Patient able to independently direct caregiver;Caregiver independent with task Method for sponge bathing under operated UE: Patient able to independently direct caregiver;Caregiver independent with task Donning/doffing sling/immobilizer: Patient able to independently direct caregiver;Caregiver independent with task Correct positioning of sling/immobilizer: Patient able to independently direct caregiver;Caregiver independent with task Pendulum exercises (written home exercise program): Caregiver independent with task;Patient able to independently direct caregiver ROM for elbow, wrist and digits of operated UE: Patient able to independently direct caregiver;Caregiver independent with task Sling wearing schedule (on at all times/off for ADL's): Patient able to independently direct caregiver;Caregiver independent with task Proper positioning of operated UE when showering: Patient able to independently direct caregiver;Caregiver independent with task Positioning of UE while sleeping: Patient able to independently direct caregiver;Caregiver independent with task    Home Living Family/patient expects  to be discharged to:: Private residence Living Arrangements: Spouse/significant other Available Help at Discharge: Family;Available PRN/intermittently Type of Home: House      Home Equipment: None          Prior Functioning/Environment Prior Level of Function : Independent/Modified Independent         OT Problem List: Impaired UE functional use;Decreased strength        OT Goals(Current goals can be found in the care plan section)   Acute Rehab OT Goals Patient Stated Goal: to go home OT Goal Formulation: All assessment and education complete, DC therapy   OT Frequency:   1X visit       AM-PAC OT "6 Clicks" Daily Activity     Outcome Measure Help from another person eating meals?: None Help from another person taking care of personal grooming?: None Help from another person toileting, which includes using toliet, bedpan, or urinal?: None Help from another person bathing (including washing, rinsing, drying)?: None Help from another person to put on and taking off regular upper body clothing?: None Help from another person to put on and taking off regular lower body clothing?: None 6 Click Score: 24   End of Session Equipment Utilized During Treatment: Other (comment) (sling) Nurse Communication: Mobility status;Other (comment) (ready for discharge)  Activity Tolerance: Patient tolerated treatment well Patient left: in chair;with call bell/phone within reach;with family/visitor present  OT Visit Diagnosis: Muscle weakness (generalized) (M62.81);Pain Pain - Right/Left: Left Pain - part of body: Arm                Time: 1610-9604 OT Time Calculation (min): 25 min Charges:  OT General Charges $OT Visit: 1 Visit OT Evaluation $OT Eval High Complexity: 1 High OT Treatments $Self Care/Home Management : 8-22 mins  Limmie Patricia, OTR/L,CBIS  Supplemental OT - [facility] and [facility] Secure Chat Preferred    Baylei Siebels, Charisse March 03/21/2024, 11:23 AM

## 2024-03-21 NOTE — TOC Transition Note (Signed)
 Transition of Care [facility]) - Discharge Note   Patient Details  Name: Bobby Maxwell. MRN: [id number] Date of Birth: 03-24-1946  Transition [facility] CM/SW Contact:  Harriet Masson, RN Phone Number: 03/21/2024, 12:34 PM   Clinical Narrative:    Patient stable for discharge.  This RNCM offered choice for Home  Health, Patient states he has no preference, RNCM made referral to [facility] with Frances Furbish, He is able to take referral.  Friend will transport home.     Final next level of care: Home w Home Health Services Barriers to Discharge: Barriers Resolved   Patient Goals and CMS Choice Patient states their goals for this hospitalization and ongoing recovery are:: return home CMS Medicare.gov Compare Post Acute Care list provided to:: Patient Choice offered to / list presented to : Patient      Discharge Placement             Home          Discharge Plan and Services Additional resources added to the After Visit Summary for                            [facility] Arranged: OT HH Agency: [facility] Date [facility] Agency Contacted: 03/21/24 Time HH Agency Contacted: 1100 Representative spoke with at [facility] Agency: Kandee Keen  Social Drivers of Health (SDOH) Interventions SDOH Screenings   Food Insecurity: No Food Insecurity (03/20/2024)  Housing: Low Risk  (03/20/2024)  Transportation Needs: No Transportation Needs (03/20/2024)  Utilities: Not At Risk (03/20/2024)  Financial Resource Strain: Low Risk  (12/18/2023)   Received from [facility]  Physical Activity: Insufficiently Active (12/18/2023)   Received from [facility]  Social Connections: Moderately Integrated (03/20/2024)  Stress: No Stress Concern Present (12/18/2023)   Received from [facility]  Tobacco Use: High Risk (03/20/2024)     Readmission Risk Interventions     No data to display

## 2024-03-28 ENCOUNTER — Telehealth: Payer: Self-pay

## 2024-03-28 NOTE — Telephone Encounter (Signed)
 Okay for full passive range of motion of the operative shoulder except no external rotation past 30 degrees and no range of motion behind his back.  No lifting with the operative arm.  No strengthening exercises aside from deltoid isometrics.

## 2024-03-28 NOTE — Telephone Encounter (Signed)
 I called and advised Bobby Maxwell. He asked for a written protocol. He had no fax number for this but asked [place] to fax it to [facility]. Also didn't have the fax for them

## 2024-03-28 NOTE — Telephone Encounter (Signed)
 Bobby Maxwell from [place] asking protocol for shoulder His callback# [phone]

## 2024-03-29 DIAGNOSIS — M19012 Primary osteoarthritis, left shoulder: Secondary | ICD-10-CM

## 2024-03-31 ENCOUNTER — Telehealth: Payer: Self-pay | Admitting: Orthopedic Surgery

## 2024-03-31 NOTE — Telephone Encounter (Signed)
 IC LM for [place], we do not have this in a written protocol form.  I am able to provide this to him in letter form, if he is ok with this. Waiting for [facility]

## 2024-03-31 NOTE — Telephone Encounter (Signed)
 No, leave the adhesive postop dressing on until follow-up visit in 3 days

## 2024-03-31 NOTE — Telephone Encounter (Signed)
 Patient called. He would like to know if he could take the bandage off? His cb# [phone]

## 2024-04-01 NOTE — Telephone Encounter (Signed)
Called and advised. Pt stated understanding

## 2024-04-03 ENCOUNTER — Other Ambulatory Visit (INDEPENDENT_AMBULATORY_CARE_PROVIDER_SITE_OTHER): Payer: Self-pay

## 2024-04-03 ENCOUNTER — Ambulatory Visit (INDEPENDENT_AMBULATORY_CARE_PROVIDER_SITE_OTHER): Payer: Medicare HMO | Admitting: Orthopedic Surgery

## 2024-04-03 ENCOUNTER — Encounter: Payer: Self-pay | Admitting: Orthopedic Surgery

## 2024-04-03 DIAGNOSIS — Z96612 Presence of left artificial shoulder joint: Secondary | ICD-10-CM

## 2024-04-03 DIAGNOSIS — M19012 Primary osteoarthritis, left shoulder: Secondary | ICD-10-CM

## 2024-04-03 NOTE — Progress Notes (Signed)
 Post-Op Visit Note   Patient: Bobby Maxwell.           Date of Birth: [birth year]           MRN: [id number] Visit Date: 04/03/2024 PCP: Macy Mis, MD   Assessment & Plan:  Chief Complaint:  Chief Complaint  Patient presents with   Left Shoulder - Routine Post Op    Left RSA 03/20/24   Visit Diagnoses:  1. Arthritis of left shoulder region   2. History of arthroplasty of left shoulder     Plan: Marios is a patient is now 2 weeks out left reverse shoulder replacement.  Patient has been doing well.  On exam his got range of motion of 25/80/110.  Deltoid fires.  Incision intact.  Plan at this time is a home exercise program which the patient prefers over physical therapy.  4-week return.  Discontinue sling.  No lifting left arm greater than 2 pounds.  No radiographs required on return.  Follow-Up Instructions: No follow-ups on file.   Orders:  Orders Placed This Encounter  Procedures   XR Shoulder Left   No orders of the defined types were placed in this encounter.   Imaging: No results found.  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) 10/09/2019   Benign prostatic hyperplasia 10/03/2019   Hypertension associated with diabetes (HCC) 10/03/2019   Type 2 diabetes mellitus without complication, without long-term current use of insulin (HCC) 10/03/2019   History of colonic polyps 10/15/2018   Coccyodynia 01/23/2017   Pain in right wrist 01/23/2017   Benign neoplasm of colon 07/09/2014   Past Medical History:  Diagnosis Date   Arthritis    Diabetes mellitus without  complication (HCC)    GERD (gastroesophageal reflux disease)    History of kidney stones 2021   Hyperlipidemia    Hypertension    T2DM (type 2 diabetes mellitus) (HCC)     Family History  Problem Relation Age of Onset   Diabetes Mother    Cancer Brother     Past Surgical History:  Procedure Laterality Date   BACK SURGERY  1980's   lower   COLONOSCOPY N/A 07/09/2014   Procedure: COLONOSCOPY;  Surgeon: Shirley Friar, MD;  Location: [facility];  Service: Endoscopy;  Laterality: N/A;   COLONOSCOPY WITH PROPOFOL N/A 10/15/2018   Procedure: COLONOSCOPY WITH PROPOFOL;  Surgeon: Charlott Rakes, MD;  Location: [facility];  Service: Endoscopy;  Laterality: N/A;   HERNIA REPAIR  40 yrs ago   HOT HEMOSTASIS N/A 07/09/2014   Procedure: HOT HEMOSTASIS (ARGON PLASMA COAGULATION/BICAP);  Surgeon: Shirley Friar, MD;  Location: [facility];  Service: Endoscopy;  Laterality: N/A;   MASS EXCISION Left 11/30/2022   Procedure: EXCISION OF LEFT BREAST MASS;  Surgeon: Almond Lint, MD;  Location: [facility];  Service: General;  Laterality: Left;   POLYPECTOMY  10/15/2018   Procedure: POLYPECTOMY;  Surgeon: Charlott Rakes, MD;  Location: [facility];  Service: Endoscopy;;   REPLACEMENT TOTAL KNEE BILATERAL     REVERSE SHOULDER ARTHROPLASTY Left 03/20/2024   Procedure: LEFT REVERSE SHOULDER ARTHROPLASTY;  Surgeon: Cammy Copa,  MD;  Location: [facility] OR;  Service: Orthopedics;  Laterality: Left;   TOTAL HIP ARTHROPLASTY Right 10/26/2020   Procedure: RIGHT TOTAL HIP ARTHROPLASTY ANTERIOR APPROACH;  Surgeon: Jasmine Mesi, MD;  Location[facility] OR;  Service: Orthopedics;  Laterality: Right;   TOTAL KNEE ARTHROPLASTY Bilateral    Social History   Occupational History   Not on file  Tobacco Use   Smoking status: Former   Smokeless tobacco: Current    Types: Snuff  Vaping Use   Vaping status: Never Used  Substance and Sexual Activity   Alcohol use: Yes     Comment: rare use   Drug use: No   Sexual activity: Yes

## 2024-04-07 ENCOUNTER — Telehealth: Payer: Self-pay

## 2024-04-07 NOTE — Telephone Encounter (Signed)
 Andree Kayser with [employer] called. States patient had SU  Left RSA 03/20/24 would like a CB to confirm/ clarify outpatient PT. I did read the last note for him but he needs more info. Please call him at [phone]. States he has only seen patient twice.

## 2024-04-07 NOTE — Telephone Encounter (Signed)
 Dr Rozelle Corning provided exercise sheet for patient to do HEP.  IC

## 2024-05-01 NOTE — Discharge Summary (Signed)
 Physician Discharge Summary      Patient ID: Bobby Maxwell. MRN: [id number] DOB/AGE: 08/23/1946 78 y.o.  Admit date: 03/20/2024 Discharge date 03/21/2024  Admission Diagnoses:  Principal Problem:   OA (osteoarthritis) of shoulder Active Problems:   S/P reverse total shoulder arthroplasty, left   Arthritis of left shoulder   Discharge Diagnoses:  Same  Surgeries: Procedure(s): LEFT REVERSE SHOULDER ARTHROPLASTY on 03/20/2024   Consultants:   Discharged Condition: Stable  Hospital Course: Bobby Maxwell. is an 78 y.o. male who was admitted 03/20/2024 with a chief complaint of left shoulder pain, and found to have a diagnosis of left shoulder arthritis.  They were brought to the operating room on 03/20/2024 and underwent the above named procedures.  Pt awoke from anesthesia without complication and was transferred to the floor. On POD1, patient's pain was controlled.  Able to ambulate without difficulty.  No red flag signs or symptoms.  Discharged home on POD 1..  Pt will f/u with Dr. Rozelle Corning in clinic in ~2 weeks.   Antibiotics given:  Anti-infectives (From admission, onward)    Start     Dose/Rate Route Frequency Ordered Stop   03/20/24 1400  ceFAZolin  (ANCEF ) IVPB 2g/100 mL premix        2 g 200 mL/hr over 30 Minutes Intravenous Every 8 hours 03/20/24 1232 03/21/24 1222   03/20/24 0826  vancomycin  (VANCOCIN ) powder  Status:  Discontinued          As needed 03/20/24 0827 03/20/24 1057   03/20/24 0600  ceFAZolin  (ANCEF ) IVPB 2g/100 mL premix        2 g 200 mL/hr over 30 Minutes Intravenous On call to O.R. 03/20/24 4098 03/20/24 0757     .  Recent vital signs:  Vitals:   03/21/24 0748 03/21/24 0828  BP: 107/62 107/62  Pulse: 82   Resp: 16   Temp: 98.7 F (37.1 C)   SpO2: 94%     Recent laboratory studies:  Results for orders placed or performed during the hospital encounter of 03/20/24  Glucose, capillary   Collection Time: 03/20/24  5:58 AM   Result Value Ref Range   Glucose-Capillary 168 (H) 70 - 99 mg/dL  Glucose, capillary   Collection Time: 03/20/24 11:04 AM  Result Value Ref Range   Glucose-Capillary 165 (H) 70 - 99 mg/dL    Discharge Medications:   Allergies as of 03/21/2024       Reactions   Ivp Dye [iodinated Contrast Media] Anaphylaxis, Hives, Swelling        Medication List     STOP taking these medications    HYDROcodone -acetaminophen  5-325 MG tablet Commonly known as: NORCO/VICODIN   meloxicam 15 MG tablet Commonly known as: MOBIC   predniSONE  5 MG (21) Tbpk tablet Commonly known as: STERAPRED UNI-PAK 21 TAB   traMADol  50 MG tablet Commonly known as: ULTRAM        TAKE these medications    acetaminophen  325 MG tablet Commonly known as: TYLENOL  Take 1-2 tablets (325-650 mg total) by mouth every 6 (six) hours as needed for mild pain (pain score 1-3) (or temp > 100.5).   aspirin  EC 81 MG tablet Take 81 mg by mouth daily.   atorvastatin 20 MG tablet Commonly known as: LIPITOR Take 20 mg by mouth daily.   celecoxib  100 MG capsule Commonly known as: CELEBREX  Take 1 capsule (100 mg total) by mouth 2 (two) times daily.   cetirizine 10 MG tablet Commonly known as: ZYRTEC Take  10 mg by mouth daily as needed for allergies.   docusate sodium  100 MG capsule Commonly known as: COLACE Take 1 capsule (100 mg total) by mouth 2 (two) times daily.   fluticasone 50 MCG/ACT nasal spray Commonly known as: FLONASE Place 2 sprays into both nostrils daily as needed for allergies or rhinitis.   gabapentin  100 MG capsule Commonly known as: NEURONTIN  Take 1 capsule (100 mg total) by mouth 3 (three) times daily as needed.   glipiZIDE  5 MG 24 hr tablet Commonly known as: GLUCOTROL  XL Take 5 mg by mouth daily with breakfast.   lisinopril  20 MG tablet Commonly known as: ZESTRIL  Take 20 mg by mouth daily.   metFORMIN  500 MG tablet Commonly known as: GLUCOPHAGE  Take 2,000 mg by mouth daily with  breakfast.   methocarbamol  500 MG tablet Commonly known as: ROBAXIN  Take 1 tablet (500 mg total) by mouth every 8 (eight) hours as needed for muscle spasms.   multivitamin with minerals Tabs tablet Take 1 tablet by mouth daily.   ondansetron  4 MG tablet Commonly known as: ZOFRAN  Take 1 tablet (4 mg total) by mouth every 8 (eight) hours as needed for nausea or vomiting.   oxyCODONE  5 MG immediate release tablet Commonly known as: Oxy IR/ROXICODONE  Take 1-2 tablets (5-10 mg total) by mouth every 4 (four) hours as needed for moderate pain (pain score 4-6) (pain score 4-6).   tamsulosin  0.4 MG Caps capsule Commonly known as: FLOMAX  Take 0.4 mg by mouth daily as needed (urinary retention).   Vitamin D3 50 MCG (2000 UT) Tabs Take 2,000 Units by mouth daily.        Diagnostic Studies: XR Shoulder Left Result Date: 04/03/2024 AP lateral outlet radiograph left shoulder reviewed.  Reverse shoulder prosthesis in good position alignment with no complicating features.  Shoulder is located.  No fracture..   Disposition: Discharge disposition: 01-Home or Self Care       Discharge Instructions     Call MD / Call 911   Complete by: As directed    If you experience chest pain or shortness of breath, CALL 911 and be transported to the hospital emergency room.  If you develope a fever above 101 F, pus (white drainage) or increased drainage or redness at the wound, or calf pain, call your surgeon's office.   Constipation Prevention   Complete by: As directed    Drink plenty of fluids.  Prune juice may be helpful.  You may use a stool softener, such as Colace (over the counter) 100 mg twice a day.  Use MiraLax (over the counter) for constipation as needed.   Diet - low sodium heart healthy   Complete by: As directed    Discharge instructions   Complete by: As directed    From Dr. Rozelle Corning  okay to shower dressing is waterproof/do pendulum exercises and passive range of motion exercises  daily as instructed by Occupational Therapy/no lifting with left arm//okay to be out of sling when resting in bed but when up and around and use the sling for the first 2 weeks//home health occupational therapy will come and work with you as well 2 times a week for the first 2 weeks   Face-to-face encounter (required for Medicare/Medicaid patients)   Complete by: As directed    I Marykay Snipes certify that this patient is under my care and that I, or a nurse practitioner or physician's assistant working with me, had a face-to-face encounter that meets the physician face-to-face  encounter requirements with this patient on 03/21/2024. The encounter with the patient was in whole, or in part for the following medical condition(s) which is the primary reason for home health care (List medical condition): Left shoulder arthritis status post shoulder replacement.  Patient needs occupational therapy 2 times a week for 2 weeks for passive range of motion for external rotation not more than 45 degrees and abduction and forward flexion as tolerated.  No strengthening yet but passive range of motion and active assisted range of motion plus a home exercise program would be good.  Thanks   The encounter with the patient was in whole, or in part, for the following medical condition, which is the primary reason for home health care: Left shoulder arthritis   I certify that, based on my findings, the following services are medically necessary home health services: Physical therapy   Reason for Medically Necessary Home Health Services: Therapy- Therapeutic Exercises to Increase Strength and Endurance   My clinical findings support the need for the above services: Pain interferes with ambulation/mobility   Further, I certify that my clinical findings support that this patient is homebound due to: Pain interferes with ambulation/mobility   Home Health   Complete by: As directed    To provide the following care/treatments: OT    Increase activity slowly as tolerated   Complete by: As directed    Post-operative opioid taper instructions:   Complete by: As directed    POST-OPERATIVE OPIOID TAPER INSTRUCTIONS: It is important to wean off of your opioid medication as soon as possible. If you do not need pain medication after your surgery it is ok to stop day one. Opioids include: Codeine, Hydrocodone (Norco, Vicodin), Oxycodone (Percocet, oxycontin ) and hydromorphone  amongst others.  Long term and even short term use of opiods can cause: Increased pain response Dependence Constipation Depression Respiratory depression And more.  Withdrawal symptoms can include Flu like symptoms Nausea, vomiting And more Techniques to manage these symptoms Hydrate well Eat regular healthy meals Stay active Use relaxation techniques(deep breathing, meditating, yoga) Do Not substitute Alcohol to help with tapering If you have been on opioids for less than two weeks and do not have pain than it is ok to stop all together.  Plan to wean off of opioids This plan should start within one week post op of your joint replacement. Maintain the same interval or time between taking each dose and first decrease the dose.  Cut the total daily intake of opioids by one tablet each day Next start to increase the time between doses. The last dose that should be eliminated is the evening dose.           Follow-up Information     Care, [facility] Follow up.   Specialty: Home Health Services Why: Home health has been arranged. they will contact you to schedule apt within 48 hrs post discharge Contact information: [address] [place] [address] [phone]                  Signed: Prentis Maxwell 05/01/2024, 1:05 PM

## 2024-05-05 ENCOUNTER — Encounter: Admitting: Surgical

## 2024-05-21 ENCOUNTER — Telehealth: Payer: Self-pay | Admitting: Physical Medicine and Rehabilitation

## 2024-05-21 ENCOUNTER — Other Ambulatory Visit: Payer: Self-pay | Admitting: Physical Medicine and Rehabilitation

## 2024-05-21 DIAGNOSIS — M5416 Radiculopathy, lumbar region: Secondary | ICD-10-CM

## 2024-05-21 NOTE — Telephone Encounter (Signed)
 Pt called requesting an appt for back injection. Last injection 01/09/23. Pt phone number is [phone].

## 2024-05-23 ENCOUNTER — Telehealth: Payer: Self-pay | Admitting: Physical Medicine and Rehabilitation

## 2024-05-23 NOTE — Telephone Encounter (Signed)
 Patient called and wants to get scheduled for back injection. CB#[phone]

## 2024-06-16 ENCOUNTER — Encounter: Admitting: Physical Medicine and Rehabilitation

## 2024-07-23 ENCOUNTER — Other Ambulatory Visit: Payer: Self-pay | Admitting: Gastroenterology

## 2024-07-29 NOTE — Progress Notes (Signed)
 Subjective   HPI:  Bobby Maxwell. is a 78 y.o.  male who presents to the office with:  Chief Complaint  Patient presents with  . Abrasion    Patient presents today with concerns of open wound on left elbow. Onset 1 week ago after fall. Patient concerned with how scrape is healing.  . Gaps In Care    Colorectal cancer screening// patient has a kit he states he will complete Diabetes eye exam// patient states office will call him to schedule. Tdap vaccine// advised to receive from pharmacy.   Patient presents today with concerns of a open abrasion that is on his left elbow.  He states that it occurred 1 week ago after a fall.  Patient states that he was having another episode of vomiting and diarrhea, which he reports has happened 5 times since May.  He has follow-up with his gastroenterology team and has been recently seen.  Recommendations for stool testing which he picked up the kit today.  He has a scheduled colonoscopy for September with a follow-up scheduled for 9/16 per his report he reports of vomiting and diarrhea stopped 1 week ago as well.  When he fell he reports no loss of consciousness.  He does believe that he hit his head but does not have any cuts or abrasions, bruising.  He denies any headaches, vision changes, slurred speech, facial droop, muscle weakness, numbness or tingling.  He reports that he has been able to ambulate.  He has good range of motion of his extremities.  The only area that he scraped was his left elbow which he utilized to catch himself.  He has been utilizing a topical OTC antibiotic but was concerned as the area was still open and weeping slightly.  He does not have any pain, redness.  He has not had any fever or systemic symptoms.  No chest pain, palpitations, shortness of breath.   PMH: Past medical history, Past surgical history, Social history, family history were reviewed as noted in EMR.  Pertinent for:  Past Medical History:  Diagnosis Date  .  Diabetes mellitus (*)   . Hypertension      Medications and allergies reviewed.  ROS: All systems were negative including constitutional, CV, respiratory, GI/GU except for those noted in the HPI.  Objective   Vital Signs: BP 108/62 (BP Location: Right Upper Arm, Patient Position: Sitting)   Pulse 85   Temp 97 F (36.1 C) (Temporal)   Resp 14   Ht 5' 7 (1.702 m)   Wt 195 lb 6.4 oz (88.6 kg)   SpO2 95%   BMI 30.60 kg/m   Wt Readings from Last 3 Encounters:  07/29/24 195 lb 6.4 oz (88.6 kg)  05/20/24 192 lb 3.2 oz (87.2 kg)  12/18/23 192 lb 12.8 oz (87.5 kg)   Physical Exam Vitals and nursing note reviewed.  Constitutional:      General: He is not in acute distress. HENT:     Head: Normocephalic and atraumatic. No raccoon eyes, abrasion or contusion.  Eyes:     General: Lids are normal.     Extraocular Movements: Extraocular movements intact.     Right eye: No nystagmus.     Left eye: No nystagmus.     Conjunctiva/sclera: Conjunctivae normal.     Pupils: Pupils are equal, round, and reactive to light.     Funduscopic exam:    Right eye: No hemorrhage or papilledema.        Left  eye: No hemorrhage or papilledema.  Cardiovascular:     Rate and Rhythm: Normal rate and regular rhythm.     Pulses:          Radial pulses are 2+ on the right side and 2+ on the left side.     Heart sounds: No murmur heard. Pulmonary:     Effort: Pulmonary effort is normal.     Breath sounds: Normal breath sounds. No wheezing.  Musculoskeletal:     Left upper arm: Normal.     Left elbow: No swelling. Normal range of motion. No tenderness.     Left forearm: No swelling, edema or tenderness.     Cervical back: Normal range of motion and neck supple. No rigidity. No pain with movement or spinous process tenderness. Normal range of motion.     Right lower leg: No edema.     Left lower leg: No edema.  Lymphadenopathy:     Cervical: No cervical adenopathy.  Skin:    General: Skin is warm  and dry.     Findings: Abrasion (Lateral aspect of left proximal forearm.  Small about open clear drainage noted.  No surrounding erythema, purulent material.  Granulated tissue is noted.) present.  Neurological:     Mental Status: He is alert and oriented to person, place, and time.     Cranial Nerves: No cranial nerve deficit or facial asymmetry.     Motor: No weakness.     Coordination: Romberg sign negative. Coordination normal. Finger-Nose-Finger Test and Heel to [facility] Test normal.     Gait: Gait is intact. Gait normal.     Deep Tendon Reflexes: Reflexes are normal and symmetric.  Psychiatric:        Mood and Affect: Mood and affect normal.        Cognition and Memory: Memory normal.        Judgment: Judgment normal.      Assessment/Plan   No orders of the defined types were placed in this encounter.   1. Fall, initial encounter   2. Abrasion of left upper extremity, initial encounter   3. Vomiting and diarrhea    Recent fall without syncope 1 week ago.  Occurred during an episode of nausea and vomiting and diarrhea.  Patient is currently working with his GI team for a solution regarding these episodic GI symptoms.  GI symptoms have since resolved 1 week ago.  Patient has not had any recurrent syncope episodes.  Neurological exam is intact.  I offered patient for the lab work but he declines today.  He did not experience any ACS symptoms.  Vital signs are stable today.  Recommended increased hydration, particularly during any GI symptoms.  Patient will follow-up if he has any return of symptoms.  He declines any further evaluation in office today.  He has a follow-up scheduled for 9//25 Well-healing.  He does have some minimal clear drainage.  It appears well granulated and healing well.  I will provide patient with mupirocin  to apply topically 3 times daily.  Patient will continue to keep area clean and dry.  He will follow-up if he notices any signs or symptoms of infection  developing.  He has good range of motion of his elbow so there is less suspicion for fracture or dislocation underlying. Continue management and workup as indicated by gastroenterology team.  Continue with colonoscopy as scheduled on 9/16.  No current symptoms today.  No problem-specific Assessment & Plan notes found for this encounter.  No follow-ups on file.  Future Appointments  Date Time Provider Department Center  08/21/2024 10:30 AM Lannie Gell, PA-C [facility] None    Medications at end of visit today: Current Medications[1]  Patient Care Team: Lannie Gell, PA-C as PCP - General (Physician Assistant) Suzon Saba, RN as Registered Nurse (Family Medicine)          [1]  Current Outpatient Medications:  .  acetaminophen  (TYLENOL ) 325 mg tablet, Take one tablet to two tablets (325-650 mg dose) by mouth every 6 (six) hours as needed., Disp: , Rfl:  .  Alcohol Swabs PADS, 1 pad by Does not apply route daily., Disp: 100 each, Rfl: 11 .  atorvastatin (LIPITOR) 20 mg tablet, Take one tablet (20 mg dose) by mouth daily., Disp: 90 tablet, Rfl: 1 .  Blood Glucose Monitoring Suppl (ONETOUCH ULTRA 2) w/Device KIT, Use to check blood sugar as directed, Disp: 1 kit, Rfl: 0 .  cetirizine (ZYRTEC) 10 mg tablet, Take one tablet (10 mg dose) by mouth daily., Disp: , Rfl:  .  empagliflozin (JARDIANCE) 10 mg TABS tablet, Take one tablet (10 mg dose) by mouth daily. (Patient not taking: Reported on 07/29/2024), Disp: 90 tablet, Rfl: 1 .  fluticasone propionate (FLONASE) 50 mcg/actuation nasal spray, USE 2 SPRAYS IN EACH NOSTRIL EVERY DAY . 3 MONTH MED CHECK FOLLOW-UP DUE JANUARY, Disp: 48 g, Rfl: 3 .  gabapentin  (NEURONTIN ) 300 mg capsule, TAKE 1 CAPSULE(300 MG) BY MOUTH AT BEDTIME AS NEEDED FOR PAIN, Disp: 90 capsule, Rfl: 2 .  glipiZIDE  ER (GLUCOTROL  XL) 10 mg 24 hr tablet, Take one tablet (10 mg dose) by mouth daily., Disp: 90 tablet, Rfl: 2 .  glucose blood (ACCU-CHEK GUIDE) test strip, TEST  BLOOD SUGAR ONCE DAILY, Disp: 100 each, Rfl: 3 .  lisinopril  (PRINIVIL ,ZESTRIL ) 20 mg tablet, TAKE 1 TABLET EVERY DAY, Disp: 90 tablet, Rfl: 3 .  meloxicam (MOBIC) 15 mg tablet, TAKE 1 TABLET EVERY DAY AS NEEDED FOR PAIN, Disp: 30 tablet, Rfl: 1 .  Multiple Vitamin (MULTIVITAMIN) tablet, Take one tablet by mouth daily., Disp: , Rfl:  .  mupirocin  (BACTROBAN ) 2 % ointment, Apply to affected area 3 times daily, Disp: 30 g, Rfl: 2 .  ondansetron  (ZOFRAN ) 4 mg tablet, Take one tablet (4 mg dose) by mouth every 8 (eight) hours as needed for Nausea., Disp: 20 tablet, Rfl: 0 .  ONETOUCH ULTRASOFT LANCETS lancets, USE AS DIRECTED TO CHECK BLOOD SUGAR ONCE DAILY, Disp: 100 each, Rfl: 6 .  tamsulosin  (FLOMAX ) 0.4 mg CAPS, TAKE 1 CAPSULE EVERY DAY, Disp: 90 capsule, Rfl: 3

## 2024-08-21 NOTE — Assessment & Plan Note (Signed)
 PSA was previously elevated 4.63 months ago.  Repeat PSA today.  No new LUTS per patient's report.  Reports compliance with his tamsulosin  0.4 mg daily.

## 2024-08-21 NOTE — Assessment & Plan Note (Signed)
 Patient with a history of colon polyps.  Continues to be overdue for his colonoscopy though it is scheduled for 9/16.  He has been working with his GI team in regards to his recurrence of vomiting and diarrhea episodes.  He has had 5 episodes since May.  These do not appear to be correlated with his use of metformin .  Patient reports that he had obtained stool culture testing but I do not have any results.  ROI was signed today for [facility].

## 2024-08-21 NOTE — Progress Notes (Signed)
 Subjective   HPI:  Bobby Maxwell. is a 78 y.o.  male who presents to the office with:  Chief Complaint  Patient presents with  . Diabetes    3 month DM follow up. Patient is scheduled for colonoscopy on 09/02/2024. ROI completed for [facility]   Diabetes Follow-up  Hemoglobin A1c  Date Value Ref Range Status  08/21/2024 7.8 (A) 4.8 - 5.6 % Final  05/20/2024 7.4 (A) 4.8 - 5.6 % Final  12/18/2023 7.8 (A) 4.8 - 5.6 % Final     Diabetic Medications     Biguanides     * metFORMIN  ER (GLUCOPHAGE -XR) 500 mg 24 hr tablet Take two tablets (1,000 mg dose) by mouth 2 (two) times a day with meals.     Sodium-Glucose Co-Transporter 2 (SGLT2) Inhibitors     * empagliflozin (JARDIANCE) 10 mg TABS tablet Take one tablet (10 mg dose) by mouth daily.     Sulfonylureas     * glipiZIDE  ER (GLUCOTROL  XL) 10 mg 24 hr tablet Take one tablet (10 mg dose) by mouth daily.       Health Maintenance  Topic Date Due  . Diabetes Eye Exam  05/22/2024  . Influenza Vaccine (1) 08/18/2024  . Diabetes Annual Microalbumin/Creatinine Ratio  09/16/2024  . Diabetes Foot Exam  09/16/2024  . Diabetes Hemoglobin A1C  11/20/2024  . Diabetes Lipid Profile  05/20/2025    Home glucometer readings are not performed.   Glucose levels are increasing steadily.    Compliance with: diabetic diet: noncompliant some of the time    medication:   compliant all of the time    exercise:  noncompliant much of the time Body mass index is 30.6 kg/m. Wt Readings from Last 3 Encounters:  08/21/24 195 lb 6.4 oz (88.6 kg)  07/29/24 195 lb 6.4 oz (88.6 kg)  05/20/24 192 lb 3.2 oz (87.2 kg)    Diabetes symptoms:  no polyuria or polydipsia, no chest pain, dyspnea or TIAs, no numbness, tingling or pain in extremities  Known diabetic complications:  retinopathy      PMH: Past medical history, Past surgical history, Social history, family history were reviewed as noted in EMR.  Pertinent for:  Past Medical History:   Diagnosis Date  . Diabetes mellitus (*)   . Hypertension      Medications and allergies reviewed.  ROS: All systems were negative including constitutional, CV, respiratory, GI/GU except for those noted in the HPI.  Objective   Vital Signs: BP 129/77 (BP Location: Left Upper Arm, Patient Position: Sitting)   Pulse 63   Temp 97.9 F (36.6 C) (Temporal)   Resp 18   Ht 5' 7 (1.702 m)   Wt 195 lb 6.4 oz (88.6 kg)   SpO2 97%   BMI 30.60 kg/m   Wt Readings from Last 3 Encounters:  08/21/24 195 lb 6.4 oz (88.6 kg)  07/29/24 195 lb 6.4 oz (88.6 kg)  05/20/24 192 lb 3.2 oz (87.2 kg)   Physical Exam Vitals and nursing note reviewed.  Constitutional:      General: He is not in acute distress. HENT:     Head: Normocephalic and atraumatic.  Cardiovascular:     Rate and Rhythm: Normal rate and regular rhythm.     Pulses:          Radial pulses are 2+ on the right side and 2+ on the left side.     Heart sounds: No murmur heard. Pulmonary:  Effort: Pulmonary effort is normal.     Breath sounds: Normal breath sounds. No wheezing.  Musculoskeletal:     Right lower leg: No edema.     Left lower leg: No edema.  Skin:    General: Skin is warm and dry.  Neurological:     Mental Status: He is alert.  Psychiatric:        Mood and Affect: Mood and affect normal.        Cognition and Memory: Memory normal.        Judgment: Judgment normal.      Assessment/Plan   Orders Placed This Encounter  Procedures  . PSA  . Albumin /Creatinine Ratio, Random Urine  . POCT A1C    1. 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 Hypertension associated with diabetes (*)   3. Benign prostatic hyperplasia, unspecified whether lower urinary tract symptoms present   4. History of colonic polyps   5. Vomiting and diarrhea     Controlled type 2 diabetes mellitus  with both eyes affected by mild nonproliferative retinopathy without macular edema, without long-term current use of insulin   (*)  A1c unfortunately increased again to 7.8%.  At last office visit, patient reported stopping his metformin  due to concerns of GI side effects.  Today he reports that he has been taking his metformin  compliantly 2000 mg daily.  Patient also has been taking his glipizide  10 mg daily.  Previously Bernadine was reportedly too expensive so he did not continue use.  I tried to send in an alternative Januvia but he states that this was more expensive.  In office today, patient expresses that he is willing to try Jardiance again as this was manageable with his budget.  I encouraged him to contact his insurance company to see if there is better SGLT2 or GLP-1 therapies that are covered.  He will do so and report back to me as soon as possible.  We will otherwise follow-up again in 3 months for recheck of his A1c.  Hypertension associated with diabetes  (*)  Blood pressure well-controlled today.  Continue with current regimen.  Benign prostatic hyperplasia  PSA was previously elevated 4.63 months ago.  Repeat PSA today.  No new LUTS per patient's report.  Reports compliance with his tamsulosin  0.4 mg daily.  History of colonic polyps  Patient with a history of colon polyps.  Continues to be overdue for his colonoscopy though it is scheduled for 9/16.  He has been working with his GI team in regards to his recurrence of vomiting and diarrhea episodes.  He has had 5 episodes since May.  These do not appear to be correlated with his use of metformin .  Patient reports that he had obtained stool culture testing but I do not have any results.  ROI was signed today for [facility].  5.  See A&P for history of colon polyps  Follow up in about 3 months (around 11/20/2024) for DM f/u.  Future Appointments  Date Time Provider Department Center  11/20/2024  9:00 AM Lannie Gell, PA-C [facility] None     Medications at end of visit today: Current Medications[1]  Patient Care Team: Lannie Gell, PA-C as PCP - General (Physician Assistant) Suzon Saba, RN as Registered Nurse (Family Medicine)          [1]  Current Outpatient Medications:  .  acetaminophen  (TYLENOL ) 325  mg tablet, Take one tablet to two tablets (325-650 mg dose) by mouth every 6 (six) hours as needed., Disp: , Rfl:  .  Alcohol Swabs PADS, 1 pad by Does not apply route daily., Disp: 100 each, Rfl: 11 .  atorvastatin (LIPITOR) 20 mg tablet, Take one tablet (20 mg dose) by mouth daily., Disp: 90 tablet, Rfl: 1 .  Blood Glucose Monitoring Suppl (ONETOUCH ULTRA 2) w/Device KIT, Use to check blood sugar as directed, Disp: 1 kit, Rfl: 0 .  cetirizine (ZYRTEC) 10 mg tablet, Take one tablet (10 mg dose) by mouth daily., Disp: , Rfl:  .  empagliflozin (JARDIANCE) 10 mg TABS tablet, Take one tablet (10 mg dose) by mouth daily., Disp: 90 tablet, Rfl: 1 .  fluticasone propionate (FLONASE) 50 mcg/actuation nasal spray, USE 2 SPRAYS IN EACH NOSTRIL EVERY DAY . 3 MONTH MED CHECK FOLLOW-UP DUE JANUARY, Disp: 48 g, Rfl: 3 .  gabapentin  (NEURONTIN ) 300 mg capsule, TAKE 1 CAPSULE(300 MG) BY MOUTH AT BEDTIME AS NEEDED FOR PAIN, Disp: 90 capsule, Rfl: 2 .  glipiZIDE  ER (GLUCOTROL  XL) 10 mg 24 hr tablet, Take one tablet (10 mg dose) by mouth daily., Disp: 90 tablet, Rfl: 2 .  glucose blood (ACCU-CHEK GUIDE) test strip, TEST BLOOD SUGAR ONCE DAILY, Disp: 100 each, Rfl: 3 .  lisinopril  (PRINIVIL ,ZESTRIL ) 20 mg tablet, TAKE 1 TABLET EVERY DAY, Disp: 90 tablet, Rfl: 3 .  meloxicam (MOBIC) 15 mg tablet, TAKE 1 TABLET EVERY DAY AS NEEDED FOR PAIN, Disp: 30 tablet, Rfl: 1 .  metFORMIN  ER (GLUCOPHAGE -XR) 500 mg 24 hr tablet, Take two tablets (1,000 mg dose) by mouth 2 (two) times a day with meals., Disp: , Rfl:  .  Multiple Vitamin (MULTIVITAMIN) tablet, Take one tablet by mouth daily., Disp: , Rfl:  .  mupirocin  (BACTROBAN ) 2 % ointment, Apply  to affected area 3 times daily, Disp: 30 g, Rfl: 2 .  ondansetron  (ZOFRAN ) 4 mg tablet, Take one tablet (4 mg dose) by mouth every 8 (eight) hours as needed for Nausea., Disp: 20 tablet, Rfl: 0 .  ONETOUCH ULTRASOFT LANCETS lancets, USE AS DIRECTED TO CHECK BLOOD SUGAR ONCE DAILY, Disp: 100 each, Rfl: 6 .  tamsulosin  (FLOMAX ) 0.4 mg CAPS, TAKE 1 CAPSULE EVERY DAY, Disp: 90 capsule, Rfl: 3 *Some images could not be shown.

## 2024-08-21 NOTE — Assessment & Plan Note (Signed)
 Blood pressure well-controlled today.  Continue with current regimen.

## 2024-08-21 NOTE — Assessment & Plan Note (Signed)
 A1c unfortunately increased again to 7.8%.  At last office visit, patient reported stopping his metformin  due to concerns of GI side effects.  Today he reports that he has been taking his metformin  compliantly 2000 mg daily.  Patient also has been taking his glipizide  10 mg daily.  Previously Bernadine was reportedly too expensive so he did not continue use.  I tried to send in an alternative Januvia but he states that this was more expensive.  In office today, patient expresses that he is willing to try Jardiance again as this was manageable with his budget.  I encouraged him to contact his insurance company to see if there is better SGLT2 or GLP-1 therapies that are covered.  He will do so and report back to me as soon as possible.  We will otherwise follow-up again in 3 months for recheck of his A1c.

## 2024-08-26 ENCOUNTER — Encounter (HOSPITAL_COMMUNITY): Payer: Self-pay | Admitting: Gastroenterology

## 2024-08-26 NOTE — Progress Notes (Signed)
 Attempted to obtain medical history for pre op call via telephone, unable to reach at this time. HIPAA compliant voicemail message left requesting return call to pre surgical testing department.

## 2024-09-02 ENCOUNTER — Ambulatory Visit (HOSPITAL_COMMUNITY)
Admission: RE | Admit: 2024-09-02 | Discharge: 2024-09-02 | Disposition: A | Discharge status: Home / Self Care | Attending: Gastroenterology | Admitting: Gastroenterology

## 2024-09-02 ENCOUNTER — Encounter (HOSPITAL_COMMUNITY)
Admission: RE | Disposition: A | Discharge status: Home / Self Care | Payer: Self-pay | Source: Home / Self Care | Attending: Gastroenterology

## 2024-09-02 ENCOUNTER — Ambulatory Visit (HOSPITAL_COMMUNITY): Admitting: Anesthesiology

## 2024-09-02 DIAGNOSIS — K5790 Diverticulosis of intestine, part unspecified, without perforation or abscess without bleeding: Secondary | ICD-10-CM | POA: Insufficient documentation

## 2024-09-02 DIAGNOSIS — Z539 Procedure and treatment not carried out, unspecified reason: Secondary | ICD-10-CM | POA: Insufficient documentation

## 2024-09-02 DIAGNOSIS — E669 Obesity, unspecified: Secondary | ICD-10-CM | POA: Insufficient documentation

## 2024-09-02 DIAGNOSIS — Z7984 Long term (current) use of oral hypoglycemic drugs: Secondary | ICD-10-CM | POA: Diagnosis not present

## 2024-09-02 DIAGNOSIS — K219 Gastro-esophageal reflux disease without esophagitis: Secondary | ICD-10-CM | POA: Insufficient documentation

## 2024-09-02 DIAGNOSIS — R197 Diarrhea, unspecified: Secondary | ICD-10-CM | POA: Diagnosis present

## 2024-09-02 DIAGNOSIS — M1990 Unspecified osteoarthritis, unspecified site: Secondary | ICD-10-CM | POA: Insufficient documentation

## 2024-09-02 DIAGNOSIS — E119 Type 2 diabetes mellitus without complications: Secondary | ICD-10-CM | POA: Insufficient documentation

## 2024-09-02 DIAGNOSIS — E785 Hyperlipidemia, unspecified: Secondary | ICD-10-CM | POA: Insufficient documentation

## 2024-09-02 DIAGNOSIS — Z87442 Personal history of urinary calculi: Secondary | ICD-10-CM | POA: Diagnosis not present

## 2024-09-02 DIAGNOSIS — Z87891 Personal history of nicotine dependence: Secondary | ICD-10-CM | POA: Insufficient documentation

## 2024-09-02 DIAGNOSIS — I10 Essential (primary) hypertension: Secondary | ICD-10-CM | POA: Insufficient documentation

## 2024-09-02 DIAGNOSIS — I493 Ventricular premature depolarization: Secondary | ICD-10-CM | POA: Diagnosis not present

## 2024-09-02 DIAGNOSIS — Z860100 Personal history of colon polyps, unspecified: Secondary | ICD-10-CM | POA: Diagnosis not present

## 2024-09-02 SURGERY — CANCELLED PROCEDURE
Anesthesia: Monitor Anesthesia Care

## 2024-09-02 NOTE — Progress Notes (Signed)
 Patient arrived for outpatient colonoscopy. He reports eating solids foods for breakfast and lunch yesterday. States he drank 3/4 of bowel prep stopping at 0130. Reports stool is liquid/clear with brown balls of stool. Informed Dr. Dianna of this, verbal order to cancel procedure due to inadequate prep and patient did not follow instructions. Patient informed of this, he will contact Dr. Dianna office to discuss need to reschedule.

## 2024-09-02 NOTE — Anesthesia Preprocedure Evaluation (Addendum)
 Anesthesia Evaluation  Patient identified by MRN, date of birth, ID band Patient awake    Reviewed: Allergy & Precautions, NPO status , Patient's Chart, lab work & pertinent test results  History of Anesthesia Complications (+) history of anesthetic complications  Airway Mallampati: III  TM Distance: >3 FB Neck ROM: Full    Dental no notable dental hx. (+) Dental Advisory Given, Teeth Intact   Pulmonary neg shortness of breath, neg sleep apnea, neg COPD, neg recent URI, former smoker   Pulmonary exam normal breath sounds clear to auscultation       Cardiovascular hypertension, Pt. on medications (-) angina (-) Past MI, (-) Cardiac Stents and (-) CABG Normal cardiovascular exam+ dysrhythmias (PVCs)  Rhythm:Regular Rate:Normal  EKG 03/11/24 Sinus bradycardia with occasional Premature ventricular complexes   Neuro/Psych negative neurological ROS  negative psych ROS   GI/Hepatic Neg liver ROS,GERD  Medicated,,Diverticulosis  Diarrhea Hx/o Colon polyps   Endo/Other  diabetes, Well Controlled, Type 2, Oral Hypoglycemic Agents  HLD  Renal/GU Renal diseaseHx/o renal calculi  negative genitourinary   Musculoskeletal  (+) Arthritis , Osteoarthritis,    Abdominal  (+) + obese  Peds  Hematology negative hematology ROS (+) Lab Results      Component                Value               Date                      WBC                      6.4                 03/11/2024                HGB                      14.5                03/11/2024                HCT                      43.4                03/11/2024                MCV                      93.1                03/11/2024                PLT                      229                 03/11/2024              Anesthesia Other Findings   Reproductive/Obstetrics                              Anesthesia Physical Anesthesia Plan  ASA: 3  Anesthesia  Plan: MAC   Post-op Pain Management: Minimal or no pain anticipated   Induction: Intravenous  PONV Risk Score  and Plan: 2 and Ondansetron , Dexamethasone  and Treatment may vary due to age or medical condition  Airway Management Planned: Natural Airway, Nasal Cannula and Simple Face Mask  Additional Equipment: None  Intra-op Plan:   Post-operative Plan:   Informed Consent: I have reviewed the patients History and Physical, chart, labs and discussed the procedure including the risks, benefits and alternatives for the proposed anesthesia with the patient or authorized representative who has indicated his/her understanding and acceptance.     Dental advisory given  Plan Discussed with: CRNA and Anesthesiologist  Anesthesia Plan Comments: ( )        Anesthesia Quick Evaluation
